# Patient Record
Sex: Male | Born: 1940 | Race: White | Hispanic: No | Marital: Married | Smoking: Former smoker
Health system: Southern US, Community
[De-identification: ages and names within clinical notes are randomized; demographics above are authoritative.]

## PROBLEM LIST (undated history)

## (undated) DIAGNOSIS — R7301 Impaired fasting glucose: Secondary | ICD-10-CM

## (undated) DIAGNOSIS — I4819 Other persistent atrial fibrillation: Secondary | ICD-10-CM

## (undated) DIAGNOSIS — K759 Inflammatory liver disease, unspecified: Secondary | ICD-10-CM

## (undated) DIAGNOSIS — G4730 Sleep apnea, unspecified: Secondary | ICD-10-CM

## (undated) DIAGNOSIS — K648 Other hemorrhoids: Secondary | ICD-10-CM

## (undated) DIAGNOSIS — D126 Benign neoplasm of colon, unspecified: Secondary | ICD-10-CM

## (undated) DIAGNOSIS — E785 Hyperlipidemia, unspecified: Secondary | ICD-10-CM

## (undated) DIAGNOSIS — M1990 Unspecified osteoarthritis, unspecified site: Secondary | ICD-10-CM

## (undated) DIAGNOSIS — I10 Essential (primary) hypertension: Secondary | ICD-10-CM

## (undated) HISTORY — DX: Essential (primary) hypertension: I10

## (undated) HISTORY — DX: Other persistent atrial fibrillation: I48.19

## (undated) HISTORY — DX: Benign neoplasm of colon, unspecified: D12.6

## (undated) HISTORY — DX: Other hemorrhoids: K64.8

## (undated) HISTORY — DX: Hyperlipidemia, unspecified: E78.5

## (undated) HISTORY — DX: Sleep apnea, unspecified: G47.30

## (undated) HISTORY — DX: Impaired fasting glucose: R73.01

---

## 1995-04-22 HISTORY — PX: NASAL SINUS SURGERY: SHX719

## 1995-04-22 HISTORY — PX: TONSILLECTOMY AND ADENOIDECTOMY: SUR1326

## 1995-04-22 HISTORY — PX: UVULOPALATOPHARYNGOPLASTY: SHX827

## 2002-06-21 ENCOUNTER — Emergency Department (HOSPITAL_COMMUNITY): Admission: EM | Admit: 2002-06-21 | Discharge: 2002-06-22 | Payer: Self-pay | Admitting: Emergency Medicine

## 2002-06-30 ENCOUNTER — Emergency Department (HOSPITAL_COMMUNITY): Admission: EM | Admit: 2002-06-30 | Discharge: 2002-06-30 | Payer: Self-pay | Admitting: *Deleted

## 2002-07-01 ENCOUNTER — Encounter: Payer: Self-pay | Admitting: Internal Medicine

## 2002-07-01 ENCOUNTER — Encounter: Admission: RE | Admit: 2002-07-01 | Discharge: 2002-07-01 | Payer: Self-pay | Admitting: Internal Medicine

## 2004-04-21 DIAGNOSIS — R7301 Impaired fasting glucose: Secondary | ICD-10-CM

## 2004-04-21 DIAGNOSIS — D126 Benign neoplasm of colon, unspecified: Secondary | ICD-10-CM

## 2004-04-21 HISTORY — PX: COLONOSCOPY W/ POLYPECTOMY: SHX1380

## 2004-04-21 HISTORY — DX: Benign neoplasm of colon, unspecified: D12.6

## 2004-04-21 HISTORY — DX: Impaired fasting glucose: R73.01

## 2004-04-30 ENCOUNTER — Ambulatory Visit: Payer: Self-pay | Admitting: Internal Medicine

## 2004-05-08 ENCOUNTER — Ambulatory Visit: Payer: Self-pay | Admitting: Internal Medicine

## 2004-05-29 ENCOUNTER — Ambulatory Visit: Payer: Self-pay | Admitting: Gastroenterology

## 2004-06-12 ENCOUNTER — Ambulatory Visit: Payer: Self-pay | Admitting: Gastroenterology

## 2005-09-17 ENCOUNTER — Ambulatory Visit: Payer: Self-pay | Admitting: Internal Medicine

## 2005-09-29 ENCOUNTER — Ambulatory Visit: Payer: Self-pay | Admitting: Internal Medicine

## 2006-11-09 ENCOUNTER — Telehealth (INDEPENDENT_AMBULATORY_CARE_PROVIDER_SITE_OTHER): Payer: Self-pay | Admitting: *Deleted

## 2007-02-02 ENCOUNTER — Ambulatory Visit: Payer: Self-pay | Admitting: Internal Medicine

## 2007-02-02 DIAGNOSIS — R7989 Other specified abnormal findings of blood chemistry: Secondary | ICD-10-CM | POA: Insufficient documentation

## 2007-02-02 DIAGNOSIS — N402 Nodular prostate without lower urinary tract symptoms: Secondary | ICD-10-CM

## 2007-02-14 LAB — CONVERTED CEMR LAB
ALT: 18 units/L (ref 0–53)
AST: 21 units/L (ref 0–37)
Albumin: 4.4 g/dL (ref 3.5–5.2)
BUN: 20 mg/dL (ref 6–23)
Basophils Absolute: 0 10*3/uL (ref 0.0–0.1)
Basophils Relative: 0.3 % (ref 0.0–1.0)
Bilirubin, Direct: 0.1 mg/dL (ref 0.0–0.3)
CO2: 30 meq/L (ref 19–32)
Calcium: 9.3 mg/dL (ref 8.4–10.5)
Chloride: 106 meq/L (ref 96–112)
Cholesterol: 190 mg/dL (ref 0–200)
Creatinine, Ser: 1.1 mg/dL (ref 0.4–1.5)
Eosinophils Absolute: 0.1 10*3/uL (ref 0.0–0.6)
Eosinophils Relative: 2.5 % (ref 0.0–5.0)
GFR calc Af Amer: 86 mL/min
GFR calc non Af Amer: 71 mL/min
Glucose, Bld: 115 mg/dL — ABNORMAL HIGH (ref 70–99)
HCT: 45.1 % (ref 39.0–52.0)
HDL: 45.2 mg/dL (ref >39.0)
Hgb A1c MFr Bld: 5.6 % (ref 4.6–6.0)
LDL Cholesterol: 126 mg/dL — ABNORMAL HIGH (ref 0–99)
Lymphocytes Relative: 30.6 % (ref 12.0–46.0)
MCHC: 34.1 g/dL (ref 30.0–36.0)
MCV: 94.9 fL (ref 78.0–100.0)
Monocytes Relative: 10 % (ref 3.0–11.0)
Neutro Abs: 3.3 10*3/uL (ref 1.4–7.7)
Neutrophils Relative %: 56.6 % (ref 43.0–77.0)
Platelets: 240 10*3/uL (ref 150–400)
Potassium: 4.3 meq/L (ref 3.5–5.1)
RBC: 4.75 M/uL (ref 4.22–5.81)
TSH: 1.01 microintl units/mL (ref 0.35–5.50)
Total Bilirubin: 0.9 mg/dL (ref 0.3–1.2)
Total CHOL/HDL Ratio: 4.2
Triglycerides: 93 mg/dL (ref 0–149)
VLDL: 19 mg/dL (ref 0–40)
WBC: 5.8 10*3/uL (ref 4.5–10.5)

## 2007-02-16 ENCOUNTER — Encounter (INDEPENDENT_AMBULATORY_CARE_PROVIDER_SITE_OTHER): Payer: Self-pay | Admitting: *Deleted

## 2007-02-21 ENCOUNTER — Encounter: Payer: Self-pay | Admitting: Internal Medicine

## 2007-02-22 ENCOUNTER — Encounter: Payer: Self-pay | Admitting: Internal Medicine

## 2007-02-24 ENCOUNTER — Telehealth: Payer: Self-pay | Admitting: Internal Medicine

## 2007-03-02 ENCOUNTER — Ambulatory Visit: Payer: Self-pay | Admitting: Internal Medicine

## 2007-03-04 ENCOUNTER — Encounter: Payer: Self-pay | Admitting: Internal Medicine

## 2007-06-07 ENCOUNTER — Ambulatory Visit: Payer: Self-pay | Admitting: Internal Medicine

## 2007-06-14 LAB — CONVERTED CEMR LAB
ALT: 23 units/L (ref 0–53)
AST: 22 units/L (ref 0–37)
Cholesterol: 142 mg/dL (ref 0–200)
HDL: 40.9 mg/dL (ref >39.0)
LDL Cholesterol: 84 mg/dL (ref 0–99)
Total CHOL/HDL Ratio: 3.5
Triglycerides: 85 mg/dL (ref 0–149)

## 2007-06-15 ENCOUNTER — Encounter (INDEPENDENT_AMBULATORY_CARE_PROVIDER_SITE_OTHER): Payer: Self-pay | Admitting: *Deleted

## 2007-07-14 ENCOUNTER — Encounter: Payer: Self-pay | Admitting: Internal Medicine

## 2007-08-26 ENCOUNTER — Encounter: Payer: Self-pay | Admitting: Internal Medicine

## 2007-10-12 ENCOUNTER — Ambulatory Visit: Payer: Self-pay | Admitting: Internal Medicine

## 2008-03-02 ENCOUNTER — Encounter: Payer: Self-pay | Admitting: Internal Medicine

## 2008-04-21 HISTORY — PX: PROSTATE BIOPSY: SHX241

## 2008-06-04 ENCOUNTER — Emergency Department (HOSPITAL_BASED_OUTPATIENT_CLINIC_OR_DEPARTMENT_OTHER): Admission: EM | Admit: 2008-06-04 | Discharge: 2008-06-04 | Payer: Self-pay | Admitting: Emergency Medicine

## 2008-09-11 ENCOUNTER — Encounter: Payer: Self-pay | Admitting: Internal Medicine

## 2008-10-05 ENCOUNTER — Ambulatory Visit: Payer: Self-pay | Admitting: Internal Medicine

## 2008-10-07 LAB — CONVERTED CEMR LAB
ALT: 22 units/L (ref 0–53)
AST: 19 units/L (ref 0–37)
Cholesterol: 157 mg/dL (ref 0–200)
HDL: 42.7 mg/dL (ref >39.00)
LDL Cholesterol: 97 mg/dL (ref 0–99)
Total CHOL/HDL Ratio: 4
Triglycerides: 88 mg/dL (ref 0.0–149.0)
VLDL: 17.6 mg/dL (ref 0.0–40.0)

## 2008-10-09 ENCOUNTER — Encounter (INDEPENDENT_AMBULATORY_CARE_PROVIDER_SITE_OTHER): Payer: Self-pay | Admitting: *Deleted

## 2008-10-10 ENCOUNTER — Ambulatory Visit: Payer: Self-pay | Admitting: Internal Medicine

## 2008-10-10 DIAGNOSIS — E785 Hyperlipidemia, unspecified: Secondary | ICD-10-CM

## 2008-10-10 DIAGNOSIS — I10 Essential (primary) hypertension: Secondary | ICD-10-CM | POA: Insufficient documentation

## 2008-10-10 DIAGNOSIS — Z86010 Personal history of colonic polyps: Secondary | ICD-10-CM

## 2008-10-10 LAB — CONVERTED CEMR LAB
Cholesterol, target level: 200 mg/dL
HDL goal, serum: 40 mg/dL
LDL Goal: 130 mg/dL

## 2008-10-11 ENCOUNTER — Encounter: Payer: Self-pay | Admitting: Internal Medicine

## 2009-03-06 ENCOUNTER — Encounter: Payer: Self-pay | Admitting: Internal Medicine

## 2009-04-21 HISTORY — PX: COLONOSCOPY: SHX174

## 2009-05-01 ENCOUNTER — Encounter (INDEPENDENT_AMBULATORY_CARE_PROVIDER_SITE_OTHER): Payer: Self-pay | Admitting: *Deleted

## 2009-05-21 ENCOUNTER — Encounter (INDEPENDENT_AMBULATORY_CARE_PROVIDER_SITE_OTHER): Payer: Self-pay | Admitting: *Deleted

## 2009-06-12 ENCOUNTER — Encounter (INDEPENDENT_AMBULATORY_CARE_PROVIDER_SITE_OTHER): Payer: Self-pay | Admitting: *Deleted

## 2009-06-14 ENCOUNTER — Ambulatory Visit: Payer: Self-pay | Admitting: Gastroenterology

## 2009-06-28 ENCOUNTER — Ambulatory Visit: Payer: Self-pay | Admitting: Gastroenterology

## 2009-11-05 ENCOUNTER — Ambulatory Visit: Payer: Self-pay | Admitting: Radiology

## 2009-11-05 ENCOUNTER — Emergency Department (HOSPITAL_BASED_OUTPATIENT_CLINIC_OR_DEPARTMENT_OTHER): Admission: EM | Admit: 2009-11-05 | Discharge: 2009-11-05 | Payer: Self-pay | Admitting: Emergency Medicine

## 2009-11-06 ENCOUNTER — Ambulatory Visit (HOSPITAL_BASED_OUTPATIENT_CLINIC_OR_DEPARTMENT_OTHER): Admission: RE | Admit: 2009-11-06 | Discharge: 2009-11-06 | Payer: Self-pay | Admitting: Orthopedic Surgery

## 2009-11-08 ENCOUNTER — Ambulatory Visit: Payer: Self-pay | Admitting: Internal Medicine

## 2010-01-14 ENCOUNTER — Ambulatory Visit: Payer: Self-pay | Admitting: Internal Medicine

## 2010-02-04 ENCOUNTER — Ambulatory Visit: Payer: Self-pay | Admitting: Internal Medicine

## 2010-03-06 ENCOUNTER — Encounter: Payer: Self-pay | Admitting: Internal Medicine

## 2010-03-29 ENCOUNTER — Encounter: Payer: Self-pay | Admitting: Internal Medicine

## 2010-03-29 ENCOUNTER — Ambulatory Visit: Payer: Self-pay | Admitting: Internal Medicine

## 2010-04-01 LAB — CONVERTED CEMR LAB
Basophils Absolute: 0 10*3/uL (ref 0.0–0.1)
Basophils Relative: 1 % (ref 0–1)
Eosinophils Absolute: 0.2 10*3/uL (ref 0.0–0.7)
Eosinophils Relative: 4 % (ref 0–5)
HCT: 41.5 % (ref 39.0–52.0)
Hemoglobin: 14.1 g/dL (ref 13.0–17.0)
MCHC: 34 g/dL (ref 30.0–36.0)
MCV: 92.8 fL (ref 78.0–100.0)
Monocytes Absolute: 0.6 10*3/uL (ref 0.1–1.0)
Platelets: 220 10*3/uL (ref 150–400)
RDW: 12.4 % (ref 11.5–15.5)

## 2010-05-19 LAB — CONVERTED CEMR LAB
ALT: 18 units/L (ref 0–53)
Albumin: 4.5 g/dL (ref 3.5–5.2)
Alkaline Phosphatase: 62 units/L (ref 39–117)
BUN: 27 mg/dL — ABNORMAL HIGH (ref 6–23)
Basophils Absolute: 0 10*3/uL (ref 0.0–0.1)
Bilirubin Urine: NEGATIVE
Bilirubin, Direct: 0.1 mg/dL (ref 0.0–0.3)
Blood in Urine, dipstick: NEGATIVE
CO2: 29 meq/L (ref 19–32)
Calcium: 9.3 mg/dL (ref 8.4–10.5)
Chloride: 104 meq/L (ref 96–112)
Cholesterol: 170 mg/dL (ref 0–200)
Creatinine, Ser: 1.2 mg/dL (ref 0.4–1.5)
Eosinophils Absolute: 0.1 10*3/uL (ref 0.0–0.7)
Eosinophils Relative: 2.6 % (ref 0.0–5.0)
GFR calc non Af Amer: 61.51 mL/min (ref >60)
Glucose, Bld: 95 mg/dL (ref 70–99)
Glucose, Urine, Semiquant: NEGATIVE
HCT: 42.1 % (ref 39.0–52.0)
HDL: 48.1 mg/dL (ref >39.00)
Hemoglobin: 14.3 g/dL (ref 13.0–17.0)
Ketones, urine, test strip: NEGATIVE
LDL Cholesterol: 110 mg/dL — ABNORMAL HIGH (ref 0–99)
Lymphocytes Relative: 51.8 % — ABNORMAL HIGH (ref 12.0–46.0)
MCHC: 34 g/dL (ref 30.0–36.0)
Monocytes Absolute: 0.6 10*3/uL (ref 0.1–1.0)
Neutro Abs: 1.2 10*3/uL — ABNORMAL LOW (ref 1.4–7.7)
Neutrophils Relative %: 29.6 % — ABNORMAL LOW (ref 43.0–77.0)
Nitrite: NEGATIVE
Platelets: 202 10*3/uL (ref 150.0–400.0)
Potassium: 4.3 meq/L (ref 3.5–5.1)
Protein, U semiquant: NEGATIVE
RBC: 4.28 M/uL (ref 4.22–5.81)
RDW: 13 % (ref 11.5–14.6)
Sodium: 139 meq/L (ref 135–145)
Specific Gravity, Urine: >=1.03
TSH: 1.3 microintl units/mL (ref 0.35–5.50)
Total Bilirubin: 0.7 mg/dL (ref 0.3–1.2)
Total CHOL/HDL Ratio: 4
Total Protein: 7 g/dL (ref 6.0–8.3)
Triglycerides: 60 mg/dL (ref 0.0–149.0)
Urobilinogen, UA: NEGATIVE
VLDL: 12 mg/dL (ref 0.0–40.0)
WBC Urine, dipstick: NEGATIVE
pH: 5

## 2010-05-21 NOTE — Assessment & Plan Note (Signed)
Summary: PNEUMONIA SHOT///SPH   Nurse Visit  CC: Pneumo vacc./kb   Allergies: No Known Drug Allergies  Immunizations Administered:  Pneumonia Vaccine:    Vaccine Type: Pneumovax    Site: left deltoid    Mfr: Merck    Dose: 0.5 ml    Route: IM    Given by: Lucious Groves CMA    Exp. Date: 07/08/2011    Lot #: [id number]    VIS given: 03/26/09 version given February 04, 2010.  Orders Added: 1)  Pneumococcal Vaccine [90732] 2)  Admin 1st Vaccine [[address]]

## 2010-05-21 NOTE — Procedures (Signed)
Summary: Colonoscopy  Patient: Daniel Tucker Note: All result statuses are Final unless otherwise noted.  Tests: (1) Colonoscopy (COL)   COL Colonoscopy           DONE     [facility]     [address]     [place], [place]  [address]           COLONOSCOPY PROCEDURE REPORT           PATIENT:  Daniel Tucker, Daniel Tucker  MR#:  [id number]     BIRTHDATE:  [birth year], 69 yrs. old  GENDER:  male           ENDOSCOPIST:  Judie Petit T. Russella Dar, MD, [facility]           PROCEDURE DATE:  06/28/2009     PROCEDURE:  Higher-risk screening colonoscopy G0105           ASA CLASS:  Class II     INDICATIONS:  1) follow-up of polyp, adenomatous polyp, 2006.     Screening.           MEDICATIONS:   Fentanyl 50 mcg IV, Versed 4 mg IV           DESCRIPTION OF PROCEDURE:   After the risks benefits and     alternatives of the procedure were thoroughly explained, informed     consent was obtained.  Digital rectal exam was performed and     revealed no abnormalities.   The LB [id number]-[id number] endoscope     was introduced through the anus and advanced to the cecum, which     was identified by both the appendix and ileocecal valve, without     limitations.  The quality of the prep was excellent, using     MoviPrep.  The instrument was then slowly withdrawn as the colon     was fully examined.     <<PROCEDUREIMAGES>>           FINDINGS:  A normal appearing cecum, ileocecal valve, and     appendiceal orifice were identified. The ascending, hepatic     flexure, transverse, splenic flexure, descending, sigmoid colon,     and rectum appeared unremarkable. Retroflexed views in the rectum     revealed no abnormalities.  The time to cecum =  1.75  minutes.     The scope was then withdrawn (time =  8.5  min) from the patient     and the procedure completed.           COMPLICATIONS:  None           ENDOSCOPIC IMPRESSION:     1) Normal colon           RECOMMENDATIONS:     1) Repeat Colonoscopy in 5 years.             Venita Lick. Russella Dar, MD, [facility]           CC: Pecola Lawless, MD           n.     Rosalie DoctorVenita Lick. Stark at 06/28/2009 11:13 AM           Edd Fabian, [id number]  Note: An exclamation mark (!) indicates a result that was not dispersed into the flowsheet. Document Creation Date: 06/28/2009 11:13 AM _______________________________________________________________________  (1) Order result status: Final Collection or observation date-time: 06/28/2009 11:10 Requested date-time:  Receipt date-time:  Reported date-time:  Referring Physician:   Ordering Physician: Claudette Head (  [id number]) Specimen Source:  Source: Launa Grill Order Number: [id number] Lab site:   Appended Document: Colonoscopy    Clinical Lists Changes  Observations: Added new observation of COLONNXTDUE: 06/2014 (06/28/2009 12:40)

## 2010-05-21 NOTE — Assessment & Plan Note (Signed)
Summary: YEARLY EXAM/flu shot/MAY COME IN FASTING///SPH   Vital Signs:  Patient profile:   70 year old male Height:      68 inches Weight:      194.6 pounds BMI:     29.70 Temp:     97.6 degrees F oral Pulse rate:   56 / minute Resp:     14 per minute BP sitting:   120 / 82  (left arm) Cuff size:   large  Vitals Entered By: Shonna Chock CMA (January 14, 2010 1:46 PM) CC: CPX with fasting labs , Lipid Management   CC:  CPX with fasting labs  and Lipid Management.  History of Present Illness: Here for Medicare AWV: 1.Risk factors based on Past M, S, F history:HTN; Dyslidemia ( Chart updated) 2.Physical Activities: yardwork, golf, stationary bike 2-3X/week 3.Depression/mood: no issues 4.Hearing:whisper heard @ 6 ft  5.ADL's: no limitations 6.Fall Risk: no risks; home safety proofed 7.Home Safety: no issues 8.Height, weight, &visual acuity:wall chart read @ 6 ft w/o lenses 9.Counseling: none requested  10.Labs ordered based on risk factors: see Orders 11. Referral Coordination: none requested 12. Care Plan: see Instructions 13. Cognitive Assessment: Oriented X3; memory & recall intact   ; "WORLD" spelled backwards; mood & affect normal. Hyperlipidemia Follow-Up      This is a 70 year old man who presents for Hyperlipidemia follow-up.  The patient denies muscle aches, GI upset, abdominal pain, flushing, itching, constipation, diarrhea, and fatigue.  The patient denies the following symptoms: chest pain/pressure, exercise intolerance, dypsnea, palpitations, syncope, and pedal edema.  Compliance with medications (by patient report) has been near 100%.  Dietary compliance has been good.  Adjunctive measures currently used by the patient include fish oil supplements.   Hypertension Follow-Up      The patient also presents for Hypertension follow-up.  The patient denies lightheadedness, urinary frequency, and headaches.  Compliance with medications (by patient report) has been near  100%.  Adjunctive measures currently used by the patient include salt restriction. BP @ home @ goal.   Lipid Management History:      Positive NCEP/ATP III risk factors include male age 13 years old or older and hypertension.  Negative NCEP/ATP III risk factors include non-diabetic, no family history for ischemic heart disease, non-tobacco-user status, no ASHD (atherosclerotic heart disease), no prior stroke/TIA, no peripheral vascular disease, and no history of aortic aneurysm.     Preventive Screening-Counseling & Management  Alcohol-Tobacco     Alcohol drinks/day: rarely     Smoking Status: quit     Packs/Day: up to 1 ppd     Year Quit: 1970  Caffeine-Diet-Exercise     Caffeine use/day: 1-2 cups/ day     Diet Comments: no specific diet  Hep-HIV-STD-Contraception     Dental Visit-last 6 months yes     Sun Exposure-Excessive: no  Safety-Violence-Falls     Seat Belt Use: yes     Firearms in the Home: firearms in the home     Firearm Counseling: not indicated; uses recommended firearm safety measures      Blood Transfusions:  no.        Travel History:  [place] in 10/2009.    Allergies (verified): No Known Drug Allergies  Past History:  Past Medical History: Sleep apnea, S/P ENT surgery (no CPAP required) Hyperlipidemia: [place] Study LDL goal = < 130. Hypertension Colonic polyps, PMH  of Prostate nodule, Dr  Vernie Ammons  Past Surgical History: Sleep apnea surgery with resolution , Dr  Bland Span Sinus surgery ,Tonsillectomy,  palate & uvula surgery as part of Apnea surgery Colon polypectomy 2006,negative   2011 Prostate biopsy negative , Dr Vernie Ammons  Family History: Father: prostate cancer, HTN Mother: HTN Siblings: bro: prostate cancer ; bro:  prostate nodule  Social History: no diet Retired Married Packs/Day:  up to 1 ppd Caffeine use/day:  1-2 cups/ day Dental Care w/in 6 mos.:  yes Sun Exposure-Excessive:  no Risk analyst Use:  yes Blood Transfusions:   no  Review of Systems  The patient denies anorexia, fever, hoarseness, prolonged cough, hemoptysis, melena, hematochezia, severe indigestion/heartburn, hematuria, suspicious skin lesions, unusual weight change, abnormal bleeding, enlarged lymph nodes, and angioedema.         Weight loss of 10 # on purpose. He sees Dr Vernie Ammons annually for prostate nodule & because of FH.  Physical Exam  General:  well-nourished,alert,appropriate and cooperative throughout examination Head:  Normocephalic and atraumatic without obvious abnormalities. No  significant alopecia Eyes:  No corneal or conjunctival inflammation noted.Perrla. Funduscopic exam benign, without hemorrhages, exudates or papilledema.  Ears:  External ear exam shows no significant lesions or deformities.  Otoscopic examination reveals clear canals, tympanic membranes are intact bilaterally without bulging, retraction, inflammation or discharge. Hearing is grossly normal bilaterally. Nose:  External nasal examination shows no deformity or inflammation. Nasal mucosa are pink and moist without lesions or exudates. Mouth:  Oral mucosa and oropharynx without lesions or exudates.  Teeth in good repair.S/Porophyngeal surgery changes posteriorly Neck:  No deformities, masses, or tenderness noted. Lungs:  Normal respiratory effort, chest expands symmetrically. Lungs are clear to auscultation, no crackles or wheezes. Heart:  regular rhythm, no murmur, no gallop, no rub, no JVD, no HJR, and bradycardia.  Click @ apex Abdomen:  Bowel sounds positive,abdomen soft and non-tender without masses, organomegaly or hernias noted. Genitalia:  Dr Vernie Ammons Prostate:  Dr Vernie Ammons Msk:  No deformity or scoliosis noted of thoracic or lumbar spine.   Pulses:  R and L carotid,radial,dorsalis pedis and posterior tibial pulses are full and equal bilaterally Extremities:  No clubbing, cyanosis, edema, or deformity noted with normal full range of motion of all joints.   Onycholysis R fingernails Neurologic:  alert & oriented X3 and DTRs symmetrical and normal.   Skin:  Intact without suspicious lesions or rashes Cervical Nodes:  No lymphadenopathy noted Axillary Nodes:  No palpable lymphadenopathy Psych:  memory intact for recent and remote, normally interactive, and good eye contact.     Impression & Recommendations:  Problem # 1:  PREVENTIVE HEALTH CARE (ICD-V70.0)  Orders: [facility] -Subsequent Annual Wellness Visit [phone])  Problem # 2:  HYPERTENSION (ICD-401.9)  Controlled His updated medication list for this problem includes:    Norvasc 5 Mg Tabs (Amlodipine besylate) .Marland Kitchen... 1 by mouth once daily    Metoprolol Tartrate 50 Mg Tabs (Metoprolol tartrate) .Marland Kitchen... Take 1/2 twice daily    Lisinopril 10 Mg Tabs (Lisinopril) .Marland Kitchen... 1 once daily if bp averages > 135/85  Orders: EKG w/ Interpretation (93000) Venipuncture (47829) TLB-BMP (Basic Metabolic Panel-BMET) (80048-METABOL)  Problem # 3:  HYPERLIPIDEMIA (ICD-272.4)  His updated medication list for this problem includes:    Pravachol 20 Mg Tabs (Pravastatin sodium) .Marland Kitchen... 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 1 by mouth once daily 2)  Acyclovir 400 Mg Tabs (Acyclovir) .... Take 1 tablet 3)  Metoprolol Tartrate 50 Mg Tabs (Metoprolol tartrate) .... Take 1/2 twice daily 4)  Vit C 1000mg  Qd  5)  Centrum Silver  6)  Glucosamine Sulfate  .... On three months and off three months 7)  Pravachol 20 Mg Tabs (Pravastatin sodium) .Marland Kitchen.. 1 at  bedtime 8)  Lisinopril 10 Mg Tabs (Lisinopril) .Marland Kitchen.. 1 once daily if bp averages > 135/85  Other Orders: Flu Vaccine 30yrs + MEDICARE PATIENTS (U0454) Administration Flu vaccine - MCR (G0008)  Lipid Assessment/Plan:      Based on NCEP/ATP III, the patient's risk factor category is "2 or more risk factors and a calculated 10 year CAD risk of < 20%".  The patient's lipid goals are as follows: Total cholesterol goal is 200; LDL cholesterol goal is 130; HDL cholesterol goal is 40; Triglyceride goal is 150.  His LDL cholesterol goal has been met.    Patient Instructions: 1)  It is important that you exercise regularly at least 20 minutes 5 times a week. If you develop chest pain, have severe difficulty breathing, or feel very tired , stop exercising immediately and seek medical attention. 2)  Take an  81 mg coated Aspirin every day. 3)  Check your Blood Pressure regularly. If it is above: 135/85 ON AVERAGE  you should make an appointment. Flu Vaccine Consent Questions     Do you have a history of severe allergic reactions to this vaccine? no    Any prior history of allergic reactions to egg and/or gelatin? no    Do you have a sensitivity to the preservative Thimersol? no    Do you have a past history of Guillan-Barre Syndrome? no    Do you currently have an acute febrile illness? no    Have you ever had a severe reaction to latex? no    Vaccine information given and explained to patient? yes    Are you currently pregnant? no    Lot Number:[id number]   Exp Date:10/19/2010   Site Given  Left Deltoid IM-CCC] Prescriptions: LISINOPRIL 10 MG TABS (LISINOPRIL) 1 once daily if BP AVERAGES > 135/85  #90 x 3   Entered and Authorized by:   Marga Melnick MD   Signed by:   Marga Melnick MD on 01/14/2010   Method used:   Print then Give to Patient   RxID:   [id number] PRAVACHOL 20 MG  TABS (PRAVASTATIN SODIUM) 1 at bedtime  #90 x 3   Entered and Authorized by:   Marga Melnick MD   Signed by:    Marga Melnick MD on 01/14/2010   Method used:   Print then Give to Patient   RxID:   [id number] METOPROLOL TARTRATE 50 MG  TABS (METOPROLOL TARTRATE) take 1/2 twice daily  #90 x 3   Entered and Authorized by:   Marga Melnick MD   Signed by:   Marga Melnick MD on 01/14/2010   Method used:   Print then Give to Patient   RxID:   [id number] ACYCLOVIR 400 MG  TABS (ACYCLOVIR) take 1 tablet  #90 x 3   Entered and Authorized by:   Marga Melnick MD   Signed by:   Marga Melnick MD on 01/14/2010   Method used:   Print then Give to Patient  RxID:   [id number] NORVASC 5 MG TABS (AMLODIPINE BESYLATE) 1 by mouth once daily  #90 x 3   Entered and Authorized by:   Marga Melnick MD   Signed by:   Marga Melnick MD on 01/14/2010   Method used:   Print then Give to Patient   RxID:   [id number]   .lbmedflu    Laboratory Results   Urine Tests   Date/Time Reported: January 14, 2010 2:09 PM   Routine Urinalysis   Color: yellow Appearance: Clear Glucose: negative   (Normal Range: Negative) Bilirubin: negative   (Normal Range: Negative) Ketone: negative   (Normal Range: Negative) Spec. Gravity: >=1.030   (Normal Range: 1.003-1.035) Blood: negative   (Normal Range: Negative) pH: 5.0   (Normal Range: 5.0-8.0) Protein: negative   (Normal Range: Negative) Urobilinogen: negative   (Normal Range: 0-1) Nitrite: negative   (Normal Range: Negative) Leukocyte Esterace: negative   (Normal Range: Negative)    Comments: Floydene Flock  January 14, 2010 2:09 PM

## 2010-05-21 NOTE — Letter (Signed)
Summary: [facility] Instructions  [facility]  [address] [place], [place] [address]   Phone: [phone]  Fax: [phone]       Daniel Tucker    [birth year]    MRN: [id number]        Procedure Day /Date:  Thursday  06/28/09     Arrival Time:  9:30am     Procedure Time:  10:30am     Location of Procedure:                    _X _  [facility] (4th Floor)                        PREPARATION FOR COLONOSCOPY WITH MOVIPREP   Starting 5 days prior to your procedure   Saturday 03/05  do not eat nuts, seeds, popcorn, corn, beans, peas,  salads, or any raw vegetables.  Do not take any fiber supplements (e.g. Metamucil, Citrucel, and Benefiber).  THE DAY BEFORE YOUR PROCEDURE         DATE:   03/09   DAY: Wednesday  1.  Drink clear liquids the entire day-NO SOLID FOOD  2.  Do not drink anything colored red or purple.  Avoid juices with pulp.  No orange juice.  3.  Drink at least 64 oz. (8 glasses) of fluid/clear liquids during the day to prevent dehydration and help the prep work efficiently.  CLEAR LIQUIDS INCLUDE: Water Jello Ice Popsicles Tea (sugar ok, no milk/cream) Powdered fruit flavored drinks Coffee (sugar ok, no milk/cream) Gatorade Juice: apple, white grape, white cranberry  Lemonade Clear bullion, consomm, broth Carbonated beverages (any kind) Strained chicken noodle soup Hard Candy                             4.  In the morning, mix first dose of MoviPrep solution:    Empty 1 Pouch A and 1 Pouch B into the disposable container    Add lukewarm drinking water to the top line of the container. Mix to dissolve    Refrigerate (mixed solution should be used within 24 hrs)  5.  Begin drinking the prep at 5:00 p.m. The MoviPrep container is divided by 4 marks.   Every 15 minutes drink the solution down to the next mark (approximately 8 oz) until the full liter is complete.   6.  Follow completed prep with 16 oz of clear liquid of  your choice (Nothing red or purple).  Continue to drink clear liquids until bedtime.  7.  Before going to bed, mix second dose of MoviPrep solution:    Empty 1 Pouch A and 1 Pouch B into the disposable container    Add lukewarm drinking water to the top line of the container. Mix to dissolve    Refrigerate  THE DAY OF YOUR PROCEDURE      DATE:  03/10  DAY: Thursday  Beginning at  5:30 a.m. (5 hours before procedure):         1. Every 15 minutes, drink the solution down to the next mark (approx 8 oz) until the full liter is complete.  2. Follow completed prep with 16 oz. of clear liquid of your choice.    3. You may drink clear liquids until  8:30am  (2 HOURS BEFORE PROCEDURE).   MEDICATION INSTRUCTIONS  Unless otherwise instructed, you should take regular prescription medications with a  small sip of water   as early as possible the morning of your procedure.           OTHER INSTRUCTIONS  You will need a responsible adult at least 69 years of age to accompany you and drive you home.   This person must remain in the waiting room during your procedure.  Wear loose fitting clothing that is easily removed.  Leave jewelry and other valuables at home.  However, you may wish to bring a book to read or  an iPod/MP3 player to listen to music as you wait for your procedure to start.  Remove all body piercing jewelry and leave at home.  Total time from sign-in until discharge is approximately 2-3 hours.  You should go home directly after your procedure and rest.  You can resume normal activities the  day after your procedure.  The day of your procedure you should not:   Drive   Make legal decisions   Operate machinery   Drink alcohol   Return to work  You will receive specific instructions about eating, activities and medications before you leave.    The above instructions have been reviewed and explained to me by   Wyona Almas RN  June 14, 2009 10:13  AM     I fully understand and can verbalize these instructions _____________________________ Date _________

## 2010-05-21 NOTE — Miscellaneous (Signed)
Summary: [facility] Previsit/prep  Clinical Lists Changes  Medications: Added new medication of MOVIPREP 100 GM  SOLR (PEG-KCL-NACL-NASULF-NA ASC-C) As per prep instructions. - Signed Rx of MOVIPREP 100 GM  SOLR (PEG-KCL-NACL-NASULF-NA ASC-C) As per prep instructions.;  #1 x 0;  Signed;  Entered by: Wyona Almas RN;  Authorized by: Meryl Dare MD [facility];  Method used: Electronically to [facility] [address], [place], [place], [place]  [address], Ph: [phone], Fax: [phone] Observations: Added new observation of ALLERGY REV: Done (06/14/2009 9:23) Added new observation of NKA: T (06/14/2009 9:23)    Prescriptions: MOVIPREP 100 GM  SOLR (PEG-KCL-NACL-NASULF-NA ASC-C) As per prep instructions.  #1 x 0   Entered by:   Wyona Almas RN   Authorized by:   Meryl Dare MD [facility]   Signed by:   Wyona Almas RN on 06/14/2009   Method used:   Electronically to        [facility] (retail)       [address]       [place], [place]  [address]       Ph: [phone]       Fax: [phone]   RxID:   [id number]

## 2010-05-21 NOTE — Letter (Signed)
Summary: Colonoscopy Letter  [facility]  [address] [place], [place] [address]   Phone: [phone]  Fax: [phone]      May 01, 2009 MRN: [id number]   VEGAS FRITZE [address] [place], [place]  [address]   Dear Mr. Shimp,   According to your medical record, it is time for you to schedule a Colonoscopy. The American Cancer Society recommends this procedure as a method to detect early colon cancer. Patients with a family history of colon cancer, or a personal history of colon polyps or inflammatory bowel disease are at increased risk.  This letter has beeen generated based on the recommendations made at the time of your procedure. If you feel that in your particular situation this may no longer apply, please contact our office.  Please call our office at [phone] to schedule this appointment or to update your records at your earliest convenience.  Thank you for cooperating with [place] to provide you with the very best care possible.   Sincerely,  Judie Petit T. Russella Dar, M.D.  [facility] Gastroenterology Division [phone]

## 2010-05-21 NOTE — Assessment & Plan Note (Signed)
Summary: tetnus /cbs   Nurse Visit   Allergies: No Known Drug Allergies  Immunizations Administered:  Tetanus Vaccine:    Vaccine Type: Tdap    Site: right deltoid    Mfr: GlaxoSmithKline    Dose: 0.5 ml    Route: IM    Given by: Shonna Chock CMA    Exp. Date: 07/14/2011    Lot #: [id number]  Orders Added: 1)  Tdap => 43yrs IM [90715] 2)  Admin 1st Vaccine [09811]

## 2010-05-21 NOTE — Letter (Signed)
Summary: Previsit letter  [facility]  [address] [place], [place] [address]   Phone: [phone]  Fax: [phone]       05/21/2009 MRN: [id number]  Daniel Tucker [address] [place], [place]  [address]  Dear Mr. [name],  Welcome to the Gastroenterology Division at [facility].    You are scheduled to see a nurse for your pre-procedure visit on 06-14-09 at 10:00a.m. on the 3rd floor at [facility], [address]  We ask that you try to arrive at our office 15 minutes prior to your appointment time to allow for check-in.  Your nurse visit will consist of discussing your medical and surgical history, your immediate family medical history, and your medications.    Please bring a complete list of all your medications or, if you prefer, bring the medication bottles and we will list them.  We will need to be aware of both prescribed and over the counter drugs.  We will need to know exact dosage information as well.  If you are on blood thinners (Coumadin, Plavix, Aggrenox, Ticlid, etc.) please call our office today/prior to your appointment, as we need to consult with your physician about holding your medication.   Please be prepared to read and sign documents such as consent forms, a financial agreement, and acknowledgement forms.  If necessary, and with your consent, a friend or relative is welcome to sit-in on the nurse visit with you.  Please bring your insurance card so that we may make a copy of it.  If your insurance requires a referral to see a specialist, please bring your referral form from your primary care physician.  No co-pay is required for this nurse visit.     If you cannot keep your appointment, please call [phone] to cancel or reschedule prior to your appointment date.  This allows [place] the opportunity to schedule an appointment for another patient in need of care.    Thank you for choosing [facility] for your medical  needs.  We appreciate the opportunity to care for you.  Please visit [place] at our website  to learn more about our practice.                     Sincerely.                                                                                                                   The Gastroenterology Division

## 2010-05-21 NOTE — Letter (Signed)
Summary: [facility]  [facility]   Imported By: Lanelle Bal 03/18/2010 14:05:26  _____________________________________________________________________  External Attachment:    Type:   Image     Comment:   External Document

## 2010-06-25 ENCOUNTER — Telehealth (INDEPENDENT_AMBULATORY_CARE_PROVIDER_SITE_OTHER): Payer: Self-pay | Admitting: *Deleted

## 2010-07-01 ENCOUNTER — Encounter: Payer: Self-pay | Admitting: Internal Medicine

## 2010-07-02 NOTE — Progress Notes (Signed)
Summary: LMOM to sched appt  3/6--pt has appt = 4/24  ---- Converted from flag ---- ---- 06/22/2010 6:37 AM, Marga Melnick MD wrote: please schedule appt (15 min) as brief pre op (op date 05/14)  recheck in next 30  days; Flag me back ; please bring BP readings. Thanks ------------------------------       Additional Follow-up for Phone Call Additional follow up Details #2::    [facility] for patient to call back and schedule 15 min appt during week of April 23-27, 2012   ----Dr Alwyn Ren wants appt to be 3 weeks before his op date of 5/14   -----******Patient needs to bring BP readings****   ..Jerolyn Shin  June 25, 2010 4:15 PM  Additional Follow-up for Phone Call Additional follow up Details #3:: Details for Additional Follow-up Action Taken: Patient has appt for Tues 4/24 at 10:00am to see Dr Alwyn Ren ---will bring BP readings, will advise Dr Alwyn Ren of this appt.Jerolyn Shin  June 26, 2010 4:27 PM

## 2010-07-09 NOTE — Letter (Signed)
Summary: Request for Pre-Operative Clearance/[facility]  Request for Pre-Operative Clearance/[facility]   Imported By: Maryln Gottron 07/04/2010 09:27:20  _____________________________________________________________________  External Attachment:    Type:   Image     Comment:   External Document

## 2010-08-10 ENCOUNTER — Encounter: Payer: Self-pay | Admitting: Internal Medicine

## 2010-08-13 ENCOUNTER — Ambulatory Visit (INDEPENDENT_AMBULATORY_CARE_PROVIDER_SITE_OTHER): Payer: Medicare Other | Admitting: Internal Medicine

## 2010-08-13 ENCOUNTER — Encounter: Payer: Self-pay | Admitting: Internal Medicine

## 2010-08-13 VITALS — BP 138/82 | HR 60 | Temp 98.5°F | Resp 14 | Ht 68.5 in | Wt 196.2 lb

## 2010-08-13 DIAGNOSIS — Z01818 Encounter for other preprocedural examination: Secondary | ICD-10-CM

## 2010-08-13 DIAGNOSIS — M1610 Unilateral primary osteoarthritis, unspecified hip: Secondary | ICD-10-CM

## 2010-08-13 DIAGNOSIS — M169 Osteoarthritis of hip, unspecified: Secondary | ICD-10-CM

## 2010-08-13 DIAGNOSIS — E785 Hyperlipidemia, unspecified: Secondary | ICD-10-CM

## 2010-08-13 DIAGNOSIS — I10 Essential (primary) hypertension: Secondary | ICD-10-CM

## 2010-08-13 LAB — POTASSIUM: Potassium: 4 mEq/L (ref 3.5–5.1)

## 2010-08-13 NOTE — Progress Notes (Signed)
Subjective:    Patient ID: Daniel Tucker, male    DOB: 05/29/1940, 70 y.o.   MRN: [id number]  HPI Dr Despina Hick plans L THR for advanced DJD ( "no cartilage") 09/02/2010. He will occasionally awaken with pain in the left hip; the pain is present whenever he tries to ambulate and prevents exercise on a routine basis. Severity: up to 8 on 10 scale Pain is described as: dull, aching  Worse with: standing in addition to walking   Better with: NASIDS with partial relief  Pain radiates to: no radiation.    Impaired range of motion: yes due to pain   History of overuse or hyperextension:  no  History of trauma:  no   Associated Back Pain:  no  Numbness/tingling:  no  Weakness:  no  Red Flags Fever:  no  Bowel/bladder dysfunction:  no   CHRONIC HYPERTENSION  Disease Monitoring  Blood pressure range: 135/85 on average  Chest pain: no   Dyspnea: no   Claudication: no   Medication compliance: yes  Medication Side Effects  Lightheadedness: yes   Urinary frequency: no   Edema: no   Preventitive Healthcare:  Exercise: no, due to hip   Diet Pattern: no specific diet  Salt Restriction: no added salt. BUN 27, creat 1.2 , K+ 4.3 in 09/11  Dyslipidemia assessment: Lab results  12/2009  reviewed:    LDL 110, HDL 48, TG 60.                                                                               Family history of premature CAD/ MI: no                                                                                Diabetes : no   Smoking : quit in his 20s       Weight :  stable               ROS: fatigue: no ;  abd pain/bowel changes: no ; myalgias:no;  syncope : no ; memory loss: no;skin changes: no   Review of Systems see above; no active health issues @ present     Objective:   Physical Exam Gen.: Healthy and well-nourished in appearance. Alert, appropriate and cooperative throughout exam. Head: Normocephalic without obvious abnormalities Eyes: No corneal or conjunctival inflammation  noted. Pupils equal round reactive to light and accommodation. Fundal exam is benign without hemorrhages, exudate, papilledema. Extraocular motion intact. Vision grossly normal. Neck: No deformities, masses, or tenderness noted. Range of motion normal. Thyroid normal. Lungs: Normal respiratory effort; chest expands symmetrically. Lungs are clear to auscultation without rales, wheezes, or increased work of breathing. Heart:Slow rate and rhythm. Normal S1 and S2. No gallop, click, or rub. No murmur. Abdomen: Bowel sounds normal; abdomen soft and nontender. No masses, organomegaly or hernias noted. Musculoskeletal/extremities: No deformity or  scoliosis noted of  the thoracic or lumbar spine but mild asymmetry of thoracic musculature. No clubbing, cyanosis, edema, or deformity noted. Range of motion  Normal but pain with L hip ROM .Tone & strength  normal.Joints normal. Chronic R hand nail changes. Vascular: Carotid, radial artery, dorsalis pedis and dorsalis posterior tibial pulses are full and equal. No bruits present. Neurologic: Alert and oriented x3. Deep tendon reflexes symmetrical and normal.         Skin: Intact without suspicious lesions or rashes. Psych: Mood and affect are normal. Normally interactive                                                                                         Assessment & Plan:  #1 advanced degenerative disease left hip; surgical intervention planned.  #2 hypertension, adequate control. He has been instructed if the blood pressure averages greater than 135/85 to initiate ACE inhibitor which was prescribed  #3 dyslipidemia; values are at goal  #4 right bundle branch block with minor NS ST-T  but  no ischemic EKG changes.  Plan: BUN, creatinine, and potassium.  At this time there appears be no medical contraindication to planned surgery.

## 2010-08-13 NOTE — Patient Instructions (Signed)
Please monitor your blood pressure; if the average is greater than 135 of 85, please add the ACE inhibitor which was prescribed previously.  You are medically cleared for surgery; please share this record and labs and EKG with the Orthopedic PA at the preop evaluation.

## 2010-08-20 HISTORY — PX: TOTAL HIP ARTHROPLASTY: SHX124

## 2010-08-26 ENCOUNTER — Other Ambulatory Visit: Payer: Self-pay | Admitting: Orthopedic Surgery

## 2010-08-26 ENCOUNTER — Ambulatory Visit (HOSPITAL_COMMUNITY)
Admission: RE | Admit: 2010-08-26 | Discharge: 2010-08-26 | Disposition: A | Discharge status: Home / Self Care | Payer: Medicare Other | Source: Ambulatory Visit | Attending: Orthopedic Surgery | Admitting: Orthopedic Surgery

## 2010-08-26 ENCOUNTER — Other Ambulatory Visit (HOSPITAL_COMMUNITY): Payer: Self-pay | Admitting: Orthopedic Surgery

## 2010-08-26 ENCOUNTER — Encounter (HOSPITAL_COMMUNITY): Payer: Medicare Other

## 2010-08-26 DIAGNOSIS — Z01812 Encounter for preprocedural laboratory examination: Secondary | ICD-10-CM | POA: Insufficient documentation

## 2010-08-26 DIAGNOSIS — Z01811 Encounter for preprocedural respiratory examination: Secondary | ICD-10-CM | POA: Insufficient documentation

## 2010-08-26 DIAGNOSIS — Z01818 Encounter for other preprocedural examination: Secondary | ICD-10-CM

## 2010-08-26 LAB — COMPREHENSIVE METABOLIC PANEL
AST: 20 U/L (ref 0–37)
BUN: 21 mg/dL (ref 6–23)
CO2: 27 mEq/L (ref 19–32)
Calcium: 9.6 mg/dL (ref 8.4–10.5)
Creatinine, Ser: 1 mg/dL (ref 0.4–1.5)
GFR calc Af Amer: >60 mL/min (ref >60)
GFR calc non Af Amer: >60 mL/min (ref >60)
Glucose, Bld: 99 mg/dL (ref 70–99)

## 2010-08-26 LAB — URINALYSIS, ROUTINE W REFLEX MICROSCOPIC
Bilirubin Urine: NEGATIVE
Glucose, UA: 250 mg/dL — AB
Hgb urine dipstick: NEGATIVE
Protein, ur: NEGATIVE mg/dL
Specific Gravity, Urine: 1.022 (ref 1.005–1.030)

## 2010-08-26 LAB — PROTIME-INR
INR: 0.94 (ref 0.00–1.49)
Prothrombin Time: 12.8 seconds (ref 11.6–15.2)

## 2010-08-26 LAB — CBC
HCT: 42.1 % (ref 39.0–52.0)
Hemoglobin: 14.2 g/dL (ref 13.0–17.0)
MCHC: 33.7 g/dL (ref 30.0–36.0)
MCV: 93.6 fL (ref 78.0–100.0)
RDW: 12.2 % (ref 11.5–15.5)

## 2010-08-26 LAB — SURGICAL PCR SCREEN
MRSA, PCR: NEGATIVE
Staphylococcus aureus: NEGATIVE

## 2010-09-02 ENCOUNTER — Inpatient Hospital Stay (HOSPITAL_COMMUNITY)
Admission: RE | Admit: 2010-09-02 | Discharge: 2010-09-05 | DRG: 470 | Disposition: A | Discharge status: Home Health | Payer: Medicare Other | Source: Ambulatory Visit | Attending: Orthopedic Surgery | Admitting: Orthopedic Surgery

## 2010-09-02 ENCOUNTER — Inpatient Hospital Stay (HOSPITAL_COMMUNITY): Payer: Medicare Other

## 2010-09-02 DIAGNOSIS — E785 Hyperlipidemia, unspecified: Secondary | ICD-10-CM | POA: Diagnosis present

## 2010-09-02 DIAGNOSIS — Z01812 Encounter for preprocedural laboratory examination: Secondary | ICD-10-CM

## 2010-09-02 DIAGNOSIS — I10 Essential (primary) hypertension: Secondary | ICD-10-CM | POA: Diagnosis present

## 2010-09-02 DIAGNOSIS — M1610 Unilateral primary osteoarthritis, unspecified hip: Principal | ICD-10-CM | POA: Diagnosis present

## 2010-09-02 DIAGNOSIS — M169 Osteoarthritis of hip, unspecified: Principal | ICD-10-CM | POA: Diagnosis present

## 2010-09-02 LAB — TYPE AND SCREEN
ABO/RH(D): A POS
Antibody Screen: NEGATIVE

## 2010-09-03 LAB — BASIC METABOLIC PANEL
Calcium: 7.9 mg/dL — ABNORMAL LOW (ref 8.4–10.5)
GFR calc Af Amer: >60 mL/min (ref >60)
GFR calc non Af Amer: >60 mL/min (ref >60)
Sodium: 133 mEq/L — ABNORMAL LOW (ref 135–145)

## 2010-09-03 LAB — CBC
MCHC: 34.3 g/dL (ref 30.0–36.0)
Platelets: 168 10*3/uL (ref 150–400)
RDW: 12.2 % (ref 11.5–15.5)

## 2010-09-04 LAB — BASIC METABOLIC PANEL
CO2: 29 mEq/L (ref 19–32)
Calcium: 8.1 mg/dL — ABNORMAL LOW (ref 8.4–10.5)
Chloride: 100 mEq/L (ref 96–112)
GFR calc Af Amer: >60 mL/min (ref >60)
Sodium: 134 mEq/L — ABNORMAL LOW (ref 135–145)

## 2010-09-04 LAB — CBC
Hemoglobin: 10.4 g/dL — ABNORMAL LOW (ref 13.0–17.0)
MCHC: 33.4 g/dL (ref 30.0–36.0)
RBC: 3.27 MIL/uL — ABNORMAL LOW (ref 4.22–5.81)

## 2010-09-04 NOTE — Op Note (Signed)
NAME:  Daniel Tucker, Daniel Tucker NO.:  [id number]  MEDICAL RECORD NO.:  [id number]           PATIENT TYPE:  I  LOCATION:  1613                         FACILITY:  [facility]  PHYSICIAN:  Ollen Gross, M.D.    DATE OF BIRTH:  Jun 02, 1940  DATE OF PROCEDURE:  09/02/2010 DATE OF DISCHARGE:                              OPERATIVE REPORT   PREOPERATIVE DIAGNOSIS:  Osteoarthritis, left hip.  POSTOPERATIVE DIAGNOSIS:  Osteoarthritis, left hip.  PROCEDURE:  Left total hip arthroplasty.  SURGEON:  Ollen Gross, MD  ASSISTANT:  Alexzandrew L. Julien Girt, PA-C  ANESTHESIA:  General.  ESTIMATED BLOOD LOSS:  600.  DRAIN:  Hemovac x1.  COMPLICATIONS:  None.  CONDITION:  Stable to Recovery.  BRIEF CLINICAL NOTE:  Daniel Tucker is a 70 year old male with severe advanced osteoarthritis of the left hip with progressively worsening pain and dysfunction.  He has failed nonoperative management and presents for left total hip arthroplasty.  PROCEDURE IN DETAIL:  After successful administration of general anesthetic, the patient was placed in a right lateral decubitus position with the left side up, and held with a hip positioner.  The left lower extremity was isolated from its perineum with plastic drapes and prepped and draped in usual sterile fashion.  Short posterolateral incision was made with a 10 blade through subcutaneous tissue to the level of fascia lata which was incised in line with the skin incision.  The sciatic nerve was palpated and protected and short external rotators were isolated off the femur with the capsule.  Hip was then dislocated and the center of femoral head was marked.  A trial prosthesis was placed such that the center of the trial head corresponds to the center of his native femoral head.  Osteotomy line was marked on the femoral neck and osteotomy made with an oscillating saw.  The femoral head was removed and the femoral retractors were placed to gain  access to the canal.  The starter reamer was passed to the femoral canal.  The canal was then thoroughly irrigated with saline to remove fatty contents.  Axial reaming was then performed to 13.5 mm, proximal reaming to 18D and the sleeve machined to a large.  An 18D large trial sleeve was placed.  The femur was then retracted anteriorly to gain acetabular exposure. Acetabular retractors were placed and labrum and osteophytes removed. Acetabular reaming was performed to 53 mm for placement of a 54 mm Pinnacle acetabular shell.  He was placed in anatomic position and transfixed with 2 dome screws.  Apex hole eliminator was placed and then the 36 mm neutral +4 Marathon liner was placed.  Femoral trial was then placed which was an 18 x 13 with a 36 +12 neck matching native anteversion.  A 36 +0 head was placed.  The hip was reduced with outstanding stability.  There was full extension, full external rotation, 70 degrees flexion, 40 degrees adduction, 90 degrees internal rotation, 90 degrees of flexion, and 70 degrees of internal rotation. By placing the left leg on top of the right, I felt as though the leg lengths were equal.  The hip was then dislocated.  All trials were removed.  The permanent 18D large sleeve was then placed with 18 x 13 stem and 36 +12 neck matching native anteversion.  The 36 +0 ceramic head was then placed.  The hip was then reduced with the same stability parameters.  The wound was then copiously irrigated with saline solution and short rotators and capsule reattached to the femur through drill holes with Ethibond suture.  Fascia lata was closed over Hemovac drain with interrupted #1 Vicryl, subcutaneous closed with #1-0 and #2-0 Vicryl, and subcuticular with running 4-0 Monocryl.  The incision was then cleaned and dried and the catheter for the Marcaine pain pump was placed and the pump was initiated.  Steri-Strips and a bulky sterile dressing were applied and he  was placed into a knee immobilizer, awakened, and transported to Recovery in stable condition.     Ollen Gross, M.D.     FA/MEDQ  D:  09/02/2010  T:  09/03/2010  Job:  [id number]  Electronically Signed by Ollen Gross M.D. on 09/04/2010 10:13:55 AM

## 2010-09-05 LAB — BASIC METABOLIC PANEL
CO2: 29 mEq/L (ref 19–32)
Chloride: 98 mEq/L (ref 96–112)
Creatinine, Ser: 0.95 mg/dL (ref 0.4–1.5)
GFR calc Af Amer: >60 mL/min (ref >60)
Potassium: 3.3 mEq/L — ABNORMAL LOW (ref 3.5–5.1)

## 2010-09-05 LAB — CBC
Hemoglobin: 10.1 g/dL — ABNORMAL LOW (ref 13.0–17.0)
MCH: 31.5 pg (ref 26.0–34.0)
RBC: 3.21 MIL/uL — ABNORMAL LOW (ref 4.22–5.81)
WBC: 9.5 10*3/uL (ref 4.0–10.5)

## 2010-09-11 NOTE — H&P (Signed)
NAME:  Daniel Tucker, Daniel Tucker NO.:  [id number]  MEDICAL RECORD NO.:  [id number]           PATIENT TYPE:  I  LOCATION:  X001                         FACILITY:  [facility]  PHYSICIAN:  Ollen Gross, M.D.    DATE OF BIRTH:  04/22/1940  DATE OF ADMISSION:  09/02/2010 DATE OF DISCHARGE:                             HISTORY & PHYSICAL   CHIEF COMPLAINT:  Left hip pain.  HISTORY OF PRESENT ILLNESS:  The patient is 70-year male who has been seen by Dr. Lequita Halt for ongoing left hip pain.  He has known arthritis and it has progressively gotten worse.  He is a husband of a previous patient Dr. Lequita Halt.  He has been seen in the office where x-rays showed advanced end-stage arthritis with bone-on-bone large osteophyte formation.  It is felt he would benefit from undergoing surgical intervention.  Risks and benefits have been discussed.  He elects to proceed with surgery.  He has been seen preoperatively and felt to be stable for upcoming procedure.  ALLERGIES:  No known drug allergies.  CURRENT MEDICATIONS:  Amlodipine, metoprolol, acyclovir, pravastatin, baby aspirin, multivitamin, vitamin C, ibuprofen, fish oil, glucosamine.  PAST MEDICAL HISTORY: 1. Degenerative joint disease, left hip. 2. Hypertension. 3. Dyslipidemia. 4. Right bundle-branch block. 5. Hemorrhoids. 6. Childhood illnesses of jaundice. 7. Childhood illnesses of measles and mumps.  PAST SURGICAL HISTORY:  Uvulopalatopharyngoplasty,  nasal surgery, tonsillectomy, colonoscopy with polypectomy, and prostate biopsy.  FAMILY HISTORY:  Father deceased at age 38 of history of prostate cancer but also brain tumor secondary to a fall.  Mother living with hypertension.  Brother deceased at age 26 with prostate cancer.  He has 4 other siblings living, age 57, age 30, age 76 and age 70.  SOCIAL HISTORY:  Married, retired, past smoker, no alcohol.  Has three children, age is 88, 31 and 29.  He does have a caregiver  lined up.  No steps entering his home.  REVIEW OF SYSTEMS:  GENERAL:  No fevers, chills, night sweats.  NEURO: No seizures, syncope or paralysis.  RESPIRATORY:  No shortness breath, productive cough or hemoptysis.  CARDIOVASCULAR:  No chest pain, angina, orthopnea.  GI:  No nausea, vomiting, diarrhea, or constipation.  GU: No dysuria, hematuria, or discharge.  MUSCULOSKELETAL:  Hip pain.  PHYSICAL EXAMINATION:  VITAL SIGNS:  Pulse 56, respirations 12, blood pressure 156/82. GENERAL:  A 70 year old white male, well nourished, well developed, no acute distress.  He is alert, oriented, cooperative, good historian. HEENT:  Normocephalic, atraumatic.  Pupils round and reactive.  EOMs intact. NECK:  Supple. Chest:  Clear anterior and posterior chest wall. HEART:  Regular rate and rhythm without murmur.  S1, S2 noted. ABDOMEN:  Soft, nontender.  Bowel sounds present. RECTAL:  Not done, not pertinent to present illness. BREASTS:  Not done, not pertinent to present illness. GENITALIA:  Not done, not pertinent to present illness. EXTREMITIES:  Left hip flexion 90, 0 internal rotation, 20 degrees external rotation, 20 degrees abduction.  Does ambulate with an antalgic gait.  IMPRESSION:  Osteoarthritis left hip.  PLAN:  The patient admitted to [facility], undergo a left  total hip replacement arthroplasty.  Surgery will be performed by Dr. Ollen Gross.     Alexzandrew L. Julien Girt, P.A.C.   ______________________________ Ollen Gross, M.D.    ALP/MEDQ  D:  09/02/2010  T:  09/02/2010  Job:  [id number]  cc:   Titus Dubin. Alwyn Ren, MD,FACP,FCCP [phone] [place] [address]  Electronically Signed by Patrica Duel P.A.C. on 09/05/2010 10:39:13 AM Electronically Signed by Ollen Gross M.D. on 09/11/2010 12:54:55 PM

## 2010-10-02 NOTE — Discharge Summary (Signed)
NAME:  [name], [name] NO.:  [id number]  MEDICAL RECORD NO.:  [id number]           PATIENT TYPE:  I  LOCATION:  1613                         FACILITY:  [facility]  PHYSICIAN:  Ollen Gross, M.D.    DATE OF BIRTH:  Jun 29, 1940  DATE OF ADMISSION:  09/02/2010 DATE OF DISCHARGE:  09/05/2010                              DISCHARGE SUMMARY   ADMITTING DIAGNOSES: 1. Osteoarthritis of the left hip. 2. Hypertension. 3. Dyslipidemia. 4. Right bundle branch block. 5. Hemorrhoids. 6. Childhood illnesses of jaundice. 7. Childhood illnesses of measles and mumps.  DISCHARGE DIAGNOSES: 1. Osteoarthritis of the left hip status post left total-hip     replacement arthroplasty. 2. Postoperative hyponatremia. 3. Postoperative hypokalemia. 4. Hypertension. 5. Dyslipidemia. 6. Right bundle branch block. 7. Hemorrhoids. 8. Childhood illnesses of jaundice. 9. Childhood illnesses of measles and mumps.  PROCEDURE:  Sep 02, 2010, left total-hip.  SURGEON:  Ollen Gross, M.D.  ASSISTANT:  Alexzandrew L. Perkins, P.A.C.  ANESTHESIA:  General.  CONSULTATIONS:  None.  BRIEF HISTORY:  Daniel Tucker is a 70 year old male with severe end-stage arthritis of the left hip with progressive worsening pain and dysfunction.  He has failed nonoperative management and now presents for a total-hip arthroplasty.  LABORATORY DATA:  Preoperative CBC was not scanned into the chart, but the postoperative hemoglobin was 11.8.  It drifted down to 10.4.  Last noted H and H was 10.1 and 30.2.  Chemistry panel was not found in the chart and not available at the time of dictation.  Serial BMETs were followed.  Sodium was 133 postoperative.  It came up to 134 and back down to 132.  Potassium dropped from 4.0 to 3.3.  The remaining electrolytes remained within normal limits.  Glucose went up to 140 and back down to 113.  X-RAYS:  Left hip him and pelvis postoperative shows status post a  left total-hip arthroplasty.  HOSPITAL COURSE:  The patient was admitted to [facility], taken to the OR, and underwent the above-stated procedure without complication.  The patient tolerated the procedure well and was later transferred to the recovery room on the orthopedic floor.  He was started on p.o. and IV analgesics for pain control following surgery. He was given 24 hours postoperative IV antibiotics.  He was started on Xarelto for DVT prophylaxis.  He had excellent urinary output.  He was started back on all home medications.  Sodium was a little low.  Felt to be a dilutional component, but diuresing fluids well.  Started getting up out of bed and allowed to be partial weightbearing 25% to 50%.  By day #2, the patient was progressing well with therapy and felt to be getting ready to go home in the next day or so.  Dressing was changed and the incision looked good.  Sodium was back up to 134.  By day #3, the patient was doing well, meeting goals and discharged home.  DISCHARGE PLAN: 1. The patient was discharged to home on Sep 05, 2010. 2. Discharge diagnoses:  Please see above. 3. Discharge medications:  Vicodin, Robaxin, Xarelto.  Continue home  medications of acyclovir, amlodipine, aspirin, metoprolol, and     pravastatin.  DIET:  Heart-healthy diet.  FOLLOWUP:  Follow up in two weeks.  DISPOSITION:  Home.  CONDITION UPON DISCHARGE:  Improved.     Alexzandrew L. Julien Girt, P.A.C.   ______________________________ Ollen Gross, M.D.    ALP/MEDQ  D:  09/19/2010  T:  09/19/2010  Job:  [id number]  cc:   Titus Dubin. Alwyn Ren, MD,FACP,FCCP [phone] [place] [address]  Electronically Signed by Patrica Duel P.A.C. on 09/26/2010 10:42:40 AM Electronically Signed by Ollen Gross M.D. on 10/02/2010 03:23:15 PM

## 2011-01-13 ENCOUNTER — Other Ambulatory Visit: Payer: Self-pay | Admitting: Internal Medicine

## 2011-02-14 ENCOUNTER — Other Ambulatory Visit: Payer: Self-pay | Admitting: Internal Medicine

## 2011-03-18 ENCOUNTER — Ambulatory Visit (INDEPENDENT_AMBULATORY_CARE_PROVIDER_SITE_OTHER): Payer: Medicare Other

## 2011-03-18 DIAGNOSIS — Z23 Encounter for immunization: Secondary | ICD-10-CM

## 2011-05-15 ENCOUNTER — Ambulatory Visit (INDEPENDENT_AMBULATORY_CARE_PROVIDER_SITE_OTHER): Payer: Medicare Other | Admitting: Internal Medicine

## 2011-05-15 ENCOUNTER — Encounter: Payer: Self-pay | Admitting: Internal Medicine

## 2011-05-15 DIAGNOSIS — Z86010 Personal history of colonic polyps: Secondary | ICD-10-CM | POA: Diagnosis not present

## 2011-05-15 DIAGNOSIS — N402 Nodular prostate without lower urinary tract symptoms: Secondary | ICD-10-CM | POA: Diagnosis not present

## 2011-05-15 DIAGNOSIS — Z0000 Encounter for general adult medical examination without abnormal findings: Secondary | ICD-10-CM | POA: Diagnosis not present

## 2011-05-15 DIAGNOSIS — E785 Hyperlipidemia, unspecified: Secondary | ICD-10-CM

## 2011-05-15 DIAGNOSIS — I10 Essential (primary) hypertension: Secondary | ICD-10-CM | POA: Diagnosis not present

## 2011-05-15 DIAGNOSIS — R9431 Abnormal electrocardiogram [ECG] [EKG]: Secondary | ICD-10-CM | POA: Insufficient documentation

## 2011-05-15 LAB — CBC WITH DIFFERENTIAL/PLATELET
Basophils Absolute: 0 10*3/uL (ref 0.0–0.1)
Eosinophils Absolute: 0.1 10*3/uL (ref 0.0–0.7)
HCT: 44.4 % (ref 39.0–52.0)
Hemoglobin: 15.1 g/dL (ref 13.0–17.0)
Lymphs Abs: 1.7 10*3/uL (ref 0.7–4.0)
MCHC: 33.9 g/dL (ref 30.0–36.0)
MCV: 96.9 fl (ref 78.0–100.0)
Monocytes Absolute: 0.5 10*3/uL (ref 0.1–1.0)
Neutro Abs: 3.3 10*3/uL (ref 1.4–7.7)
Platelets: 207 10*3/uL (ref 150.0–400.0)
RDW: 13.6 % (ref 11.5–14.6)

## 2011-05-15 LAB — BASIC METABOLIC PANEL
BUN: 20 mg/dL (ref 6–23)
CO2: 28 mEq/L (ref 19–32)
Chloride: 105 mEq/L (ref 96–112)
GFR: 61.84 mL/min (ref >60.00)
Glucose, Bld: 108 mg/dL — ABNORMAL HIGH (ref 70–99)
Potassium: 4.2 mEq/L (ref 3.5–5.1)

## 2011-05-15 LAB — HEPATIC FUNCTION PANEL
Albumin: 4.3 g/dL (ref 3.5–5.2)
Total Bilirubin: 0.7 mg/dL (ref 0.3–1.2)

## 2011-05-15 LAB — PSA: PSA: 0.42 ng/mL (ref 0.10–4.00)

## 2011-05-15 LAB — LIPID PANEL
Cholesterol: 162 mg/dL (ref 0–200)
LDL Cholesterol: 97 mg/dL (ref 0–99)
Triglycerides: 68 mg/dL (ref 0.0–149.0)
VLDL: 13.6 mg/dL (ref 0.0–40.0)

## 2011-05-15 NOTE — Progress Notes (Signed)
Subjective:    Patient ID: Daniel Tucker, male    DOB: 1940/05/30, 71 y.o.   MRN: [id number]  HPI Medicare Wellness Visit:  The following psychosocial & medical history were reviewed as required by Medicare.   Social history: caffeine:2 cups coffee/ day , alcohol:  rarely ,  tobacco use : quit 1970 & exercise : walking & bike 7X/week for 30-60 min.   Home & personal  safety / fall risk: no issues, activities of daily living: no limitations , seatbelt use : yes , and smoke alarm employment : yes .  Power of Attorney/Living Will status : needed  Vision ( as recorded per Nurse) & Hearing  evaluation :  Ophth exam overdue; whisper heard @6  ft. Orientation oriented x 3 , memory & recall :good, spelling or math testing: good,and mood & affect : normal . Depression / anxiety: denied Travel history : last 2011 [place] , immunization status : Shingles ? Contraindicated due to wife's immune compromise , transfusion history:  no, and preventive health surveillance ( colonoscopies, BMD , etc as per protocol/ [facility]): colonoscopy up to date, Dental care:  Seen every 6 mos . Chart reviewed &  Updated. Active issues reviewed & addressed.       Review of Systems HYPERTENSION: Disease Monitoring: Blood pressure range-130s/70-80s  Chest pain, palpitations- no       Dyspnea-no Medications: Compliance- yes  Lightheadedness,Syncope- no   Edema- no  FASTING HYPERGLYCEMIA, PMH of: Polyuria/phagia/dipsia- no      Visual problems- no  HYPERLIPIDEMIA: Disease Monitoring: See symptoms for Hypertension Medications: Compliance- yes Abd pain, bowel changes-no Muscle aches- no      Objective:   Physical Exam Gen.: Healthy and well-nourished in appearance. Alert, appropriate and cooperative throughout exam. Head: Normocephalic without obvious abnormalities  Eyes: No corneal or conjunctival inflammation noted. Pupils equal round reactive to light and accommodation.  Extraocular motion intact. Vision  grossly normal. Ears: External  ear exam reveals no significant lesions or deformities. Canals clear .TMs normal.  Nose: External nasal exam reveals no deformity or inflammation. Nasal mucosa are pink and moist. No lesions or exudates noted.   Mouth: Oral mucosa and oropharynx reveal no lesions or exudates. Teeth in good repair. Neck: No deformities, masses, or tenderness noted. Range of motion & Thyroid normal Lungs: Normal respiratory effort; chest expands symmetrically. Lungs are clear to auscultation without rales, wheezes, or increased work of breathing. Heart: Slow rate and regular rhythm. Normal S1 and S2. No gallop,  or rub. Soft click suggested at the apex; no murmur. Abdomen: Bowel sounds normal; abdomen soft and nontender. No masses, organomegaly or hernias noted. Genitalia/DRE: Genital exam is unremarkable. The prostate is not enlarged but there is a small nodular change on the left. There is no significant induration.                                                                                   Musculoskeletal/extremities: No deformity or scoliosis noted of  the thoracic or lumbar spine; but right paraspinous muscles are larger than the left suggesting possible subclinical scoliosis. No clubbing, cyanosis, edema, or deformity noted. Range of motion  normal .Tone & strength  normal.Joints normal. Nails reveal some chronic fungal changes of the right hand. Vascular: Carotid, radial artery, dorsalis pedis and  posterior tibial pulses are full and equal. No bruits present. Neurologic: Alert and oriented x3. Deep tendon reflexes symmetrical and normal.         Skin: Intact without suspicious lesions or rashes. Lymph: No cervical, axillary, or inguinal lymphadenopathy present. Psych: Mood and affect are normal. Normally interactive                                                                                          Assessment & Plan:  #1 Medicare Wellness Exam; criteria met ;  data entered #2 Problem List reviewed ; Assessment/ Recommendations made Plan: see Orders

## 2011-05-15 NOTE — Patient Instructions (Signed)
.  Share results with all MDs seen.  Blood Pressure Goal  Ideally is an AVERAGE < 135/85. This AVERAGE should be calculated from @ least 5-7 BP readings taken @ different times of day on different days of week. You should not respond to isolated BP readings , but rather the AVERAGE for that week  Exercise at least 30-45 minutes a day,  3-4 days a week.  Eat a low-fat diet with lots of fruits and vegetables, up to 7-9 servings per day.Consume less than 40 grams of sugar per day from foods & drinks with High Fructose Corn Sugar as # 1,2,3 or # 4 on label. Health Care Power of Attorney & Living Will. Complete if not in place ; these place you in charge of your health care decisions.

## 2011-05-15 NOTE — Assessment & Plan Note (Signed)
He is compliant with his statin; he has had no adverse effects. Labs will be checked.

## 2011-05-15 NOTE — Assessment & Plan Note (Signed)
Small nodule in the left lobe; PSA requested

## 2011-05-15 NOTE — Assessment & Plan Note (Signed)
Blood pressure is not well controlled at home. BMET will be checked.

## 2011-05-20 ENCOUNTER — Other Ambulatory Visit: Payer: Self-pay | Admitting: Internal Medicine

## 2011-06-03 ENCOUNTER — Other Ambulatory Visit: Payer: Self-pay | Admitting: Internal Medicine

## 2011-06-04 ENCOUNTER — Telehealth: Payer: Self-pay

## 2011-06-04 NOTE — Telephone Encounter (Signed)
Labs mailed to patient comments below were hand-written by Dr.Hopper

## 2011-06-04 NOTE — Telephone Encounter (Signed)
Message copied by Maurice Small on Wed Jun 04, 2011  3:52 PM ------      Message from: Pecola Lawless      Created: Wed Jun 04, 2011  2:57 PM       The fasting glucose is minimally elevated. Check a A1c , the Diabetes screening test with next lab draw.       All other labs are excellent.Hopp                   ----- Message -----         From: Stephan Minister, CMA         Sent: 06/03/2011   5:43 PM           To: Pecola Lawless, MD            Please address labs done on 05/15/11

## 2011-06-18 ENCOUNTER — Other Ambulatory Visit: Payer: Self-pay | Admitting: Internal Medicine

## 2011-07-03 ENCOUNTER — Telehealth: Payer: Self-pay | Admitting: Internal Medicine

## 2011-07-03 MED ORDER — AMLODIPINE BESYLATE 5 MG PO TABS: ORAL_TABLET | ORAL | Status: DC

## 2011-07-03 NOTE — Telephone Encounter (Signed)
Refill for  NORVASC 5 MG TAB TAKE ONE TABLET BY MOUTH EVERY DAY Qty 90 Last fill 2.12.13

## 2011-07-03 NOTE — Telephone Encounter (Signed)
Rx sent 

## 2011-10-30 DIAGNOSIS — M169 Osteoarthritis of hip, unspecified: Secondary | ICD-10-CM | POA: Diagnosis not present

## 2011-10-30 DIAGNOSIS — M25559 Pain in unspecified hip: Secondary | ICD-10-CM | POA: Diagnosis not present

## 2011-11-17 ENCOUNTER — Other Ambulatory Visit: Payer: Self-pay | Admitting: Internal Medicine

## 2012-01-05 ENCOUNTER — Other Ambulatory Visit: Payer: Self-pay | Admitting: Internal Medicine

## 2012-01-05 MED ORDER — AMLODIPINE BESYLATE 5 MG PO TABS: ORAL_TABLET | ORAL | Status: DC

## 2012-01-05 NOTE — Telephone Encounter (Signed)
refill norvasc 5mg  tabl #90 take one tablet by mouth every day ---last fill 2.21.13 Last ov 1.24.13 CPE

## 2012-01-11 IMAGING — CR DG PORTABLE PELVIS
1 series · 1 of 1 positions shown · non-contrast
Comparison: 08/26/2010

CLINICAL DATA: Left hip arthroplasty

PORTABLE PELVIS

[AP]
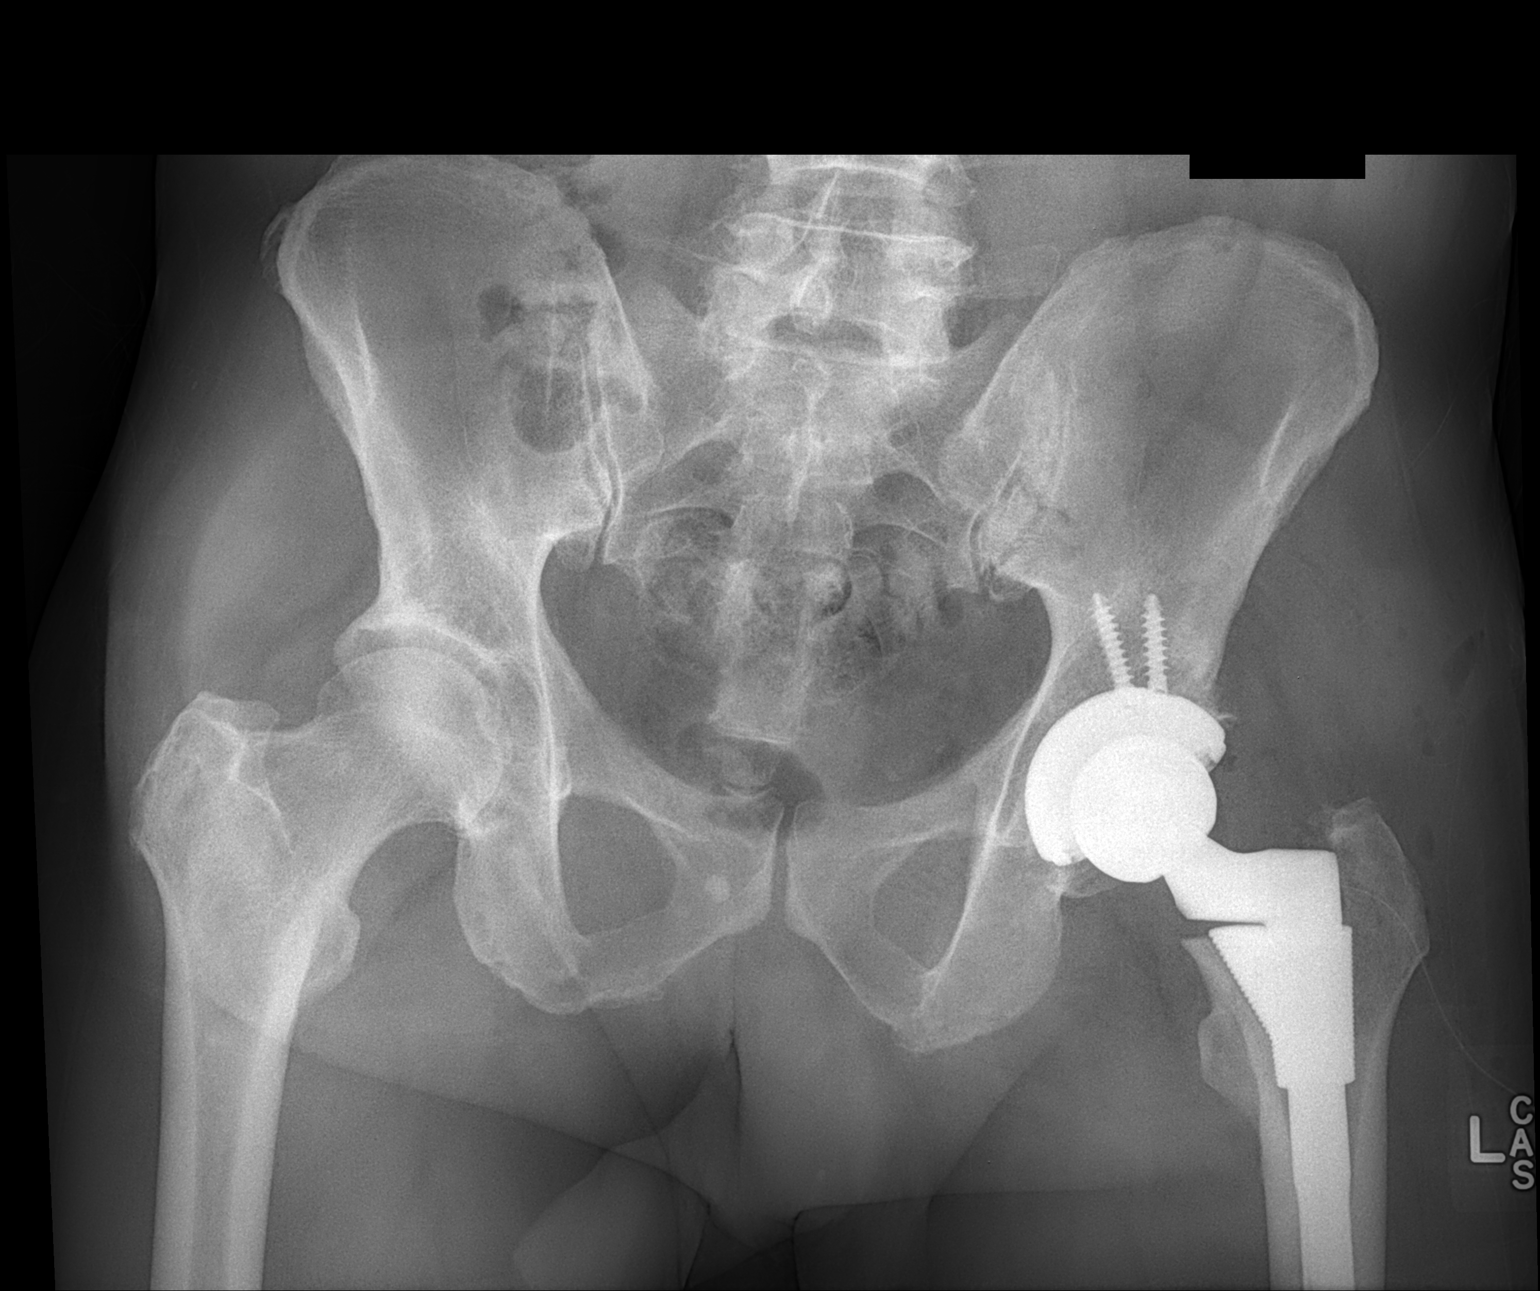

[1 of 1 positions shown; findings below may reference images not displayed]

FINDINGS: Postoperative changes compatible with a left total hip arthroplasty
identified.  The hardware components are in anatomic alignment and
no complicating features identified. There is a surgical drainage
catheter overlying the left hip along with soft tissue emphysema.
IMPRESSION: 1.  Status post left hip arthroplasty.  No complicating features
noted.

## 2012-02-02 DIAGNOSIS — Z23 Encounter for immunization: Secondary | ICD-10-CM | POA: Diagnosis not present

## 2012-05-17 ENCOUNTER — Other Ambulatory Visit: Payer: Self-pay | Admitting: Internal Medicine

## 2012-05-18 ENCOUNTER — Encounter: Payer: Medicare Other | Admitting: Internal Medicine

## 2012-05-20 ENCOUNTER — Other Ambulatory Visit: Payer: Self-pay | Admitting: Internal Medicine

## 2012-06-02 ENCOUNTER — Other Ambulatory Visit: Payer: Self-pay | Admitting: Internal Medicine

## 2012-06-19 ENCOUNTER — Other Ambulatory Visit: Payer: Self-pay | Admitting: Internal Medicine

## 2012-07-05 ENCOUNTER — Telehealth: Payer: Self-pay | Admitting: Internal Medicine

## 2012-07-05 MED ORDER — AMLODIPINE BESYLATE 5 MG PO TABS: ORAL_TABLET | ORAL | Status: DC

## 2012-07-05 NOTE — Telephone Encounter (Signed)
Refill: Norvasc 5 mg tab #90. Take one tablet by mouth every day. Last fill 06-02-12

## 2012-07-05 NOTE — Telephone Encounter (Signed)
RX sent

## 2012-07-28 ENCOUNTER — Encounter: Payer: Self-pay | Admitting: Internal Medicine

## 2012-07-28 ENCOUNTER — Ambulatory Visit (INDEPENDENT_AMBULATORY_CARE_PROVIDER_SITE_OTHER): Payer: Medicare Other | Admitting: Internal Medicine

## 2012-07-28 VITALS — BP 126/80 | HR 50 | Resp 14 | Ht 69.0 in | Wt 194.0 lb

## 2012-07-28 DIAGNOSIS — N402 Nodular prostate without lower urinary tract symptoms: Secondary | ICD-10-CM | POA: Diagnosis not present

## 2012-07-28 DIAGNOSIS — G4733 Obstructive sleep apnea (adult) (pediatric): Secondary | ICD-10-CM | POA: Insufficient documentation

## 2012-07-28 DIAGNOSIS — E785 Hyperlipidemia, unspecified: Secondary | ICD-10-CM

## 2012-07-28 DIAGNOSIS — G4731 Primary central sleep apnea: Secondary | ICD-10-CM | POA: Insufficient documentation

## 2012-07-28 DIAGNOSIS — Z86010 Personal history of colonic polyps: Secondary | ICD-10-CM

## 2012-07-28 DIAGNOSIS — I10 Essential (primary) hypertension: Secondary | ICD-10-CM | POA: Diagnosis not present

## 2012-07-28 DIAGNOSIS — Z0000 Encounter for general adult medical examination without abnormal findings: Secondary | ICD-10-CM

## 2012-07-28 LAB — CBC WITH DIFFERENTIAL/PLATELET
Basophils Absolute: 0 10*3/uL (ref 0.0–0.1)
Basophils Relative: 0.3 % (ref 0.0–3.0)
Eosinophils Absolute: 0.2 10*3/uL (ref 0.0–0.7)
Hemoglobin: 14.9 g/dL (ref 13.0–17.0)
Lymphocytes Relative: 35.1 % (ref 12.0–46.0)
MCHC: 33.4 g/dL (ref 30.0–36.0)
MCV: 95.8 fl (ref 78.0–100.0)
Monocytes Absolute: 0.6 10*3/uL (ref 0.1–1.0)
Neutro Abs: 2.8 10*3/uL (ref 1.4–7.7)
RBC: 4.67 Mil/uL (ref 4.22–5.81)
RDW: 12.9 % (ref 11.5–14.6)

## 2012-07-28 LAB — LIPID PANEL
Cholesterol: 163 mg/dL (ref 0–200)
HDL: 44.7 mg/dL (ref >39.00)
Triglycerides: 100 mg/dL (ref 0.0–149.0)
VLDL: 20 mg/dL (ref 0.0–40.0)

## 2012-07-28 LAB — BASIC METABOLIC PANEL
CO2: 28 mEq/L (ref 19–32)
Calcium: 8.9 mg/dL (ref 8.4–10.5)
Chloride: 102 mEq/L (ref 96–112)
Creatinine, Ser: 1.2 mg/dL (ref 0.4–1.5)
Glucose, Bld: 109 mg/dL — ABNORMAL HIGH (ref 70–99)

## 2012-07-28 LAB — HEPATIC FUNCTION PANEL
Albumin: 4.2 g/dL (ref 3.5–5.2)
Total Protein: 7.2 g/dL (ref 6.0–8.3)

## 2012-07-28 NOTE — Progress Notes (Signed)
Subjective:    Patient ID: Daniel Tucker, male    DOB: 1940/09/11, 73 y.o.   MRN: [id number]  HPI Medicare Wellness Visit:  Psychosocial & medical history were reviewed as required by Medicare (abuse,antisocial behavioral risks,firearm risk).  Social history: caffeine:2 cups / day & 1 tea  , alcohol:  rarely ,  tobacco use:   Quit 1970 Exercise : walking > 2 mpd No home & personal  safety / fall risk Activities of daily living: no limitations  Seatbelt  and smoke alarm employed. Power of Attorney/Living Will status : needed Ophthalmology exam not current Hearing evaluation not current Orientation :oriented X 3  Memory & recall :good Math testing:good Mood & affect : normal . Depression / anxiety: denied Travel history : last 2011 [place] Immunization status : Shingles deferred due to wife's immune compromise Transfusion history:  none  Preventive health surveillance ( colonoscopy as per protocol/ [facility]): current Dental care:  Every 6 mos. Chart reviewed &  Updated. Active issues reviewed & addressed.      Review of Systems He is on a heart healthy diet;  Exercise as noted without symptoms. Specifically he denies chest pain, palpitations, dyspnea, or claudication. Family history is negative for premature coronary disease. Statin compliant w/o myalgias or GI symptoms.BP @ home 125-130/75-85 @ home.     Objective:   Physical Exam  Gen.: Healthy and well-nourished in appearance. Alert, appropriate and cooperative throughout exam.Appears younger than stated age  Head: Normocephalic without obvious abnormalities  Eyes: No corneal or conjunctival inflammation noted. Pupils equal round reactive to light and accommodation.  Extraocular motion intact. Vision grossly normal without lenses Ears: External  ear exam reveals no significant lesions or deformities. Canals clear .TMs normal. Hearing is grossly normal bilaterally. Nose: External nasal exam reveals no deformity or inflammation.  Nasal mucosa are pink and moist. No lesions or exudates noted.   Mouth: Oral mucosa and oropharynx reveal no lesions or exudates. Teeth in good repair. Neck: No deformities, masses, or tenderness noted. Range of motion normal .Thyroid small. Lungs: Normal respiratory effort; chest expands symmetrically. Lungs are clear to auscultation without rales, wheezes, or increased work of breathing. Heart: Normal rate and rhythm. Normal S1 and S2. No gallop, click, or rub. S4 with slurring at  LSB. Abdomen: Bowel sounds normal; abdomen soft and nontender. No masses, organomegaly or hernias noted. 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 Prostate is mildly asymmetrically enlarged with small nodule in L lobe.No induration.                                Musculoskeletal/extremities: There is some asymmetry of the posterior thoracic musculature suggesting occult scoliosis. No clubbing, cyanosis, edema, or significant extremity  deformity noted. Range of motion normal .Tone & strength  Normal. Joints normal . Chronic  onycholytic nail  deformities. Able to lie down & sit up w/o help. Negative SLR bilaterally Vascular: Carotid, radial artery, dorsalis pedis and  posterior tibial pulses are full and equal. No bruits present. Neurologic: Alert and oriented x3. Deep tendon reflexes symmetrical and normal.         Skin: Intact without suspicious lesions or rashes. Lymph: No cervical, axillary, or inguinal lymphadenopathy present. Psych: Mood and affect are normal. Normally interactive  Assessment & Plan:  #1 Medicare Wellness Exam; criteria met ; data entered #2 Problem List reviewed ; Assessment/ Recommendations made Plan: see Orders

## 2012-07-28 NOTE — Patient Instructions (Addendum)
If you activate the  My Chart system; lab & Xray results will be released directly  to you as soon as I review & address these through the computer. If you choose not to sign up for My Chart within 36 hours of labs being drawn; results will be reviewed & interpretation added before being copied & mailed, causing a delay in getting the results to you.If you do not receive that report within 7-10 days ,please call. Additionally you can use this system to gain direct  access to your records  if  out of town or @ an office of a  physician who is not in  the My Chart network.  This improves continuity of care & places you in control of your medical record. Review and correct the record as indicated. Please share record with all medical staff seen. 

## 2012-08-02 ENCOUNTER — Ambulatory Visit: Payer: Medicare Other

## 2012-08-02 DIAGNOSIS — R7309 Other abnormal glucose: Secondary | ICD-10-CM

## 2012-08-03 ENCOUNTER — Encounter: Payer: Self-pay | Admitting: *Deleted

## 2012-08-14 ENCOUNTER — Other Ambulatory Visit: Payer: Self-pay | Admitting: Internal Medicine

## 2012-08-29 ENCOUNTER — Other Ambulatory Visit: Payer: Self-pay | Admitting: Internal Medicine

## 2012-10-02 ENCOUNTER — Other Ambulatory Visit: Payer: Self-pay | Admitting: Internal Medicine

## 2012-10-04 ENCOUNTER — Other Ambulatory Visit: Payer: Self-pay | Admitting: Internal Medicine

## 2013-02-15 ENCOUNTER — Ambulatory Visit (INDEPENDENT_AMBULATORY_CARE_PROVIDER_SITE_OTHER): Payer: Medicare Other | Admitting: General Practice

## 2013-02-15 DIAGNOSIS — Z23 Encounter for immunization: Secondary | ICD-10-CM

## 2013-05-11 ENCOUNTER — Other Ambulatory Visit: Payer: Self-pay | Admitting: Internal Medicine

## 2013-05-11 NOTE — Telephone Encounter (Signed)
Acyclovir refilled per protocol. JG//CMA 

## 2013-06-30 ENCOUNTER — Other Ambulatory Visit: Payer: Self-pay | Admitting: *Deleted

## 2013-06-30 MED ORDER — METOPROLOL TARTRATE 50 MG PO TABS: ORAL_TABLET | ORAL | Status: DC

## 2013-06-30 NOTE — Telephone Encounter (Signed)
Rx sent to the pharmacy by e-script.  Pt needs complete physical and fasting labs.//AB/CMA 

## 2013-07-29 ENCOUNTER — Encounter: Payer: Medicare Other | Admitting: Internal Medicine

## 2013-08-06 ENCOUNTER — Other Ambulatory Visit: Payer: Self-pay | Admitting: Internal Medicine

## 2013-08-09 ENCOUNTER — Other Ambulatory Visit: Payer: Self-pay

## 2013-08-09 MED ORDER — ACYCLOVIR 200 MG PO CAPS: ORAL_CAPSULE | ORAL | Status: DC

## 2013-08-17 ENCOUNTER — Ambulatory Visit (INDEPENDENT_AMBULATORY_CARE_PROVIDER_SITE_OTHER): Payer: Medicare Other | Admitting: Internal Medicine

## 2013-08-17 ENCOUNTER — Telehealth: Payer: Self-pay

## 2013-08-17 ENCOUNTER — Encounter: Payer: Self-pay | Admitting: Internal Medicine

## 2013-08-17 ENCOUNTER — Other Ambulatory Visit (INDEPENDENT_AMBULATORY_CARE_PROVIDER_SITE_OTHER): Payer: Medicare Other

## 2013-08-17 VITALS — BP 140/90 | HR 65 | Temp 97.4°F | Resp 14 | Ht 69.0 in | Wt 197.2 lb

## 2013-08-17 DIAGNOSIS — Z860100 Personal history of colon polyps, unspecified: Secondary | ICD-10-CM

## 2013-08-17 DIAGNOSIS — I10 Essential (primary) hypertension: Secondary | ICD-10-CM | POA: Diagnosis not present

## 2013-08-17 DIAGNOSIS — Z0000 Encounter for general adult medical examination without abnormal findings: Secondary | ICD-10-CM

## 2013-08-17 DIAGNOSIS — E785 Hyperlipidemia, unspecified: Secondary | ICD-10-CM | POA: Diagnosis not present

## 2013-08-17 DIAGNOSIS — N402 Nodular prostate without lower urinary tract symptoms: Secondary | ICD-10-CM

## 2013-08-17 LAB — HEPATIC FUNCTION PANEL
ALK PHOS: 61 U/L (ref 39–117)
ALT: 19 U/L (ref 0–53)
AST: 23 U/L (ref 0–37)
Albumin: 4.1 g/dL (ref 3.5–5.2)
BILIRUBIN DIRECT: 0.1 mg/dL (ref 0.0–0.3)
Total Bilirubin: 0.9 mg/dL (ref 0.3–1.2)
Total Protein: 6.8 g/dL (ref 6.0–8.3)

## 2013-08-17 LAB — CBC WITH DIFFERENTIAL/PLATELET
Basophils Absolute: 0 10*3/uL (ref 0.0–0.1)
Basophils Relative: 0.3 % (ref 0.0–3.0)
Eosinophils Absolute: 0.2 10*3/uL (ref 0.0–0.7)
Eosinophils Relative: 3.8 % (ref 0.0–5.0)
HEMATOCRIT: 43.3 % (ref 39.0–52.0)
Hemoglobin: 14.8 g/dL (ref 13.0–17.0)
LYMPHS ABS: 2.1 10*3/uL (ref 0.7–4.0)
Lymphocytes Relative: 37.7 % (ref 12.0–46.0)
MCHC: 34.2 g/dL (ref 30.0–36.0)
MCV: 96.1 fl (ref 78.0–100.0)
Monocytes Absolute: 0.7 10*3/uL (ref 0.1–1.0)
Monocytes Relative: 12.5 % — ABNORMAL HIGH (ref 3.0–12.0)
NEUTROS PCT: 45.7 % (ref 43.0–77.0)
Neutro Abs: 2.5 10*3/uL (ref 1.4–7.7)
Platelets: 209 10*3/uL (ref 150.0–400.0)
RBC: 4.5 Mil/uL (ref 4.22–5.81)
RDW: 12.9 % (ref 11.5–14.6)
WBC: 5.5 10*3/uL (ref 4.5–10.5)

## 2013-08-17 LAB — LIPID PANEL
CHOL/HDL RATIO: 3
Cholesterol: 155 mg/dL (ref 0–200)
HDL: 45.8 mg/dL (ref >39.00)
LDL CALC: 91 mg/dL (ref 0–99)
Triglycerides: 91 mg/dL (ref 0.0–149.0)
VLDL: 18.2 mg/dL (ref 0.0–40.0)

## 2013-08-17 LAB — BASIC METABOLIC PANEL
BUN: 21 mg/dL (ref 6–23)
CO2: 31 meq/L (ref 19–32)
Calcium: 9.4 mg/dL (ref 8.4–10.5)
Chloride: 102 mEq/L (ref 96–112)
Creatinine, Ser: 1.1 mg/dL (ref 0.4–1.5)
GFR: 67.08 mL/min (ref >60.00)
Glucose, Bld: 112 mg/dL — ABNORMAL HIGH (ref 70–99)
Potassium: 4.1 mEq/L (ref 3.5–5.1)
Sodium: 138 mEq/L (ref 135–145)

## 2013-08-17 LAB — PSA: PSA: 0.47 ng/mL (ref 0.10–4.00)

## 2013-08-17 LAB — TSH: TSH: 1.18 u[IU]/mL (ref 0.35–5.50)

## 2013-08-17 MED ORDER — AMLODIPINE BESYLATE 5 MG PO TABS: ORAL_TABLET | ORAL | Status: DC

## 2013-08-17 MED ORDER — ACYCLOVIR 200 MG PO CAPS: ORAL_CAPSULE | ORAL | Status: DC

## 2013-08-17 MED ORDER — METOPROLOL TARTRATE 50 MG PO TABS: ORAL_TABLET | ORAL | Status: DC

## 2013-08-17 MED ORDER — PRAVASTATIN SODIUM 20 MG PO TABS: ORAL_TABLET | ORAL | Status: DC

## 2013-08-17 NOTE — Assessment & Plan Note (Addendum)
Abnormal DRE ; no Urology F/U Check PSA

## 2013-08-17 NOTE — Progress Notes (Signed)
Subjective:    Patient ID: Daniel Tucker, male    DOB: 1941-01-26, 73 y.o.   MRN: [id number]  HPI Medicare Wellness Visit [facility]): Psychosocial and medical history were reviewed as required by Medicare (history related to abuse, antisocial behavior , firearm risk). Social history: Caffeine:2 cups coffee/day  , Alcohol: 1 /week, Tobacco use: quit in 20s Exercise:see below Personal safety/fall risk:no Limitations of activities of daily living:no Seatbelt/ smoke alarm use:yes Healthcare Power of Attorney/Living Will status: UTD Ophthalmologic exam status:UTD Hearing evaluation status:not UTD Orientation: Oriented X #3 Memory and recall: good Math testing: good Depression/anxiety assessment: no Foreign travel history: 2013 [place] Immunization status for influenza/pneumonia/ shingles Margaretmary Eddy: ? Prevnar needed Transfusion history:no Preventive health care maintenance status: Colonoscopy as per protocol/standard care: UTD Dental care:every 6-12 mos Chart reviewed and updated. Active issues reviewed and addressed as documented below.  Dyslipidemia : A modified  heart healthy diet is followed; exercise encompasses 45 minutes 7  times per week as  walking without symptoms.  There is medication compliance with the statin.  Low dose ASA taken  HTN: Blood pressure range / average : 130-135/80-85 Compliant with anti hypertemsive medication. No lightheadedness or other adverse medication effect described.     Review of Systems Significant abdominal symptoms, memory deficit, or myalgias not present.  Significant headaches, epistaxis, chest pain, palpitations, exertional dyspnea, claudication, paroxysmal nocturnal dyspnea, or edema absent.   No unexplained weight loss, abdominal pain, significant dyspepsia, dysphagia, melena, rectal bleeding, or persistently small caliber stools.        Objective:   Physical Exam Gen.: Healthy and well-nourished in appearance. Alert,  appropriate and cooperative throughout exam. Appears younger than stated age  Head: Normocephalic without obvious abnormalities;  pattern alopecia  Eyes: No corneal or conjunctival inflammation noted. Pupils equal round reactive to light and accommodation. Extraocular motion intact.  Ears: External  ear exam reveals no significant lesions or deformities. Canals clear .TMs normal. Hearing is grossly normal bilaterally. Nose: External nasal exam reveals no deformity or inflammation. Nasal mucosa are pink and moist. No lesions or exudates noted.   Mouth: Oral mucosa and oropharynx reveal no lesions or exudates. Teeth in good repair. Neck: No deformities, masses, or tenderness noted. Range of motion &Thyroid normal Lungs: Normal respiratory effort; chest expands symmetrically. Lungs are clear to auscultation without rales, wheezes, or increased work of breathing. Heart: Normal rate and rhythm. Split  S1 and normal  S2. No gallop, click, or rub. No murmur. Abdomen: Bowel sounds normal; abdomen soft and nontender. No masses, organomegaly or hernias noted. Genitalia: Genitalia normal except for left varices. Prostate is  Asymmetric, L lobe 1.5 +, R normal.Midline small polyp/cyst.                                  Musculoskeletal/extremities: No deformity or scoliosis noted of  the thoracic or lumbar spine.    No clubbing, cyanosis, edema, or significant extremity  deformity noted. Range of motion normal .Tone & strength normal. Hand joints normal Fingernail  health good. Thickened toenails Able to lie down & sit up w/o help. Negative SLR bilaterally Vascular: Carotid, radial artery, dorsalis pedis and  posterior tibial pulses are full and equal. No bruits present. Neurologic: Alert and oriented x3. Deep tendon reflexes symmetrical and normal.  Gait normal      Skin: Intact without suspicious lesions or rashes. Lymph: No cervical, axillary, or inguinal lymphadenopathy present. Psych:  Mood and affect  are normal. Normally interactive                                                                                        Assessment & Plan:  #1 Medicare Wellness Exam; criteria met ; data entered #2 Problem List/Diagnoses reviewed Plan:  Assessments made/ Orders entered. The labs will be reviewed and risks and options assessed. Written recommendations will be provided by mail or directly through My Chart.Further evaluation or change in medical therapy will be directed by those results.

## 2013-08-17 NOTE — Patient Instructions (Signed)

## 2013-08-17 NOTE — Assessment & Plan Note (Signed)
Blood pressure goals reviewed. BMET 

## 2013-08-17 NOTE — Assessment & Plan Note (Addendum)
Colonoscopy current; will check CBC & dif

## 2013-08-17 NOTE — Telephone Encounter (Signed)
Message copied by Shelly Coss on Wed Aug 17, 2013  4:48 PM ------      Message from: Hendricks Limes      Created: Wed Aug 17, 2013  4:44 PM       Please add A1c (790.29)       ------

## 2013-08-17 NOTE — Assessment & Plan Note (Signed)
Lipids, LFTs, TSH  

## 2013-08-17 NOTE — Progress Notes (Signed)
Pre visit review using our clinic review tool, if applicable. No additional management support is needed unless otherwise documented below in the visit note. 

## 2013-08-17 NOTE — Telephone Encounter (Signed)
Add on lab request has been faxed

## 2013-08-18 ENCOUNTER — Ambulatory Visit: Payer: Medicare Other

## 2013-08-18 DIAGNOSIS — R7309 Other abnormal glucose: Secondary | ICD-10-CM

## 2013-08-18 LAB — HEMOGLOBIN A1C: HEMOGLOBIN A1C: 5.7 % (ref 4.6–6.5)

## 2013-09-09 ENCOUNTER — Other Ambulatory Visit: Payer: Self-pay

## 2013-09-09 MED ORDER — METOPROLOL TARTRATE 50 MG PO TABS: ORAL_TABLET | ORAL | Status: DC

## 2013-09-09 MED ORDER — AMLODIPINE BESYLATE 5 MG PO TABS: ORAL_TABLET | ORAL | Status: DC

## 2013-09-09 MED ORDER — ACYCLOVIR 200 MG PO CAPS: ORAL_CAPSULE | ORAL | Status: DC

## 2013-09-09 MED ORDER — PRAVASTATIN SODIUM 20 MG PO TABS: ORAL_TABLET | ORAL | Status: DC

## 2014-01-26 DIAGNOSIS — Z23 Encounter for immunization: Secondary | ICD-10-CM | POA: Diagnosis not present

## 2014-02-14 ENCOUNTER — Telehealth: Payer: Self-pay | Admitting: *Deleted

## 2014-02-14 MED ORDER — METOPROLOL TARTRATE 50 MG PO TABS: ORAL_TABLET | ORAL | Status: DC

## 2014-02-14 NOTE — Telephone Encounter (Signed)
Left msg on triage needing new rx sent to [facility] on his metoprolol. Called pt back spoke with wife inform her rx has been sent to [facility]...Johny Chess

## 2014-03-20 DIAGNOSIS — H43812 Vitreous degeneration, left eye: Secondary | ICD-10-CM | POA: Diagnosis not present

## 2014-05-15 ENCOUNTER — Telehealth: Payer: Self-pay | Admitting: Internal Medicine

## 2014-05-15 MED ORDER — METOPROLOL TARTRATE 50 MG PO TABS: ORAL_TABLET | ORAL | Status: DC

## 2014-05-15 NOTE — Telephone Encounter (Signed)
Pt called in to get refill on his metoprolol (LOPRESSOR) 50 MG tablet [[id number]].  Needs to be sent to : [facility] mail order  Setup pt cpe for 08/24/2014.  Last seen 08/17/2013

## 2014-05-15 NOTE — Telephone Encounter (Signed)
Notified pt rx sent to [facility]...Johny Chess

## 2014-05-22 ENCOUNTER — Encounter: Payer: Self-pay | Admitting: Gastroenterology

## 2014-06-01 DIAGNOSIS — Z471 Aftercare following joint replacement surgery: Secondary | ICD-10-CM | POA: Diagnosis not present

## 2014-06-01 DIAGNOSIS — M1611 Unilateral primary osteoarthritis, right hip: Secondary | ICD-10-CM | POA: Diagnosis not present

## 2014-06-01 DIAGNOSIS — Z96642 Presence of left artificial hip joint: Secondary | ICD-10-CM | POA: Diagnosis not present

## 2014-06-16 ENCOUNTER — Other Ambulatory Visit: Payer: Self-pay | Admitting: Internal Medicine

## 2014-06-19 ENCOUNTER — Encounter: Payer: Self-pay | Admitting: Gastroenterology

## 2014-07-19 ENCOUNTER — Other Ambulatory Visit: Payer: Self-pay | Admitting: Internal Medicine

## 2014-08-24 ENCOUNTER — Encounter: Payer: Self-pay | Admitting: Internal Medicine

## 2014-08-24 ENCOUNTER — Other Ambulatory Visit (INDEPENDENT_AMBULATORY_CARE_PROVIDER_SITE_OTHER): Payer: Medicare Other

## 2014-08-24 ENCOUNTER — Telehealth: Payer: Self-pay | Admitting: *Deleted

## 2014-08-24 ENCOUNTER — Ambulatory Visit (INDEPENDENT_AMBULATORY_CARE_PROVIDER_SITE_OTHER): Payer: Medicare Other | Admitting: Internal Medicine

## 2014-08-24 ENCOUNTER — Telehealth: Payer: Self-pay | Admitting: Internal Medicine

## 2014-08-24 ENCOUNTER — Other Ambulatory Visit: Payer: Self-pay | Admitting: Internal Medicine

## 2014-08-24 VITALS — BP 124/88 | HR 82 | Temp 97.6°F | Resp 15 | Ht 69.0 in | Wt 196.0 lb

## 2014-08-24 DIAGNOSIS — Z0000 Encounter for general adult medical examination without abnormal findings: Secondary | ICD-10-CM

## 2014-08-24 DIAGNOSIS — N402 Nodular prostate without lower urinary tract symptoms: Secondary | ICD-10-CM

## 2014-08-24 DIAGNOSIS — E785 Hyperlipidemia, unspecified: Secondary | ICD-10-CM | POA: Diagnosis not present

## 2014-08-24 DIAGNOSIS — I10 Essential (primary) hypertension: Secondary | ICD-10-CM

## 2014-08-24 DIAGNOSIS — I499 Cardiac arrhythmia, unspecified: Secondary | ICD-10-CM

## 2014-08-24 DIAGNOSIS — I4819 Other persistent atrial fibrillation: Secondary | ICD-10-CM

## 2014-08-24 DIAGNOSIS — I4891 Unspecified atrial fibrillation: Secondary | ICD-10-CM | POA: Diagnosis not present

## 2014-08-24 DIAGNOSIS — Z860100 Personal history of colon polyps, unspecified: Secondary | ICD-10-CM

## 2014-08-24 DIAGNOSIS — Z23 Encounter for immunization: Secondary | ICD-10-CM | POA: Diagnosis not present

## 2014-08-24 HISTORY — DX: Other persistent atrial fibrillation: I48.19

## 2014-08-24 LAB — CBC WITH DIFFERENTIAL/PLATELET
BASOS ABS: 0 10*3/uL (ref 0.0–0.1)
Basophils Relative: 0.3 % (ref 0.0–3.0)
EOS ABS: 0.2 10*3/uL (ref 0.0–0.7)
Eosinophils Relative: 1.9 % (ref 0.0–5.0)
HCT: 48.7 % (ref 39.0–52.0)
HEMOGLOBIN: 16.6 g/dL (ref 13.0–17.0)
Lymphocytes Relative: 19.8 % (ref 12.0–46.0)
Lymphs Abs: 1.6 10*3/uL (ref 0.7–4.0)
MCHC: 34.1 g/dL (ref 30.0–36.0)
MCV: 94 fl (ref 78.0–100.0)
MONOS PCT: 8.4 % (ref 3.0–12.0)
Monocytes Absolute: 0.7 10*3/uL (ref 0.1–1.0)
NEUTROS ABS: 5.7 10*3/uL (ref 1.4–7.7)
NEUTROS PCT: 69.6 % (ref 43.0–77.0)
PLATELETS: 236 10*3/uL (ref 150.0–400.0)
RBC: 5.18 Mil/uL (ref 4.22–5.81)
RDW: 13.2 % (ref 11.5–15.5)
WBC: 8.2 10*3/uL (ref 4.0–10.5)

## 2014-08-24 LAB — BASIC METABOLIC PANEL
BUN: 21 mg/dL (ref 6–23)
CALCIUM: 9.7 mg/dL (ref 8.4–10.5)
CO2: 30 meq/L (ref 19–32)
CREATININE: 1.14 mg/dL (ref 0.40–1.50)
Chloride: 101 mEq/L (ref 96–112)
GFR: 66.89 mL/min (ref >60.00)
Glucose, Bld: 124 mg/dL — ABNORMAL HIGH (ref 70–99)
Potassium: 4.1 mEq/L (ref 3.5–5.1)
Sodium: 138 mEq/L (ref 135–145)

## 2014-08-24 LAB — PSA: PSA: 0.42 ng/mL (ref 0.10–4.00)

## 2014-08-24 LAB — HEPATIC FUNCTION PANEL
ALBUMIN: 4.4 g/dL (ref 3.5–5.2)
ALK PHOS: 68 U/L (ref 39–117)
ALT: 16 U/L (ref 0–53)
AST: 18 U/L (ref 0–37)
Bilirubin, Direct: 0.2 mg/dL (ref 0.0–0.3)
Total Bilirubin: 1 mg/dL (ref 0.2–1.2)
Total Protein: 7.6 g/dL (ref 6.0–8.3)

## 2014-08-24 LAB — TSH: TSH: 1.21 u[IU]/mL (ref 0.35–4.50)

## 2014-08-24 LAB — T3, FREE: T3, Free: 3.3 pg/mL (ref 2.3–4.2)

## 2014-08-24 LAB — T4, FREE: FREE T4: 0.83 ng/dL (ref 0.60–1.60)

## 2014-08-24 NOTE — Patient Instructions (Addendum)
Minimal Blood Pressure Goal= AVERAGE < 140/90;  Ideal is an AVERAGE < 135/85. This AVERAGE should be calculated from @ least 5-7 BP readings taken @ different times of day on different days of week. You should not respond to isolated BP readings , but rather the AVERAGE for that week .Please bring your  blood pressure cuff to office visits to verify that it is reliable.It  can also be checked against the blood pressure device at the pharmacy. Finger or wrist cuffs are not dependable; an arm cuff is. To prevent palpitations or premature beats, avoid stimulants such as decongestants, diet pills, nicotine, or caffeine (coffee, tea, cola, or chocolate) to excess. The new atrial fibrillation will need to be evaluated by Cardiology before colonoscopy. Take 3 baby ASA daily until told to stop by Cardiology or GI.  Your next office appointment will be determined based upon review of your pending labs  and  xraysThose instructions will be transmitted to you by My Chart Critical results will be called. Followup as needed for any active or acute issue. Please report any significant change in your symptoms.  Preventive Health Care: Exercise at least 30-45 minutes a day,  3-4 days a week.  Eat a low-fat diet with lots of fruits and vegetables, up to 7-9 servings per day. This would eliminate the need for vitamin supplements. Avoid obesity; your goal is waist measurement < 40 inches.Consume less than 40 grams of sugar (preferably ZERO) per day from foods & drinks with High Fructose Corn Sugar as #1,2,3 or # 4 on label. Seatbelts can save your life. Wear them always. Smoke detectors on every level of your home, check batteries every year. Eye Doctor - have an eye exam @ least annually.   Health Care Power of [place]. Complete these if not in place ; these place you in charge of your health care decisions. Depression is common in our stressful world.If you are having symptoms or losing interest in  things you normally enjoy, please schedule an appointment to address this .

## 2014-08-24 NOTE — Assessment & Plan Note (Signed)
PSA as per Dr Simone Curia request

## 2014-08-24 NOTE — Progress Notes (Signed)
Pre visit review using our clinic review tool, if applicable. No additional management support is needed unless otherwise documented below in the visit note. 

## 2014-08-24 NOTE — Progress Notes (Signed)
Subjective:    Patient ID: Daniel Tucker, male    DOB: [birth year], 74 y.o.   MRN: [id number]  HPI Medicare Wellness Visit: Psychosocial and medical history were reviewed as required by Medicare (history related to abuse, antisocial behavior , firearm risk). Social history: Caffeine: 1 cup coffee & 1 cola or tea Alcohol:  rarely Tobacco LMB:EMLJ 1970 Exercise:walking 1.5-2 mpd w/o cardiopulmonary symptoms Personal safety/fall risk:no Limitations of activities of daily living:no Seatbelt/ smoke alarm use:yes Healthcare Power of Attorney/Living Will status and End of Life process assessment : not UTD ( discussed) Ophthalmologic exam status:UTD Hearing evaluation status:not UTD Orientation: Oriented X 3 Memory and recall: good Math testing: excellent Depression/anxiety assessment: no Foreign travel history: 2013 [place] Immunization status for influenza/pneumonia/ shingles /tetanus: Shingles contraindicated due to wife's issues;needs Prevnar 13 Transfusion history:no Preventive health care maintenance status: Colonoscopy as per protocol/standard care:colonoscopy in 2 weeks Dental care:every  12 mos Chart reviewed and updated. Active issues reviewed and addressed as documented below.  He has been compliant with his medicines without adverse effects. He is on a modified heart healthy, low-salt diet. Exercise described above.  He has noted some increase in his blood pressure and pulse in the last 2 weeks as well as some slight irregularity without specific trigger.  Colonoscopy as noted is scheduled in 2 weeks. He has no active GI symptoms.  He had a prostate biopsy in 2010 which was negative. His PSAs have been less than 0.5. His urologist requests annual PSA. His father and one brother had prostate cancer. Another brother has had a nodule which was biopsied; the cytology was negative for malignancy.  The only active GU symptom is nocturia 1-2 times per night.  Review of Systems    Significant headaches, epistaxis, chest pain, exertional dyspnea, claudication, paroxysmal nocturnal dyspnea, or edema absent. No GI symptoms , memory loss or myalgias  Unexplained weight loss, abdominal pain, significant dyspepsia, dysphagia, melena, rectal bleeding, or persistently small caliber stools are denied.  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 No surgery is planned at this time. 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  He denies numbness, tingling, weakness in the extremities. He has no loss of control of bladder or bowels..  He denies dysuria, pyuria, or hematuria. He has no hesitancy or other symptoms of obstruction.    Objective:   Physical Exam Gen.: Adequately nourished in appearance. Alert, appropriate and cooperative throughout exam. BMI: Appears younger than stated age  Head: Normocephalic without obvious abnormalities; pattern alopecia  Eyes: Slight bilateral ptosis.No corneal or conjunctival inflammation noted. Pupils equal round reactive to light and accommodation. Extraocular motion intact.  Ears: External  ear exam reveals no significant lesions or deformities. Canals clear .TMs normal. Hearing is grossly normal bilaterally. Nose: External nasal exam reveals no deformity or inflammation. Nasal mucosa are pink and moist. No lesions or exudates noted.   Mouth: Oral mucosa and oropharynx reveal no lesions or exudates. Teeth in good repair. Neck: No deformities, masses, or tenderness noted. Range of motion essentially normal. Thyroid normal. Lungs: Normal respiratory effort; chest expands symmetrically. Lungs are clear to auscultation without rales, wheezes, or increased work of breathing. Heart: Subtle irregularity; intermittent premature beats suggested rather than AF. Normal S1 and S2. No gallop, click, or rub. No murmur. Abdomen: Bowel sounds normal; abdomen soft and nontender. No masses, organomegaly or  hernias noted. Genitalia: as per Dr Karsten Ro  Musculoskeletal/extremities: No deformity or scoliosis noted of  the thoracic or lumbar spine.  No clubbing, cyanosis, edema, or significant extremity  deformity noted.  Range of motion normal . Tone & strength normal. Hand joints normal Fingernails:isolated onycholysis RUE. Crepitus of knees  Able to lie down & sit up w/o help.  Negative SLR bilaterally Vascular: Carotid, radial artery, dorsalis pedis and  posterior tibial pulses are full and equal. No bruits present. Neurologic: Alert and oriented x3. Deep tendon reflexes symmetrical and normal.  Gait normal       Skin: Intact without suspicious lesions or rashes. Lymph: No cervical, axillary lymphadenopathy present. Psych: Mood and affect are normal. Normally interactive                                                                                       Assessment & Plan:  See Current Assessment & Plan in Problem List under specific DiagnosisThe labs will be reviewed and risks and options assessed. Written recommendations will be provided by mail or directly through My Chart.Further evaluation or change in medical therapy will be directed by those results. New onset AF on EKG;Cardiology assessment of risk & therapy needed prior to colonoscopy or any other elective surgery

## 2014-08-24 NOTE — Telephone Encounter (Signed)
Patient would like to be referred to [facility].

## 2014-08-24 NOTE — Assessment & Plan Note (Signed)
NMR Lipoprofile, hepatic panel,TSH

## 2014-08-24 NOTE — Assessment & Plan Note (Signed)
Blood pressure goals reviewed. BMET 

## 2014-08-24 NOTE — Telephone Encounter (Signed)
Pt in [place] after PCP appointment with a newly diagnosed A-Fib .  Labs were drawn and cardio referral to be placed per Dr Linna Darner. Spoke with Dr Fuller Plan and per Dr Fuller Plan colon needs to be done after pt has received cardiac clearance . Pt and wife in [place] and were given instructions to call and reschedule PV and colon after the cardiologist clears pt. Both verbalized understanding and states will call after.  Colon for 5-19 Thursday cancelled.  Lelan Pons PV

## 2014-08-24 NOTE — Assessment & Plan Note (Signed)
TSH, free T 3 & free T4 Cardiology referral

## 2014-08-24 NOTE — Assessment & Plan Note (Signed)
Colonoscopy in 2 weeks CBC

## 2014-08-25 ENCOUNTER — Other Ambulatory Visit: Payer: Self-pay | Admitting: Internal Medicine

## 2014-08-25 ENCOUNTER — Other Ambulatory Visit (INDEPENDENT_AMBULATORY_CARE_PROVIDER_SITE_OTHER): Payer: Medicare Other

## 2014-08-25 ENCOUNTER — Telehealth: Payer: Self-pay

## 2014-08-25 ENCOUNTER — Telehealth: Payer: Self-pay | Admitting: Internal Medicine

## 2014-08-25 DIAGNOSIS — R739 Hyperglycemia, unspecified: Secondary | ICD-10-CM | POA: Diagnosis not present

## 2014-08-25 DIAGNOSIS — I4892 Unspecified atrial flutter: Secondary | ICD-10-CM

## 2014-08-25 LAB — HEMOGLOBIN A1C: Hgb A1c MFr Bld: 6.2 % (ref 4.6–6.5)

## 2014-08-25 NOTE — Telephone Encounter (Signed)
Pt's wife has been advised.

## 2014-08-25 NOTE — Telephone Encounter (Signed)
Wife would like a call back in regards to husbands visit yesterday.  She states they are going out of town and would like to know if patient would be okay to drive as well what limitations he needs to make to his physical activity such sex and mowing the yard.

## 2014-08-25 NOTE — Telephone Encounter (Signed)
Avoid heat; no mowing.He can drive Dr Mare Ferrari stting up appointment

## 2014-08-25 NOTE — Telephone Encounter (Signed)
-----   Message from Hendricks Limes, MD sent at 08/25/2014  7:00 AM EDT ----- Please add A1c (R73.9)

## 2014-08-25 NOTE — Telephone Encounter (Signed)
Request has been sent to lab

## 2014-08-26 LAB — NMR LIPOPROFILE WITH LIPIDS
Cholesterol, Total: 162 mg/dL (ref 100–199)
HDL Particle Number: 34.9 umol/L (ref >=30.5)
HDL Size: 8.8 nm — ABNORMAL LOW (ref >=9.2)
HDL-C: 50 mg/dL (ref >39)
LARGE HDL: 4.8 umol/L (ref >=4.8)
LDL (calc): 97 mg/dL (ref 0–99)
LDL Particle Number: 1377 nmol/L — ABNORMAL HIGH (ref <1000)
LDL SIZE: 21 nm (ref >=20.8)
LP-IR Score: 54 — ABNORMAL HIGH (ref <=45)
Large VLDL-P: 1.9 nmol/L (ref <=2.7)
SMALL LDL PARTICLE NUMBER: 581 nmol/L — AB (ref <=527)
Triglycerides: 77 mg/dL (ref 0–149)
VLDL Size: 47.5 nm — ABNORMAL HIGH (ref <=46.6)

## 2014-09-07 ENCOUNTER — Encounter: Payer: Medicare Other | Admitting: Gastroenterology

## 2014-09-08 ENCOUNTER — Ambulatory Visit (INDEPENDENT_AMBULATORY_CARE_PROVIDER_SITE_OTHER): Payer: Medicare Other | Admitting: Cardiology

## 2014-09-08 ENCOUNTER — Encounter: Payer: Self-pay | Admitting: Cardiology

## 2014-09-08 VITALS — BP 124/92 | HR 92 | Ht 69.0 in | Wt 198.0 lb

## 2014-09-08 DIAGNOSIS — E7800 Pure hypercholesterolemia, unspecified: Secondary | ICD-10-CM

## 2014-09-08 DIAGNOSIS — I481 Persistent atrial fibrillation: Secondary | ICD-10-CM

## 2014-09-08 DIAGNOSIS — I4819 Other persistent atrial fibrillation: Secondary | ICD-10-CM

## 2014-09-08 DIAGNOSIS — I10 Essential (primary) hypertension: Secondary | ICD-10-CM | POA: Diagnosis not present

## 2014-09-08 MED ORDER — APIXABAN 5 MG PO TABS: 5.0000 mg | ORAL_TABLET | Freq: Two times a day (BID) | ORAL | Status: DC

## 2014-09-08 NOTE — Progress Notes (Signed)
Cardiology Office Note   Date:  09/08/2014   ID:  Daniel Tucker October 17, 1940, MRN [id number]  PCP:  Unice Cobble, MD  Cardiologist: Darlin Coco MD  No chief complaint on file.     History of Present Illness: Daniel Tucker is a 74 y.o. male who presents for evaluation of recent onset atrial fibrillation.  This gentleman is a medical patient of Dr. Unice Cobble.  The patient had a recent annual physical examination and at that time was noted to have an irregular pulse.  EKG done on that date, 08/24/14, showed atrial fibrillation with controlled ventricular response.  The duration of his atrial fibrillation is not known.  The patient thinks that for several months prior to his physical exam that his pulse was running faster.  On the night before his physical examination he asked his wife was a nurse to check his pulse and she noted that he was irregular and she suspected atrial fibrillation.  The patient has had some mild shortness of breath climbing hills which has been worse in the past several months.  He has not been expressing any chest pain or angina.  His last chest x-ray was in 2012 and was normal at that time.  Patient has a history of a known asymptomatic right bundle branch block.  He has a history of hypertension and elevated blood sugar.  He has not had any TIA symptoms or dizzy spells.  Currently is on aspirin.    Past Medical History  Diagnosis Date  . Tubular adenoma of colon 2006    Dr Fuller Plan  . Sleep apnea     S/P oral surgery   . Unspecified essential hypertension   . Fasting hyperglycemia 2006    FBS 111; normal A1c  . Hyperlipidemia   . Atrial fibrillation 08/24/2014    Past Surgical History  Procedure Laterality Date  . Uvulopalatopharyngoplasty  1997    Dr.Woliki  . Nasal sinus surgery    . Tonsillectomy  1997  . Colonoscopy w/ polypectomy  2006    [facility]  . Colonoscopy  2011    no polyp  . Prostate biopsy  2010    Negative,Dr.Ottelin    . 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 acyclovir (ZOVIRAX) 200 MG capsule TAKE TWO CAPSULES BY MOUTH ONCE DAILY AS DIRECTED 180 capsule 3  . amLODipine (NORVASC) 5 MG tablet Take 5 mg by mouth daily.    . Ascorbic Acid (VITAMIN C) 1000 MG tablet Take 1,000 mg by mouth daily.      Marland Kitchen GLUCOSAMINE SULFATE PO Take 2 tablets by mouth daily. On three months/ off three months    . metoprolol (LOPRESSOR) 50 MG tablet Take 25 mg by mouth 2 (two) times daily.    . Multiple Vitamin (MULTIVITAMIN) tablet Take 1 tablet by mouth daily.     . pravastatin (PRAVACHOL) 20 MG tablet TAKE ONE TABLET BY MOUTH ONCE DAILY AT BEDTIME 90 tablet 3  . apixaban (ELIQUIS) 5 MG TABS tablet Take 1 tablet (5 mg total) by mouth 2 (two) times daily. 60 tablet 5   No current facility-administered medications for this visit.    Allergies:   Review of patient's allergies indicates no known allergies.    Social History:  The patient  reports that he quit smoking about 46 years ago. He does not have any smokeless tobacco history  on file. He reports that he drinks alcohol. He reports that he does not use illicit drugs.   Family History:  The patient's family history includes Atrial fibrillation in his brother; Hypertension in his father and mother; Lupus in his sister; Melanoma in his brother and sister; Prostate cancer in his brother and father. There is no history of Diabetes, Heart disease, or Stroke.    ROS:  Please see the history of present illness.   Otherwise, review of systems are positive for none.   All other systems are reviewed and negative.    PHYSICAL EXAM: VS:  BP 124/92 mmHg  Pulse 92  Ht 5\' 9"  (1.753 m)  Wt 198 lb (89.812 kg)  BMI 29.23 kg/m2 , BMI Body mass index is 29.23 kg/(m^2). GEN: Well nourished, well developed, in no acute distress HEENT: normal Neck: no JVD, carotid bruits, or masses Cardiac: Irregularly  irregular; no murmurs, rubs, or gallops,no edema  Respiratory:  clear to auscultation bilaterally, normal work of breathing GI: soft, nontender, nondistended, + BS MS: no deformity or atrophy Skin: warm and dry, no rash Neuro:  Strength and sensation are intact Psych: euthymic mood, full affect   EKG:  EKG is ordered today. The ekg ordered today demonstrates atrial fibrillation with controlled ventricular response.  Ventricular rate is 92 bpm.  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   Recent Labs: 08/24/2014: ALT 16; BUN 21; Creatinine 1.14; Hemoglobin 16.6; Platelets 236.0; Potassium 4.1; Sodium 138; TSH 1.21    Lipid Panel    Component Value Date/Time   CHOL 162 08/24/2014 1042   CHOL 155 08/17/2013 1126   TRIG 77 08/24/2014 1042   TRIG 91.0 08/17/2013 1126   HDL 50 08/24/2014 1042   HDL 45.80 08/17/2013 1126   CHOLHDL 3 08/17/2013 1126   VLDL 18.2 08/17/2013 1126   LDLCALC 97 08/24/2014 1042   LDLCALC 91 08/17/2013 1126      Wt Readings from Last 3 Encounters:  09/08/14 198 lb (89.812 kg)  08/24/14 196 lb (88.905 kg)  08/17/13 197 lb 3.2 oz (89.449 kg)      Other studies Reviewed: Additional studies/ records that were reviewed today include: Review of recent records from his physical examination with Dr. Linna Darner. Review of the above records demonstrates: Atrial fibrillation documented at the time of his physical examination.   ASSESSMENT AND PLAN:  1.  Atrial fibrillation, probably present for several months by history.  He is chads-vasc score of 3 for age, hypertension, and diabetes.  He will benefit from full anticoagulation. 2.  Essential hypertension 3.  Bifascicular block 4.  Elevated blood sugar   Current medicines are reviewed at length with the patient today.  The patient does not have concerns regarding medicines.  The following changes have been made:  We will start Eliquis 5 mg twice a day.  We will  stop his aspirin.  He will continue his Lopressor 25 mg twice a day and his amlodipine.  Labs/ tests ordered today include:  Orders Placed This Encounter  Procedures  . EKG 12-Lead  . ECHOCARDIOGRAM COMPLETE     Disposition:   We will arrange for a two-dimensional echocardiogram.  We will plan to see him back in 3-4 weeks after he has been suitably anticoagulated and then we will consider and discuss possible outpatient cardioversion.. Of note is the fact that the patient has had his thyroid function checked and is satisfactory.  Many thanks for the  opportunity to see this pleasant gentleman in consultation.  We will be in touch regarding the results of his echocardiogram.  Signed, Darlin Coco MD 09/08/2014 5:53 PM    [address], [place], [place]  [address] Phone: [phone]; Fax: [phone]

## 2014-09-08 NOTE — Patient Instructions (Signed)
Medication Instructions:  STOP ASPIRIN   START ELIQUIS 5 MG TWICE A DAY  Labwork: NONE  Testing/Procedures: Your physician has requested that you have an echocardiogram. Echocardiography is a painless test that uses sound waves to create images of your heart. It provides your doctor with information about the size and shape of your heart and how well your heart's chambers and valves are working. This procedure takes approximately one hour. There are no restrictions for this procedure.  Follow-Up: 3-4 WEEKS   Any Other Special Instructions Will Be Listed Below (If Applicable).  Atrial Fibrillation Atrial fibrillation is a type of irregular heart rhythm (arrhythmia). During atrial fibrillation, the upper chambers of the heart (atria) quiver continuously in a chaotic pattern. This causes an irregular and often rapid heart rate.  Atrial fibrillation is the result of the heart becoming overloaded with disorganized signals that tell it to beat. These signals are normally released one at a time by a part of the right atrium called the sinoatrial node. They then travel from the atria to the lower chambers of the heart (ventricles), causing the atria and ventricles to contract and pump blood as they pass. In atrial fibrillation, parts of the atria outside of the sinoatrial node also release these signals. This results in two problems. First, the atria receive so many signals that they do not have time to fully contract. Second, the ventricles, which can only receive one signal at a time, beat irregularly and out of rhythm with the atria.  There are three types of atrial fibrillation:   Paroxysmal. Paroxysmal atrial fibrillation starts suddenly and stops on its own within a week.  Persistent. Persistent atrial fibrillation lasts for more than a week. It may stop on its own or with treatment.  Permanent. Permanent atrial fibrillation does not go away. Episodes of atrial fibrillation may lead to  permanent atrial fibrillation. Atrial fibrillation can prevent your heart from pumping blood normally. It increases your risk of stroke and can lead to heart failure.  CAUSES   Heart conditions, including a heart attack, heart failure, coronary artery disease, and heart valve conditions.   Inflammation of the sac that surrounds the heart (pericarditis).  Blockage of an artery in the lungs (pulmonary embolism).  Pneumonia or other infections.  Chronic lung disease.  Thyroid problems, especially if the thyroid is overactive (hyperthyroidism).  Caffeine, excessive alcohol use, and use of some illegal drugs.   Use of some medicines, including certain decongestants and diet pills.  Heart surgery.   Birth defects.  Sometimes, no cause can be found. When this happens, the atrial fibrillation is called lone atrial fibrillation. The risk of complications from atrial fibrillation increases if you have lone atrial fibrillation and you are age 78 years or older. RISK FACTORS  Heart failure.  Coronary artery disease.  Diabetes mellitus.   High blood pressure (hypertension).   Obesity.   Other arrhythmias.   Increased age. SIGNS AND SYMPTOMS   A feeling that your heart is beating rapidly or irregularly.   A feeling of discomfort or pain in your chest.   Shortness of breath.   Sudden light-headedness or weakness.   Getting tired easily when exercising.   Urinating more often than normal (mainly when atrial fibrillation first begins).  In paroxysmal atrial fibrillation, symptoms may start and suddenly stop. DIAGNOSIS  Your health care provider may be able to detect atrial fibrillation when taking your pulse. Your health care provider may have you take a test called an ambulatory  electrocardiogram (ECG). An ECG records your heartbeat patterns over a 24-hour period. You may also have other tests, such as:  Transthoracic echocardiogram (TTE). During  echocardiography, sound waves are used to evaluate how blood flows through your heart.  Transesophageal echocardiogram (TEE).  Stress test. There is more than one type of stress test. If a stress test is needed, ask your health care provider about which type is best for you.  Chest X-ray exam.  Blood tests.  Computed tomography (CT). TREATMENT  Treatment may include:  Treating any underlying conditions. For example, if you have an overactive thyroid, treating the condition may correct atrial fibrillation.  Taking medicine. Medicines may be given to control a rapid heart rate or to prevent blood clots, heart failure, or a stroke.  Having a procedure to correct the rhythm of the heart:  Electrical cardioversion. During electrical cardioversion, a controlled, low-energy shock is delivered to the heart through your skin. If you have chest pain, very low blood pressure, or sudden heart failure, this procedure may need to be done as an emergency.  Catheter ablation. During this procedure, heart tissues that send the signals that cause atrial fibrillation are destroyed.  Surgical ablation. During this surgery, thin lines of heart tissue that carry the abnormal signals are destroyed. This procedure can either be an open-heart surgery or a minimally invasive surgery. With the minimally invasive surgery, small cuts are made to access the heart instead of a large opening.  Pulmonary venous isolation. During this surgery, tissue around the veins that carry blood from the lungs (pulmonary veins) is destroyed. This tissue is thought to carry the abnormal signals. HOME CARE INSTRUCTIONS   Take medicines only as directed by your health care provider. Some medicines can make atrial fibrillation worse or recur.  If blood thinners were prescribed by your health care provider, take them exactly as directed. Too much blood-thinning medicine can cause bleeding. If you take too little, you will not have the  needed protection against stroke and other problems.  Perform blood tests at home if directed by your health care provider. Perform blood tests exactly as directed.  Quit smoking if you smoke.  Do not drink alcohol.  Do not drink caffeinated beverages such as coffee, soda, and some teas. You may drink decaffeinated coffee, soda, or tea.   Maintain a healthy weight.Do not use diet pills unless your health care provider approves. They may make heart problems worse.   Follow diet instructions as directed by your health care provider.  Exercise regularly as directed by your health care provider.  Keep all follow-up visits as directed by your health care provider. This is important. PREVENTION  The following substances can cause atrial fibrillation to recur:   Caffeinated beverages.  Alcohol.  Certain medicines, especially those used for breathing problems.  Certain herbs and herbal medicines, such as those containing ephedra or ginseng.  Illegal drugs, such as cocaine and amphetamines. Sometimes medicines are given to prevent atrial fibrillation from recurring. Proper treatment of any underlying condition is also important in helping prevent recurrence.  SEEK MEDICAL CARE IF:  You notice a change in the rate, rhythm, or strength of your heartbeat.  You suddenly begin urinating more frequently.  You tire more easily when exerting yourself or exercising. SEEK IMMEDIATE MEDICAL CARE IF:   You have chest pain, abdominal pain, sweating, or weakness.  You feel nauseous.  You have shortness of breath.  You suddenly have swollen feet and ankles.  You feel dizzy.  Your face or limbs feel numb or weak.  You have a change in your vision or speech. MAKE SURE YOU:   Understand these instructions.  Will watch your condition.  Will get help right away if you are not doing well or get worse. Document Released: 04/07/2005 Document Revised: 08/22/2013 Document Reviewed:  05/18/2012 [facility] Patient Information 2015 [place], [place]. This information is not intended to replace advice given to you by your health care provider. Make sure you discuss any questions you have with your health care provider.

## 2014-09-11 ENCOUNTER — Other Ambulatory Visit: Payer: Self-pay | Admitting: *Deleted

## 2014-09-11 ENCOUNTER — Ambulatory Visit (HOSPITAL_COMMUNITY): Payer: Medicare Other | Attending: Cardiology

## 2014-09-11 ENCOUNTER — Ambulatory Visit
Admission: RE | Admit: 2014-09-11 | Discharge: 2014-09-11 | Disposition: A | Discharge status: Home / Self Care | Payer: Medicare Other | Source: Ambulatory Visit | Attending: Cardiology | Admitting: Cardiology

## 2014-09-11 ENCOUNTER — Other Ambulatory Visit: Payer: Self-pay

## 2014-09-11 ENCOUNTER — Other Ambulatory Visit: Payer: Self-pay | Admitting: Internal Medicine

## 2014-09-11 DIAGNOSIS — I4819 Other persistent atrial fibrillation: Secondary | ICD-10-CM

## 2014-09-11 DIAGNOSIS — I517 Cardiomegaly: Secondary | ICD-10-CM | POA: Diagnosis not present

## 2014-09-11 DIAGNOSIS — G4733 Obstructive sleep apnea (adult) (pediatric): Secondary | ICD-10-CM | POA: Insufficient documentation

## 2014-09-11 DIAGNOSIS — I481 Persistent atrial fibrillation: Secondary | ICD-10-CM | POA: Insufficient documentation

## 2014-09-11 DIAGNOSIS — I10 Essential (primary) hypertension: Secondary | ICD-10-CM | POA: Diagnosis not present

## 2014-09-11 DIAGNOSIS — Z87891 Personal history of nicotine dependence: Secondary | ICD-10-CM | POA: Diagnosis not present

## 2014-09-11 DIAGNOSIS — E785 Hyperlipidemia, unspecified: Secondary | ICD-10-CM | POA: Diagnosis not present

## 2014-09-11 DIAGNOSIS — I4891 Unspecified atrial fibrillation: Secondary | ICD-10-CM | POA: Diagnosis not present

## 2014-10-02 ENCOUNTER — Other Ambulatory Visit: Payer: Self-pay | Admitting: Internal Medicine

## 2014-10-02 NOTE — Telephone Encounter (Signed)
Zovirax rx sent to pharm

## 2014-10-09 ENCOUNTER — Ambulatory Visit (INDEPENDENT_AMBULATORY_CARE_PROVIDER_SITE_OTHER): Payer: Medicare Other | Admitting: Cardiology

## 2014-10-09 ENCOUNTER — Encounter: Payer: Self-pay | Admitting: Cardiology

## 2014-10-09 VITALS — BP 136/78 | HR 73 | Ht 69.0 in | Wt 200.0 lb

## 2014-10-09 DIAGNOSIS — E7800 Pure hypercholesterolemia, unspecified: Secondary | ICD-10-CM

## 2014-10-09 DIAGNOSIS — I4819 Other persistent atrial fibrillation: Secondary | ICD-10-CM

## 2014-10-09 DIAGNOSIS — I481 Persistent atrial fibrillation: Secondary | ICD-10-CM

## 2014-10-09 DIAGNOSIS — I10 Essential (primary) hypertension: Secondary | ICD-10-CM

## 2014-10-09 LAB — CBC WITH DIFFERENTIAL/PLATELET
Basophils Absolute: 0 10*3/uL (ref 0.0–0.1)
Basophils Relative: 0.4 % (ref 0.0–3.0)
Eosinophils Absolute: 0.2 10*3/uL (ref 0.0–0.7)
Eosinophils Relative: 2.8 % (ref 0.0–5.0)
HEMATOCRIT: 45.9 % (ref 39.0–52.0)
Hemoglobin: 15.4 g/dL (ref 13.0–17.0)
LYMPHS ABS: 1.7 10*3/uL (ref 0.7–4.0)
Lymphocytes Relative: 30 % (ref 12.0–46.0)
MCHC: 33.5 g/dL (ref 30.0–36.0)
MCV: 95.4 fl (ref 78.0–100.0)
MONO ABS: 0.7 10*3/uL (ref 0.1–1.0)
Monocytes Relative: 11.4 % (ref 3.0–12.0)
Neutro Abs: 3.2 10*3/uL (ref 1.4–7.7)
Neutrophils Relative %: 55.4 % (ref 43.0–77.0)
PLATELETS: 203 10*3/uL (ref 150.0–400.0)
RBC: 4.82 Mil/uL (ref 4.22–5.81)
RDW: 13.5 % (ref 11.5–15.5)
WBC: 5.8 10*3/uL (ref 4.0–10.5)

## 2014-10-09 LAB — BASIC METABOLIC PANEL
BUN: 19 mg/dL (ref 6–23)
CHLORIDE: 102 meq/L (ref 96–112)
CO2: 30 meq/L (ref 19–32)
Calcium: 10.3 mg/dL (ref 8.4–10.5)
Creatinine, Ser: 1.07 mg/dL (ref 0.40–1.50)
GFR: 71.94 mL/min (ref >60.00)
GLUCOSE: 101 mg/dL — AB (ref 70–99)
POTASSIUM: 3.6 meq/L (ref 3.5–5.1)
SODIUM: 136 meq/L (ref 135–145)

## 2014-10-09 LAB — PROTIME-INR
INR: 1.1 ratio — ABNORMAL HIGH (ref 0.8–1.0)
Prothrombin Time: 11.8 s (ref 9.6–13.1)

## 2014-10-09 LAB — APTT: APTT: 29.3 s (ref 23.4–32.7)

## 2014-10-09 NOTE — Progress Notes (Signed)
Cardiology Office Note   Date:  10/09/2014   ID:  Daniel, Tucker Jan 28, 1941, MRN [id number]  PCP:  Daniel Cobble, MD  Cardiologist: Darlin Coco MD  No chief complaint on file.     History of Present Illness: Daniel Tucker is a 74 y.o. male who presents for follow-up on his atrial fibrillation Daniel Tucker is a 74 y.o. male who presents for evaluation of recent onset atrial fibrillation. This gentleman is a medical patient of Dr. Unice Tucker. The patient had a recent annual physical examination and at that time was noted to have an irregular pulse. EKG done on that date, 08/24/14, showed atrial fibrillation with controlled ventricular response. The duration of his atrial fibrillation is not known. The patient thinks that for several months prior to his physical exam that his pulse was running faster. On the night before his physical examination he asked his wife was a nurse to check his pulse and she noted that he was irregular and she suspected atrial fibrillation. The patient has had some mild shortness of breath climbing hills which has been worse in the past several months. He has not been expressing any chest pain or angina. His last chest x-ray was in 2012 and was normal at that time. Patient has a history of a known asymptomatic right bundle branch block. He has a history of hypertension and elevated blood sugar. He has not had any TIA symptoms or dizzy spells. Patient has been on Apixaban 5 mg twice a day for the past month.  He has not missed any doses. Echocardiogram on 09/11/14 shows ejection fraction 55-60% and slight mitral regurgitation.  Past Medical History  Diagnosis Date  . Tubular adenoma of colon 2006    Dr Fuller Plan  . Sleep apnea     S/P oral surgery   . Unspecified essential hypertension   . Fasting hyperglycemia 2006    FBS 111; normal A1c  . Hyperlipidemia   . Atrial fibrillation 08/24/2014    Past Surgical History  Procedure  Laterality Date  . Uvulopalatopharyngoplasty  1997    Dr.Woliki  . Nasal sinus surgery    . Tonsillectomy  1997  . Colonoscopy w/ polypectomy  2006    [facility]  . Colonoscopy  2011    no polyp  . Prostate biopsy  2010    Negative,Dr.Ottelin  . 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 acyclovir (ZOVIRAX) 200 MG capsule Take 400 mg by mouth daily.    Marland Kitchen amLODipine (NORVASC) 5 MG tablet Take 5 mg by mouth daily.    Marland Kitchen apixaban (ELIQUIS) 5 MG TABS tablet Take 1 tablet (5 mg total) by mouth 2 (two) times daily. 60 tablet 5  . Ascorbic Acid (VITAMIN C) 1000 MG tablet Take 1,000 mg by mouth daily.      Marland Kitchen GLUCOSAMINE SULFATE PO Take 2 tablets by mouth daily. On three months/ off three months    . metoprolol (LOPRESSOR) 50 MG tablet Take 0.5 tablets (25 mg total) by mouth 2 (two) times daily. 180 tablet 3  . Multiple Vitamin (MULTIVITAMIN) tablet Take 1 tablet by mouth daily.     . pravastatin (PRAVACHOL) 20 MG tablet Take 20 mg by mouth daily.     No current facility-administered medications for this visit.    Allergies:   Review of patient's allergies indicates no known allergies.  Social History:  The patient  reports that he quit smoking about 46 years ago. He does not have any smokeless tobacco history on file. He reports that he drinks alcohol. He reports that he does not use illicit drugs.   Family History:  The patient's family history includes Atrial fibrillation in his brother; Hypertension in his father and mother; Lupus in his sister; Melanoma in his brother and sister; Prostate cancer in his brother and father. There is no history of Diabetes, Heart disease, or Stroke.    ROS:  Please see the history of present illness.   Otherwise, review of systems are positive for none.   All other systems are reviewed and negative.    PHYSICAL EXAM: VS:  BP 136/78 mmHg  Pulse 73  Ht 5\' 9"  (1.753 m)  Wt  200 lb (90.719 kg)  BMI 29.52 kg/m2 , BMI Body mass index is 29.52 kg/(m^2). GEN: Well nourished, well developed, in no acute distress HEENT: normal Neck: no JVD, carotid bruits, or masses Cardiac: Irregularly irregular; no murmurs, rubs, or gallops,no edema  Respiratory:  clear to auscultation bilaterally, normal work of breathing GI: soft, nontender, nondistended, + BS MS: no deformity or atrophy Skin: warm and dry, no rash Neuro:  Strength and sensation are intact Psych: euthymic mood, full affect   EKG:  EKG is ordered today. The ekg ordered today demonstrates atrial fibrillation with controlled ventricular response.  His prior tracing of 09/08/14, no significant change   Recent Labs: 08/24/2014: ALT 16; BUN 21; Creatinine, Ser 1.14; Hemoglobin 16.6; Platelets 236.0; Potassium 4.1; Sodium 138; TSH 1.21    Lipid Panel    Component Value Date/Time   CHOL 162 08/24/2014 1042   CHOL 155 08/17/2013 1126   TRIG 77 08/24/2014 1042   TRIG 91.0 08/17/2013 1126   HDL 50 08/24/2014 1042   HDL 45.80 08/17/2013 1126   CHOLHDL 3 08/17/2013 1126   VLDL 18.2 08/17/2013 1126   LDLCALC 97 08/24/2014 1042   LDLCALC 91 08/17/2013 1126      Wt Readings from Last 3 Encounters:  10/09/14 200 lb (90.719 kg)  09/08/14 198 lb (89.812 kg)  08/24/14 196 lb (88.905 kg)         ASSESSMENT AND PLAN:  1. Atrial fibrillation, probably present for several months by history. He is chads-vasc score of 3 for age, hypertension, and diabetes. He has been on Apixaban for one month 2. Essential hypertension 3. Bifascicular block 4. Elevated blood sugar   Current medicines are reviewed at length with the patient today.  The patient does not have concerns regarding medicines.  The following changes have been made:  no change  Labs/ tests ordered today include:   Orders Placed This Encounter  Procedures  . Basic metabolic panel  . CBC with Differential/Platelet  . PTT  . INR/PT  . EKG  12-Lead   Plan: We will proceed with direct current cardioversion.  This has been scheduled for 12 noon on 10/12/14.  He will get preoperative labs today.  We will arrange for a follow-up office visit after he returns to [place] following a trip to [place].  We will plan to see him on July 7.  Berna Spare MD 10/09/2014 11:50 AM    [address], [place], [place]  [address] Phone: [phone]; Fax: [phone]

## 2014-10-09 NOTE — Progress Notes (Signed)
Quick Note:  Please report to patient. The recent labs are stable. Continue same medication and careful diet. Labs okay for cardioversion. ______

## 2014-10-09 NOTE — Patient Instructions (Signed)
Medication Instructions:  Your physician recommends that you continue on your current medications as directed. Please refer to the Current Medication list given to you today.  Labwork: Bmet/cbc/pt/inr/ptt  Testing/Procedures: none  Follow-Up: July 7   Any Other Special Instructions Will Be Listed Below (If Applicable).   Your provider has recommended a cardioversion.   You are scheduled for a cardioversion on 10/12/14 at 12 noon with Dr. Mare Ferrari or associates. Please go to [facility] [place] [facility] at 10:30 am  Enter through the [place] not have any food or drink after midnight night before procedure.  You may take your Metoprolol and Eiquist with a sip of water on the day of your procedure.  You will need someone to drive you home following your procedure.   Call the [place] office at [phone] if you have any questions, problems or concerns.     Electrical Cardioversion Electrical cardioversion is the delivery of a jolt of electricity to change the rhythm of the heart. Sticky patches or metal paddles are placed on the chest to deliver the electricity from a device. This is done to restore a normal rhythm. A rhythm that is too fast or not regular keeps the heart from pumping well. Electrical cardioversion is done in an emergency if:   There is low or no blood pressure as a result of the heart rhythm.   Normal rhythm must be restored as fast as possible to protect the brain and heart from further damage.   It may save a life. Cardioversion may be done for heart rhythms that are not immediately life threatening, such as atrial fibrillation or flutter, in which:   The heart is beating too fast or is not regular.   Medicine to change the rhythm has not worked.   It is safe to wait in order to allow time for preparation.  Symptoms of the abnormal rhythm are bothersome.  The risk of stroke and other serious  problems can be reduced.  LET [facility] CARE PROVIDER KNOW ABOUT:   Any allergies you have.  All medicines you are taking, including vitamins, herbs, eye drops, creams, and over-the-counter medicines.  Previous problems you or members of your family have had with the use of anesthetics.   Any blood disorders you have.   Previous surgeries you have had.   Medical conditions you have.   RISKS AND COMPLICATIONS  Generally, this is a safe procedure. However, problems can occur and include:   Breathing problems related to the anesthetic used.  A blood clot that breaks free and travels to other parts of your body. This could cause a stroke or other problems. The risk of this is lowered by use of blood-thinning medicine (anticoagulant) prior to the procedure.  Cardiac arrest (rare).   BEFORE THE PROCEDURE   You may have tests to detect blood clots in your heart and to evaluate heart function.  You may start taking anticoagulants so your blood does not clot as easily.   Medicines may be given to help stabilize your heart rate and rhythm.   PROCEDURE  You will be given medicine through an IV tube to reduce discomfort and make you sleepy (sedative).   An electrical shock will be delivered.   AFTER THE PROCEDURE Your heart rhythm will be watched to make sure it does not change. You will need someone to drive you home.

## 2014-10-11 ENCOUNTER — Ambulatory Visit: Payer: Medicare Other | Admitting: Cardiology

## 2014-10-12 ENCOUNTER — Ambulatory Visit (HOSPITAL_COMMUNITY): Payer: Medicare Other | Admitting: Certified Registered Nurse Anesthetist

## 2014-10-12 ENCOUNTER — Encounter (HOSPITAL_COMMUNITY)
Admission: RE | Disposition: A | Discharge status: Home / Self Care | Payer: Self-pay | Source: Ambulatory Visit | Attending: Cardiology

## 2014-10-12 ENCOUNTER — Encounter (HOSPITAL_COMMUNITY): Payer: Self-pay | Admitting: Certified Registered Nurse Anesthetist

## 2014-10-12 ENCOUNTER — Ambulatory Visit (HOSPITAL_COMMUNITY)
Admission: RE | Admit: 2014-10-12 | Discharge: 2014-10-12 | Disposition: A | Discharge status: Home / Self Care | Payer: Medicare Other | Source: Ambulatory Visit | Attending: Cardiology | Admitting: Cardiology

## 2014-10-12 DIAGNOSIS — I4891 Unspecified atrial fibrillation: Secondary | ICD-10-CM | POA: Diagnosis not present

## 2014-10-12 DIAGNOSIS — Z87891 Personal history of nicotine dependence: Secondary | ICD-10-CM | POA: Insufficient documentation

## 2014-10-12 DIAGNOSIS — E1165 Type 2 diabetes mellitus with hyperglycemia: Secondary | ICD-10-CM | POA: Insufficient documentation

## 2014-10-12 DIAGNOSIS — E785 Hyperlipidemia, unspecified: Secondary | ICD-10-CM | POA: Diagnosis not present

## 2014-10-12 DIAGNOSIS — I10 Essential (primary) hypertension: Secondary | ICD-10-CM | POA: Insufficient documentation

## 2014-10-12 DIAGNOSIS — I452 Bifascicular block: Secondary | ICD-10-CM | POA: Insufficient documentation

## 2014-10-12 DIAGNOSIS — I481 Persistent atrial fibrillation: Secondary | ICD-10-CM | POA: Diagnosis not present

## 2014-10-12 DIAGNOSIS — I4819 Other persistent atrial fibrillation: Secondary | ICD-10-CM | POA: Insufficient documentation

## 2014-10-12 HISTORY — PX: CARDIOVERSION: SHX1299

## 2014-10-12 SURGERY — CARDIOVERSION
Anesthesia: General

## 2014-10-12 MED ORDER — PROMETHAZINE HCL 25 MG/ML IJ SOLN: 6.2500 mg | INTRAMUSCULAR | Status: DC | PRN

## 2014-10-12 MED ORDER — MEPERIDINE HCL 100 MG/ML IJ SOLN: 6.2500 mg | INTRAMUSCULAR | Status: DC | PRN

## 2014-10-12 MED ORDER — KETOROLAC TROMETHAMINE 30 MG/ML IJ SOLN
30.0000 mg | Freq: Once | INTRAMUSCULAR | Status: DC | PRN
  Filled 2014-10-12: qty 1

## 2014-10-12 MED ORDER — PROPOFOL 10 MG/ML IV BOLUS
INTRAVENOUS | Status: DC | PRN
  Administered 2014-10-12: 10 mg via INTRAVENOUS
  Administered 2014-10-12: 60 mg via INTRAVENOUS

## 2014-10-12 MED ORDER — HYDROMORPHONE HCL 1 MG/ML IJ SOLN: 0.2500 mg | INTRAMUSCULAR | Status: DC | PRN

## 2014-10-12 MED ORDER — SODIUM CHLORIDE 0.9 % IV SOLN
INTRAVENOUS | Status: DC
  Administered 2014-10-12 (×3): via INTRAVENOUS

## 2014-10-12 MED ORDER — LIDOCAINE HCL (CARDIAC) 20 MG/ML IV SOLN
INTRAVENOUS | Status: DC | PRN
  Administered 2014-10-12: 50 mg via INTRAVENOUS

## 2014-10-12 NOTE — Anesthesia Preprocedure Evaluation (Signed)
Anesthesia Evaluation  Patient identified by MRN, date of birth, ID band Patient awake    Reviewed: Allergy & Precautions, NPO status , Patient's Chart, lab work & pertinent test results  Airway Mallampati: II  TM Distance: >3 FB Neck ROM: Full    Dental no notable dental hx.    Pulmonary sleep apnea , former smoker,  breath sounds clear to auscultation  Pulmonary exam normal       Cardiovascular hypertension, Pt. on home beta blockers Normal cardiovascular examRhythm:Regular Rate:Normal     Neuro/Psych negative neurological ROS  negative psych ROS   GI/Hepatic negative GI ROS, Neg liver ROS,   Endo/Other  negative endocrine ROS  Renal/GU negative Renal ROS     Musculoskeletal negative musculoskeletal ROS (+)   Abdominal   Peds  Hematology negative hematology ROS (+)   Anesthesia Other Findings   Reproductive/Obstetrics                             Anesthesia Physical Anesthesia Plan  ASA: II  Anesthesia Plan: General   Post-op Pain Management:    Induction: Intravenous  Airway Management Planned:   Additional Equipment:   Intra-op Plan:   Post-operative Plan:   Informed Consent: I have reviewed the patients History and Physical, chart, labs and discussed the procedure including the risks, benefits and alternatives for the proposed anesthesia with the patient or authorized representative who has indicated his/her understanding and acceptance.   Dental advisory given  Plan Discussed with: CRNA  Anesthesia Plan Comments:         Anesthesia Quick Evaluation

## 2014-10-12 NOTE — Anesthesia Postprocedure Evaluation (Signed)
Anesthesia Post Note  Patient: Daniel Tucker  Procedure(s) Performed: Procedure(s) (LRB): CARDIOVERSION (N/A)  Anesthesia type: General  Patient location: PACU  Post pain: Pain level controlled  Post assessment: Post-op Vital signs reviewed  Last Vitals: BP 152/94 mmHg  Pulse 69  Temp(Src) 36.5 C (Oral)  Resp 17  SpO2 99%  Post vital signs: Reviewed  Level of consciousness: sedated  Complications: No apparent anesthesia complications

## 2014-10-12 NOTE — Anesthesia Procedure Notes (Signed)
Date/Time: 10/12/2014 12:03 PM Performed by: Willeen Cass P Pre-anesthesia Checklist: Patient identified, Timeout performed, Emergency Drugs available, Suction available and Patient being monitored Patient Re-evaluated:Patient Re-evaluated prior to inductionOxygen Delivery Method: Ambu bag Preoxygenation: Pre-oxygenation with 100% oxygen Intubation Type: IV induction Ventilation: Mask ventilation without difficulty Dental Injury: Teeth and Oropharynx as per pre-operative assessment

## 2014-10-12 NOTE — Discharge Instructions (Signed)

## 2014-10-12 NOTE — CV Procedure (Signed)
Electrical Cardioversion Procedure Note AHMANI PREHN [id number] 08/20/1940  Procedure: Electrical Cardioversion Indications:  Atrial Fibrillation  Procedure Details Consent: Risks of procedure as well as the alternatives and risks of each were explained to the (patient/caregiver).  Consent for procedure obtained. Time Out: Verified patient identification, verified procedure, site/side was marked, verified correct patient position, special equipment/implants available, medications/allergies/relevent history reviewed, required imaging and test results available.  Performed  Patient placed on cardiac monitor, pulse oximetry, supplemental oxygen as necessary.  Sedation given: propofol Pacer pads placed anterior and posterior chest.  Cardioverted 1 time(s).  Cardioverted at 120J.  Evaluation Findings: Post procedure EKG shows: NSR Complications: None Patient did tolerate procedure well.   Darlin Coco MD  10/12/2014, 12:13 PM

## 2014-10-12 NOTE — Transfer of Care (Signed)
Immediate Anesthesia Transfer of Care Note  Patient: Daniel Tucker  Procedure(s) Performed: Procedure(s): CARDIOVERSION (N/A)  Patient Location: PACU and Endoscopy Unit  Anesthesia Type:General  Level of Consciousness: awake, patient cooperative and lethargic  Airway & Oxygen Therapy: Patient Spontanous Breathing and Patient connected to nasal cannula oxygen  Post-op Assessment: Report given to RN, Post -op Vital signs reviewed and stable and Patient moving all extremities  Post vital signs: Reviewed and stable  Last Vitals:  Filed Vitals:   10/12/14 1039  BP: 159/99  Temp: 36.5 C  Resp: 20    Complications: No apparent anesthesia complications

## 2014-10-12 NOTE — H&P (Signed)
The patient presents for elective direct-current cardioversion.  His history is as outlined below:  Daniel Tucker is a 74 y.o. male who presents for evaluation of recent onset atrial fibrillation. This gentleman is a medical patient of Dr. Unice Cobble. The patient had a recent annual physical examination and at that time was noted to have an irregular pulse. EKG done on that date, 08/24/14, showed atrial fibrillation with controlled ventricular response. The duration of his atrial fibrillation is not known. The patient thinks that for several months prior to his physical exam that his pulse was running faster. On the night before his physical examination he asked his wife was a nurse to check his pulse and she noted that he was irregular and she suspected atrial fibrillation. The patient has had some mild shortness of breath climbing hills which has been worse in the past several months. He has not been expressing any chest pain or angina. His last chest x-ray was in 2012 and was normal at that time. Patient has a history of a known asymptomatic right bundle branch block. He has a history of hypertension and elevated blood sugar. He has not had any TIA symptoms or dizzy spells. Patient has been on Apixaban 5 mg twice a day for the past month. He has not missed any doses. Echocardiogram on 09/11/14 shows ejection fraction 55-60% and slight mitral regurgitation.  Past Medical History  Diagnosis Date  . Tubular adenoma of colon 2006    Dr Fuller Plan  . Sleep apnea     S/P oral surgery   . Unspecified essential hypertension   . Fasting hyperglycemia 2006    FBS 111; normal A1c  . Hyperlipidemia   . Atrial fibrillation 08/24/2014    Past Surgical History  Procedure Laterality Date  . Uvulopalatopharyngoplasty  1997    Dr.Woliki  . Nasal sinus surgery    . Tonsillectomy  1997  . Colonoscopy w/ polypectomy  2006    [place]  .  Colonoscopy  2011    no polyp  . Prostate biopsy  2010    Negative,Dr.Ottelin  . 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 acyclovir (ZOVIRAX) 200 MG capsule Take 400 mg by mouth daily.    Marland Kitchen amLODipine (NORVASC) 5 MG tablet Take 5 mg by mouth daily.    Marland Kitchen apixaban (ELIQUIS) 5 MG TABS tablet Take 1 tablet (5 mg total) by mouth 2 (two) times daily. 60 tablet 5  . Ascorbic Acid (VITAMIN C) 1000 MG tablet Take 1,000 mg by mouth daily.     Marland Kitchen GLUCOSAMINE SULFATE PO Take 2 tablets by mouth daily. On three months/ off three months    . metoprolol (LOPRESSOR) 50 MG tablet Take 0.5 tablets (25 mg total) by mouth 2 (two) times daily. 180 tablet 3  . Multiple Vitamin (MULTIVITAMIN) tablet Take 1 tablet by mouth daily.     . pravastatin (PRAVACHOL) 20 MG tablet Take 20 mg by mouth daily.     No current facility-administered medications for this visit.    Allergies: Review of patient's allergies indicates no known allergies.    Social History: The patient  reports that he quit smoking about 46 years ago. He does not have any smokeless tobacco history on file. He reports that he drinks alcohol. He reports that he does not use illicit drugs.   Family History: The patient's family history includes Atrial fibrillation  in his brother; Hypertension in his father and mother; Lupus in his sister; Melanoma in his brother and sister; Prostate cancer in his brother and father. There is no history of Diabetes, Heart disease, or Stroke.    ROS: Please see the history of present illness. Otherwise, review of systems are positive for none. All other systems are reviewed and negative.    PHYSICAL EXAM: VS: BP 136/78 mmHg  Pulse 73  Ht 5\' 9"  (1.753 m)  Wt 200 lb (90.719 kg)  BMI 29.52 kg/m2 , BMI Body mass index is 29.52 kg/(m^2). GEN: Well nourished,  well developed, in no acute distress  HEENT: normal  Neck: no JVD, carotid bruits, or masses Cardiac: Irregularly irregular; no murmurs, rubs, or gallops,no edema  Respiratory: clear to auscultation bilaterally, normal work of breathing GI: soft, nontender, nondistended, + BS MS: no deformity or atrophy  Skin: warm and dry, no rash Neuro: Strength and sensation are intact Psych: euthymic mood, full affect   EKG: EKG is ordered today. The ekg ordered today demonstrates atrial fibrillation with controlled ventricular response. His prior tracing of 09/08/14, no significant change   Recent Labs: 08/24/2014: ALT 16; BUN 21; Creatinine, Ser 1.14; Hemoglobin 16.6; Platelets 236.0; Potassium 4.1; Sodium 138; TSH 1.21    Lipid Panel  Labs (Brief)       Component Value Date/Time   CHOL 162 08/24/2014 1042   CHOL 155 08/17/2013 1126   TRIG 77 08/24/2014 1042   TRIG 91.0 08/17/2013 1126   HDL 50 08/24/2014 1042   HDL 45.80 08/17/2013 1126   CHOLHDL 3 08/17/2013 1126   VLDL 18.2 08/17/2013 1126   LDLCALC 97 08/24/2014 1042   LDLCALC 91 08/17/2013 1126       Wt Readings from Last 3 Encounters:  10/09/14 200 lb (90.719 kg)  09/08/14 198 lb (89.812 kg)  08/24/14 196 lb (88.905 kg)         ASSESSMENT AND PLAN:  1. Atrial fibrillation, probably present for several months by history. He is chads-vasc score of 3 for age, hypertension, and diabetes. He has been on Apixaban for one month 2. Essential hypertension 3. Bifascicular block 4. Elevated blood sugar   Current medicines are reviewed at length with the patient today. The patient does not have concerns regarding medicines.  The following changes have been made: no change  Labs/ tests ordered today include:  Orders Placed This Encounter  Procedures  . Basic metabolic panel  . CBC with Differential/Platelet  . PTT  . INR/PT  . EKG 12-Lead    Plan: We will proceed with direct current cardioversion. This has been scheduled for 12 noon on 10/12/14. He will get preoperative labs today. We will arrange for a follow-up office visit after he returns to [place] following a trip to [place]. We will plan to see him on July 7.

## 2014-10-13 ENCOUNTER — Encounter (HOSPITAL_COMMUNITY): Payer: Self-pay | Admitting: Cardiology

## 2014-10-26 ENCOUNTER — Ambulatory Visit (INDEPENDENT_AMBULATORY_CARE_PROVIDER_SITE_OTHER): Payer: Medicare Other | Admitting: Cardiology

## 2014-10-26 ENCOUNTER — Encounter: Payer: Self-pay | Admitting: Cardiology

## 2014-10-26 VITALS — BP 128/78 | HR 93 | Ht 69.0 in | Wt 200.0 lb

## 2014-10-26 DIAGNOSIS — I10 Essential (primary) hypertension: Secondary | ICD-10-CM

## 2014-10-26 DIAGNOSIS — E7800 Pure hypercholesterolemia, unspecified: Secondary | ICD-10-CM

## 2014-10-26 DIAGNOSIS — I4819 Other persistent atrial fibrillation: Secondary | ICD-10-CM

## 2014-10-26 DIAGNOSIS — I481 Persistent atrial fibrillation: Secondary | ICD-10-CM

## 2014-10-26 MED ORDER — METOPROLOL TARTRATE 50 MG PO TABS: 50.0000 mg | ORAL_TABLET | Freq: Two times a day (BID) | ORAL | Status: DC

## 2014-10-26 MED ORDER — APIXABAN 5 MG PO TABS: 5.0000 mg | ORAL_TABLET | Freq: Two times a day (BID) | ORAL | Status: DC

## 2014-10-26 NOTE — Patient Instructions (Addendum)
Medication Instructions:  INCREASE YOUR METOPROLOL TO 50 MG TWICE A DAY  Labwork: NONE  Testing/Procedures: NONE  Follow-Up: Your physician wants you to follow-up in: 4 MONTH OV/EKG  You will receive a reminder letter in the mail two months in advance. If you don't receive a letter, please call our office to schedule the follow-up appointment.  Any Other Special Instructions Will Be Listed Below (If Applicable). WILL ARRANGE FOR YOU TO BE SEEN IN THE ATRIAL FIBRILLATION CLINIC

## 2014-10-26 NOTE — Progress Notes (Signed)
Cardiology Office Note   Date:  10/26/2014   ID:  [name], [name] 09/09/1940, MRN [id number]  PCP:  Unice Cobble, MD  Cardiologist: Darlin Coco MD  No chief complaint on file.     History of Present Illness: Daniel Tucker is a 74 y.o. male who presents for post cardioversion office visit.  This gentleman is a medical patient of Dr. Unice Cobble. The patient had a recent annual physical examination and at that time was noted to have an irregular pulse. EKG done on that date, 08/24/14, showed atrial fibrillation with controlled ventricular response. The duration of his atrial fibrillation is not known. The patient thinks that for several months prior to his physical exam that his pulse was running faster. On the night before his physical examination he asked his wife was a nurse to check his pulse and she noted that he was irregular and she suspected atrial fibrillation. The patient has had some mild shortness of breath climbing hills which has been worse in the past several months. He has not been expressing any chest pain or angina. His last chest x-ray was on 09/11/14 and showed mild cardiomegaly. Patient has a history of a known asymptomatic right bundle branch block. He has a history of hypertension and elevated blood sugar. He has not had any TIA symptoms or dizzy spells. Patient has been on Apixaban 5 mg twice a day for the past month. He has not missed any doses. Echocardiogram on 09/11/14 shows ejection fraction 55-60% and slight mitral regurgitation. The patient underwent elective cardioversion on 10/12/14 and converted to normal sinus rhythm with an initial shock of 120 Joules.  He states that he remained in normal rhythm for only 3 days after which he noticed that his heart was faster and irregular again.  He has not been experiencing any chest pain.  He does not think he has much change in stamina but there may be some decrease when he is not in normal  rhythm.  He has not had any TIA or stroke symptoms.  He is not having any bleeding problems from the Apixaban.  Past Medical History  Diagnosis Date  . Tubular adenoma of colon 2006    Dr Fuller Plan  . Sleep apnea     S/P oral surgery   . Unspecified essential hypertension   . Fasting hyperglycemia 2006    FBS 111; normal A1c  . Hyperlipidemia   . Atrial fibrillation 08/24/2014    Past Surgical History  Procedure Laterality Date  . Uvulopalatopharyngoplasty  1997    Dr.Woliki  . Nasal sinus surgery    . Tonsillectomy  1997  . Colonoscopy w/ polypectomy  2006    [place]  . Colonoscopy  2011    no polyp  . Prostate biopsy  2010    Negative,Dr.Ottelin  . 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 Cardioversion N/A 10/12/2014    Procedure: CARDIOVERSION;  Surgeon: Darlin Coco, MD;  Location: [facility] ENDOSCOPY;  Service: Cardiovascular;  Laterality: N/A;     Current Outpatient Prescriptions  Medication Sig Dispense Refill  . acetaminophen (TYLENOL) 500 MG chewable tablet Chew 500 mg by mouth every 6 (six) hours as needed for pain.    Marland Kitchen acyclovir (ZOVIRAX) 200 MG capsule Take 400 mg by mouth daily.    Marland Kitchen amLODipine (NORVASC) 5 MG tablet Take 5 mg by mouth daily.    Marland Kitchen apixaban (ELIQUIS) 5 MG TABS tablet Take 1 tablet (  5 mg total) by mouth 2 (two) times daily. 180 tablet 3  . Ascorbic Acid (VITAMIN C) 1000 MG tablet Take 1,000 mg by mouth daily.      Marland Kitchen GLUCOSAMINE SULFATE PO Take 2 tablets by mouth daily.     . metoprolol (LOPRESSOR) 50 MG tablet Take 1 tablet (50 mg total) by mouth 2 (two) times daily. 180 tablet 3  . Multiple Vitamin (MULTIVITAMIN) tablet Take 1 tablet by mouth daily.     . pravastatin (PRAVACHOL) 20 MG tablet Take 20 mg by mouth daily.     No current facility-administered medications for this visit.    Allergies:   Review of patient's allergies indicates no known allergies.    Social History:  The patient  reports that he quit smoking about 46 years  ago. He does not have any smokeless tobacco history on file. He reports that he drinks alcohol. He reports that he does not use illicit drugs.   Family History:  The patient's family history includes Atrial fibrillation in his brother; Hypertension in his father and mother; Lupus in his sister; Melanoma in his brother and sister; Prostate cancer in his brother and father. There is no history of Diabetes, Heart disease, or Stroke.    ROS:  Please see the history of present illness.   Otherwise, review of systems are positive for none.   All other systems are reviewed and negative.    PHYSICAL EXAM: VS:  BP 128/78 mmHg  Pulse 93  Ht 5\' 9"  (1.753 m)  Wt 200 lb (90.719 kg)  BMI 29.52 kg/m2 , BMI Body mass index is 29.52 kg/(m^2). GEN: Well nourished, well developed, in no acute distress HEENT: normal Neck: no JVD, carotid bruits, or masses Cardiac: Irregularly irregular; no murmurs, rubs, or gallops,no edema  Respiratory:  clear to auscultation bilaterally, normal work of breathing GI: soft, nontender, nondistended, + BS MS: no deformity or atrophy Skin: warm and dry, no rash Neuro:  Strength and sensation are intact Psych: euthymic mood, full affect   EKG:  EKG is ordered today. The ekg ordered today demonstrates atrial fibrillation at a heart rate of 93 bpm.  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  Since the previous EKG of 10/12/14, atrial fibrillation has returned.   Recent Labs: 08/24/2014: ALT 16; TSH 1.21 10/09/2014: BUN 19; Creatinine, Ser 1.07; Hemoglobin 15.4; Platelets 203.0; Potassium 3.6; Sodium 136    Lipid Panel    Component Value Date/Time   CHOL 162 08/24/2014 1042   CHOL 155 08/17/2013 1126   TRIG 77 08/24/2014 1042   TRIG 91.0 08/17/2013 1126   HDL 50 08/24/2014 1042   HDL 45.80 08/17/2013 1126   CHOLHDL 3 08/17/2013 1126   VLDL 18.2 08/17/2013 1126   LDLCALC 97 08/24/2014 1042   LDLCALC 91 08/17/2013 1126      Wt Readings  from Last 3 Encounters:  10/26/14 200 lb (90.719 kg)  10/09/14 200 lb (90.719 kg)  09/08/14 198 lb (89.812 kg)        ASSESSMENT AND PLAN:  1.  Recurrent persistent atrial fibrillation which occurred approximately 3 days after successful outpatient cardioversion. 2. Essential hypertension 3. Bifascicular block 4. Elevated blood sugar  Current medicines are reviewed at length with the patient today.  The patient does not have concerns regarding medicines.  The following changes have been made:  For better rate control we will increase his metoprolol to 50 mg twice a day.  Continue Apixaban  Labs/ tests  ordered today include:  Orders Placed This Encounter  Procedures  . Ambulatory referral to Cardiac Electrophysiology  . EKG 12-Lead     Disposition: We had a long conversation with the patient and his wife concerning possible next step.  One option is to accept atrial fibrillation and continue with rate control strategy and long-term anticoagulation.  The other would be to consider adding antiarrhythmics therapy and making further attempts at restoring normal sinus rhythm.  He is somewhat symptomatic but not terribly symptomatic from his atrial fibrillation.  We will refer him to the atrial fibrillation clinic for further consultation regarding options going forward. Recheck here in 4 months for office visit and EKG  Signed, Darlin Coco MD 10/26/2014 10:49 AM    Daniel Tucker, [place], [place]  [address] Phone: [phone]; Fax: [phone]

## 2014-10-30 ENCOUNTER — Ambulatory Visit: Payer: Medicare Other | Admitting: Cardiology

## 2014-10-31 ENCOUNTER — Telehealth: Payer: Self-pay | Admitting: Cardiology

## 2014-10-31 DIAGNOSIS — I4819 Other persistent atrial fibrillation: Secondary | ICD-10-CM

## 2014-10-31 DIAGNOSIS — Z8669 Personal history of other diseases of the nervous system and sense organs: Secondary | ICD-10-CM

## 2014-10-31 NOTE — Telephone Encounter (Signed)
Patient came in from working in yard Per wife patient stated he had been drinking plenty of fluids while outside (she thought probably about 1 bottle of water) Patient was sweating from working in th yard for about an hour and a half Patient drank fluid, ate, and took a nap Blood pressure systolic up to 355'D and heart rate down to 80's after nap Since increasing the Metoprolol this is the first time blood pressure low and heart rate elevated that they are aware of Will have patient to decrease his outside activity and make sure to drink plenty of water to stay hydrated Discussed with  Dr. Mare Ferrari and he agrees with plan   Also patient has history of sleep apnea and had surgery back in 2005 (patient thinks) Last night wife stated it look as though his sleep apnea back because there were several times that it looked as though he "quit" breathing Will arrange for patient to get a sleep study and follow up with Dr Radford Pax if needed

## 2014-10-31 NOTE — Telephone Encounter (Signed)
New message     Pt was working outside today and when he came back in his b/p dropped to 100/64 @ 2:45 and pulse was 108 at 2:45 At 2:50 today b/p was 99/74 and pulse was 105 at 2:50 Pt is concerned about lopressor dosage.  Please call to discuss

## 2014-10-31 NOTE — Telephone Encounter (Signed)
Follow Up       Pt's wife calling to provide [facility] with a different phone number to call her back at.

## 2014-11-02 DIAGNOSIS — M1611 Unilateral primary osteoarthritis, right hip: Secondary | ICD-10-CM | POA: Diagnosis not present

## 2014-11-03 NOTE — Telephone Encounter (Signed)
Patient has been scheduled for sleep study in September  Aware of date and time

## 2014-11-07 ENCOUNTER — Encounter (HOSPITAL_COMMUNITY): Payer: Self-pay | Admitting: Nurse Practitioner

## 2014-11-07 ENCOUNTER — Ambulatory Visit (HOSPITAL_COMMUNITY)
Admission: RE | Admit: 2014-11-07 | Discharge: 2014-11-07 | Disposition: A | Discharge status: Home / Self Care | Payer: Medicare Other | Source: Ambulatory Visit | Attending: Nurse Practitioner | Admitting: Nurse Practitioner

## 2014-11-07 VITALS — BP 150/90 | HR 76 | Ht 69.0 in | Wt 197.0 lb

## 2014-11-07 DIAGNOSIS — I4819 Other persistent atrial fibrillation: Secondary | ICD-10-CM

## 2014-11-07 DIAGNOSIS — I452 Bifascicular block: Secondary | ICD-10-CM | POA: Diagnosis not present

## 2014-11-07 DIAGNOSIS — I4891 Unspecified atrial fibrillation: Secondary | ICD-10-CM | POA: Diagnosis not present

## 2014-11-07 DIAGNOSIS — I481 Persistent atrial fibrillation: Secondary | ICD-10-CM

## 2014-11-09 ENCOUNTER — Encounter (HOSPITAL_COMMUNITY): Payer: Self-pay | Admitting: Nurse Practitioner

## 2014-11-09 NOTE — Progress Notes (Signed)
Patient ID: Daniel Tucker, male   DOB: 1940/12/27, 74 y.o.   MRN: [id number]    Primary Care Physician: Unice Cobble, MD Referring Physician:Dr. Elyas Villamor is a 74 y.o. male with a h/o HTN, HLD, sleep apnea, treated in the 80's with oral surgery, new onset afib that was found on physical exam in May of this year. It was rate controlled at the time. He was started on apixaban 5 mg bid and ser up for DCCV. Unfortunately has early return of afib after 3 days. He is mildly symptomatic with fatigue. Metoprolol was recently increased to 50 mg bid for better rate control. He is in the afib clinic today to discuss rate control options going forward.  He does not smoke or drink/ minimal caffeine use. He walks on a regular basis. He states he did not feel all that different after cardioversion but admits he may have noticed some mild fatigue. He is doing well on apixaban without any bleeding issues. 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 This would limit AAD options.  Today, he denies symptoms of palpitations, chest pain, shortness of breath, orthopnea, PND, lower extremity edema, dizziness, presyncope, syncope, or neurologic sequela. Mild fatigue. The patient is tolerating medications without difficulties and is otherwise without complaint today.   Past Medical History  Diagnosis Date  . Tubular adenoma of colon 2006    Dr Fuller Plan  . Sleep apnea     S/P oral surgery   . Unspecified essential hypertension   . Fasting hyperglycemia 2006    FBS 111; normal A1c  . Hyperlipidemia   . Atrial fibrillation 08/24/2014   Past Surgical History  Procedure Laterality Date  . Uvulopalatopharyngoplasty  1997    Dr.Woliki  . Nasal sinus surgery    . Tonsillectomy  1997  . Colonoscopy w/ polypectomy  2006    [place]  . Colonoscopy  2011    no polyp  . Prostate biopsy  2010    Negative,Dr.Ottelin  . 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  Cardioversion N/A 10/12/2014    Procedure: CARDIOVERSION;  Surgeon: Darlin Coco, MD;  Location: [facility] ENDOSCOPY;  Service: Cardiovascular;  Laterality: N/A;    Current Outpatient Prescriptions  Medication Sig Dispense Refill  . acetaminophen (TYLENOL) 500 MG chewable tablet Chew 500 mg by mouth every 6 (six) hours as needed for pain.    Marland Kitchen acyclovir (ZOVIRAX) 200 MG capsule Take 400 mg by mouth daily.    Marland Kitchen amLODipine (NORVASC) 5 MG tablet Take 5 mg by mouth daily.    Marland Kitchen apixaban (ELIQUIS) 5 MG TABS tablet Take 1 tablet (5 mg total) by mouth 2 (two) times daily. 180 tablet 3  . Ascorbic Acid (VITAMIN C) 1000 MG tablet Take 1,000 mg by mouth daily.      Marland Kitchen GLUCOSAMINE SULFATE PO Take 2 tablets by mouth daily.     . metoprolol (LOPRESSOR) 50 MG tablet Take 1 tablet (50 mg total) by mouth 2 (two) times daily. 180 tablet 3  . Multiple Vitamin (MULTIVITAMIN) tablet Take 1 tablet by mouth daily.     . Multiple Vitamins-Minerals (PRESERVISION AREDS 2 PO) Take 1 tablet by mouth 2 (two) times daily.    . pravastatin (PRAVACHOL) 20 MG tablet Take 20 mg by mouth daily.     No current facility-administered medications for this encounter.    No Known Allergies  History   Social  History  . Marital Status: Married    Spouse Name: N/A  . Number of Children: N/A  . Years of Education: N/A   Occupational History  . Not on file.   Social History Main Topics  . Smoking status: Former Smoker    Quit date: 04/21/1968  . Smokeless tobacco: Not on file     Comment: smoked age [age], up to 1ppd  . Alcohol Use: Yes     Comment: Rarely  . Drug Use: No  . Sexual Activity: Not on file   Other Topics Concern  . Not on file   Social History Narrative    Family History  Problem Relation Age of Onset  . Prostate cancer Father   . Hypertension Father   . Hypertension Mother   . Prostate cancer Brother   . Diabetes Neg Hx   . Heart disease Neg Hx   . Stroke Neg Hx   . Melanoma Sister   . Atrial  fibrillation Brother   . Melanoma Brother   . Lupus Sister     ROS- All systems are reviewed and negative except as per the HPI above  Physical Exam: Filed Vitals:   11/07/14 1034  BP: 150/90  Pulse: 76  Height: 5\' 9"  (1.753 m)  Weight: 197 lb (89.359 kg)    GEN- The patient is well appearing, alert and oriented x 3 today.   Head- normocephalic, atraumatic Eyes-  Sclera clear, conjunctiva pink Ears- hearing intact Oropharynx- clear Neck- supple, no JVP Lymph- no cervical lymphadenopathy Lungs- Clear to ausculation bilaterally, normal work of breathing Heart- Regular rate and rhythm, no murmurs, rubs or gallops, PMI not laterally displaced GI- soft, NT, ND, + BS Extremities- no clubbing, cyanosis, or edema MS- no significant deformity or atrophy Skin- no rash or lesion Psych- euthymic mood, full affect Neuro- strength and sensation are intact  EKG-Afib with rbbb, LAFB(bifasicular block),rate controlled at 76 bpm  Echo- 5/16-Left ventricle: The cavity size was mildly dilated. Wall thickness was normal. Systolic function was normal. The estimated ejection fraction was in the range of 55% to 60%. Wall motion was normal; there were no regional wall motion abnormalities. - Mitral valve: There was mild regurgitation. - Left atrium: The atrium was moderately to severely dilated. - Right atrium: The atrium was moderately dilated. - Pulmonary arteries: Systolic pressure was mildly increased. PA peak pressure: 42 mm Hg (S). Left atrium 50 mm  Labs- Creat 1.07, GFR 71.94, Kt 3.6, CBC normal, Hepatic function normal,thyroid panel normal  Assessment and Plan: 1. 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 One option is to continue with rate control.  RBBB excludes use of flecainide.  QTc is over 440 at baseline, which is limit for tikosyn, but if corrected for BBB, it may be less  than this and could consider tikosyn. Would have to review with EP MD. A good option may be loading on amiodarone as outpatient and then if drug does not convert pt, repeat DCCV. Risk vrs benefit of drug explained. Wife has been on amiodarone for years, so is aware of risks.. Will review with EP and then let pt know if tikosyn would even be an option, and see how pt would like to progress. Continue on apixaban for chadsvasc of at least two  2. HTN Mildly elevated  Pt states usually runs better at home  3. Sleep apnea Treated years ago with oral surgery Mild snoring reported  by wife Sleep study pending   I will discuss further with pt after input from EP MD.

## 2014-11-13 ENCOUNTER — Telehealth: Payer: Self-pay | Admitting: Cardiology

## 2014-11-13 NOTE — Telephone Encounter (Signed)
i talked to pt. BP have been slightly elevated. I am waiting to hear back from Dr. Shelton Silvas and best antiarrythmic to proceed with to help restore SR, due to baseline EKG changes. Told him I will try to have answer tomorrow. He is in agreement. The sluggish is probably from afib.

## 2014-11-13 NOTE — Telephone Encounter (Signed)
SPOKE WITH PT'S WIFE   B/P 'S OVER THE  LAST   COUPLE  OF WEEKS  HAVE BEEN RUNNING   BETWEEN  100 -148/64 -60 PT FEELS  SLUGGISH AND  TIRED  DISCUSSED  WITH  DONNA  CARROLL . DONNA.,  WILL REVIEW PT'S INFO   AND CALL PT  BACK WITH  NEW INSTRUCTIONS .[name]

## 2014-11-13 NOTE — Telephone Encounter (Signed)
New message    Wife calling  in Afib .   Pt C/O BP issue: STAT if pt C/O blurred vision, one-sided weakness or slurred speech  1. What are your last 5 BP readings? This am  10 @ am  134/98  Pulse 87  2. Are you having any other symptoms (ex. Dizziness, headache, blurred vision, passed out)? Sluggish and tired.    3. What is your BP issue? Discuss different option

## 2014-11-15 ENCOUNTER — Ambulatory Visit (HOSPITAL_COMMUNITY)
Admission: RE | Admit: 2014-11-15 | Discharge: 2014-11-15 | Disposition: A | Discharge status: Home / Self Care | Payer: Medicare Other | Source: Ambulatory Visit | Attending: Nurse Practitioner | Admitting: Nurse Practitioner

## 2014-11-15 ENCOUNTER — Encounter (HOSPITAL_COMMUNITY): Payer: Self-pay | Admitting: Nurse Practitioner

## 2014-11-15 ENCOUNTER — Other Ambulatory Visit: Payer: Self-pay

## 2014-11-15 DIAGNOSIS — I452 Bifascicular block: Secondary | ICD-10-CM | POA: Insufficient documentation

## 2014-11-15 DIAGNOSIS — I4891 Unspecified atrial fibrillation: Secondary | ICD-10-CM | POA: Diagnosis not present

## 2014-11-15 DIAGNOSIS — I481 Persistent atrial fibrillation: Secondary | ICD-10-CM

## 2014-11-15 LAB — BASIC METABOLIC PANEL
ANION GAP: 8 (ref 5–15)
BUN: 18 mg/dL (ref 6–20)
CO2: 26 mmol/L (ref 22–32)
Calcium: 9 mg/dL (ref 8.9–10.3)
Chloride: 101 mmol/L (ref 101–111)
Creatinine, Ser: 1.19 mg/dL (ref 0.61–1.24)
GFR calc Af Amer: >60 mL/min (ref >60)
GFR calc non Af Amer: 59 mL/min — ABNORMAL LOW (ref >60)
Glucose, Bld: 112 mg/dL — ABNORMAL HIGH (ref 65–99)
Potassium: 3.7 mmol/L (ref 3.5–5.1)
SODIUM: 135 mmol/L (ref 135–145)

## 2014-11-15 LAB — MAGNESIUM: MAGNESIUM: 2.2 mg/dL (ref 1.7–2.4)

## 2014-11-15 NOTE — Patient Instructions (Signed)
Gay Filler will be in touch with you regarding tikosyn appointment

## 2014-11-15 NOTE — Progress Notes (Addendum)
Patient in for ekg and lab work for baseline prior to initiation of antiarrhythmic medication.  Daniel Palau NP to review ekg.   Pt is here to f/u from last week to further discuss AAD's. Tikosyn vrs amiodarone discussed with pt. Pt would like to pursue Tikosyn. Ekg shows afib rate controlled with QTc of 435 ms. Will get baseline labs and send a staff message to Elberta Leatherwood, PharmD to schedule an appointment to initiate.

## 2014-11-16 ENCOUNTER — Telehealth: Payer: Self-pay | Admitting: Pharmacist

## 2014-11-16 MED ORDER — POTASSIUM CHLORIDE CRYS ER 20 MEQ PO TBCR: 20.0000 meq | EXTENDED_RELEASE_TABLET | Freq: Every day | ORAL | Status: DC

## 2014-11-16 NOTE — Telephone Encounter (Signed)
Pt called to schedule a date for Tikosyn initiation.  Will schedule for 8/1.  He also had a BMET and Mg done yesterday.  Mg was great at 2.2 but K was slightly low for Tikosyn.  Will have him start taking potassium 6mEq daily today to help ensure it is above 4.0 on Monday.

## 2014-11-20 ENCOUNTER — Ambulatory Visit (INDEPENDENT_AMBULATORY_CARE_PROVIDER_SITE_OTHER): Payer: Medicare Other | Admitting: Pharmacist

## 2014-11-20 ENCOUNTER — Encounter (HOSPITAL_COMMUNITY): Payer: Self-pay | Admitting: Nurse Practitioner

## 2014-11-20 ENCOUNTER — Inpatient Hospital Stay (HOSPITAL_COMMUNITY)
Admission: AD | Admit: 2014-11-20 | Discharge: 2014-11-23 | DRG: 310 | Disposition: A | Discharge status: Home / Self Care | Payer: Medicare Other | Source: Ambulatory Visit | Attending: Internal Medicine | Admitting: Internal Medicine

## 2014-11-20 DIAGNOSIS — E876 Hypokalemia: Secondary | ICD-10-CM | POA: Diagnosis present

## 2014-11-20 DIAGNOSIS — I481 Persistent atrial fibrillation: Secondary | ICD-10-CM

## 2014-11-20 DIAGNOSIS — E785 Hyperlipidemia, unspecified: Secondary | ICD-10-CM | POA: Diagnosis present

## 2014-11-20 DIAGNOSIS — Z96649 Presence of unspecified artificial hip joint: Secondary | ICD-10-CM | POA: Diagnosis present

## 2014-11-20 DIAGNOSIS — Z8042 Family history of malignant neoplasm of prostate: Secondary | ICD-10-CM

## 2014-11-20 DIAGNOSIS — Z87891 Personal history of nicotine dependence: Secondary | ICD-10-CM

## 2014-11-20 DIAGNOSIS — Z8249 Family history of ischemic heart disease and other diseases of the circulatory system: Secondary | ICD-10-CM | POA: Diagnosis not present

## 2014-11-20 DIAGNOSIS — I10 Essential (primary) hypertension: Secondary | ICD-10-CM | POA: Diagnosis present

## 2014-11-20 DIAGNOSIS — Z808 Family history of malignant neoplasm of other organs or systems: Secondary | ICD-10-CM | POA: Diagnosis not present

## 2014-11-20 DIAGNOSIS — G4730 Sleep apnea, unspecified: Secondary | ICD-10-CM | POA: Diagnosis present

## 2014-11-20 DIAGNOSIS — I4891 Unspecified atrial fibrillation: Secondary | ICD-10-CM | POA: Diagnosis present

## 2014-11-20 DIAGNOSIS — I4819 Other persistent atrial fibrillation: Secondary | ICD-10-CM

## 2014-11-20 HISTORY — DX: Inflammatory liver disease, unspecified: K75.9

## 2014-11-20 HISTORY — DX: Unspecified osteoarthritis, unspecified site: M19.90

## 2014-11-20 LAB — BASIC METABOLIC PANEL
Anion gap: 5 (ref 5–15)
BUN: 23 mg/dL (ref 6–23)
BUN: 23 mg/dL — ABNORMAL HIGH (ref 6–20)
CHLORIDE: 103 mmol/L (ref 101–111)
CO2: 26 meq/L (ref 19–32)
CO2: 30 mmol/L (ref 22–32)
CREATININE: 1.24 mg/dL (ref 0.40–1.50)
CREATININE: 1.22 mg/dL (ref 0.61–1.24)
Calcium: 9.9 mg/dL (ref 8.4–10.5)
Calcium: 9 mg/dL (ref 8.9–10.3)
Chloride: 102 mEq/L (ref 96–112)
GFR calc Af Amer: >60 mL/min (ref >60)
GFR, EST NON AFRICAN AMERICAN: 57 mL/min — AB (ref >60)
GFR: 60.66 mL/min (ref >60.00)
GLUCOSE: 108 mg/dL — AB (ref 70–99)
GLUCOSE: 124 mg/dL — AB (ref 65–99)
Potassium: 3.8 mEq/L (ref 3.5–5.1)
Potassium: 4 mmol/L (ref 3.5–5.1)
SODIUM: 137 meq/L (ref 135–145)
Sodium: 138 mmol/L (ref 135–145)

## 2014-11-20 LAB — MAGNESIUM: Magnesium: 2.2 mg/dL (ref 1.5–2.5)

## 2014-11-20 MED ORDER — POTASSIUM CHLORIDE CRYS ER 20 MEQ PO TBCR
40.0000 meq | EXTENDED_RELEASE_TABLET | Freq: Every day | ORAL | Status: DC
  Administered 2014-11-20 – 2014-11-23 (×4): 40 meq via ORAL
  Filled 2014-11-20: qty 4
  Filled 2014-11-20 (×2): qty 2

## 2014-11-20 MED ORDER — APIXABAN 5 MG PO TABS
5.0000 mg | ORAL_TABLET | Freq: Two times a day (BID) | ORAL | Status: DC
  Administered 2014-11-20 – 2014-11-23 (×6): 5 mg via ORAL
  Filled 2014-11-20 (×6): qty 1

## 2014-11-20 MED ORDER — ACYCLOVIR 200 MG PO CAPS
400.0000 mg | ORAL_CAPSULE | Freq: Every day | ORAL | Status: DC
  Administered 2014-11-21 – 2014-11-23 (×3): 400 mg via ORAL
  Filled 2014-11-20 (×3): qty 2

## 2014-11-20 MED ORDER — OCUVITE-LUTEIN PO CAPS
1.0000 | ORAL_CAPSULE | Freq: Two times a day (BID) | ORAL | Status: DC
  Administered 2014-11-20 – 2014-11-23 (×6): 1 via ORAL
  Filled 2014-11-20 (×7): qty 1

## 2014-11-20 MED ORDER — METOPROLOL TARTRATE 50 MG PO TABS
50.0000 mg | ORAL_TABLET | Freq: Two times a day (BID) | ORAL | Status: DC
  Administered 2014-11-20 – 2014-11-23 (×6): 50 mg via ORAL
  Filled 2014-11-20 (×6): qty 1

## 2014-11-20 MED ORDER — POTASSIUM CHLORIDE CRYS ER 20 MEQ PO TBCR: 40.0000 meq | EXTENDED_RELEASE_TABLET | Freq: Every day | ORAL | Status: DC

## 2014-11-20 MED ORDER — PRAVASTATIN SODIUM 20 MG PO TABS
20.0000 mg | ORAL_TABLET | Freq: Every day | ORAL | Status: DC
  Administered 2014-11-20 – 2014-11-22 (×3): 20 mg via ORAL
  Filled 2014-11-20 (×3): qty 1

## 2014-11-20 MED ORDER — SODIUM CHLORIDE 0.9 % IV SOLN: 250.0000 mL | INTRAVENOUS | Status: DC | PRN

## 2014-11-20 MED ORDER — AMLODIPINE BESYLATE 5 MG PO TABS
5.0000 mg | ORAL_TABLET | Freq: Every day | ORAL | Status: DC
  Administered 2014-11-21 – 2014-11-23 (×3): 5 mg via ORAL
  Filled 2014-11-20 (×3): qty 1

## 2014-11-20 MED ORDER — DOFETILIDE 500 MCG PO CAPS
500.0000 ug | ORAL_CAPSULE | Freq: Two times a day (BID) | ORAL | Status: DC
  Administered 2014-11-20 – 2014-11-23 (×6): 500 ug via ORAL
  Filled 2014-11-20 (×6): qty 1

## 2014-11-20 MED ORDER — SODIUM CHLORIDE 0.9 % IJ SOLN
3.0000 mL | Freq: Two times a day (BID) | INTRAMUSCULAR | Status: DC
  Administered 2014-11-20 – 2014-11-22 (×5): 3 mL via INTRAVENOUS

## 2014-11-20 MED ORDER — SODIUM CHLORIDE 0.9 % IJ SOLN: 3.0000 mL | INTRAMUSCULAR | Status: DC | PRN

## 2014-11-20 NOTE — H&P (Signed)
Patient ID: MINH ROANHORSE MRN: [id number], DOB/AGE: 73/01/1941   Admit date: 11/20/2014   Primary Physician: Unice Cobble, MD Primary Cardiologist: Vaughan Browner, MD / D. Carrol, NP (afib clinic)  Pt. Profile:  74 y/o male dx with afib earlier this year who presents for tikosyn loading.  Problem List  Past Medical History  Diagnosis Date  . Tubular adenoma of colon 2006    Dr Fuller Plan  . Sleep apnea     S/P oral surgery   . Unspecified essential hypertension   . Fasting hyperglycemia 2006    FBS 111; normal A1c  . Hyperlipidemia   . Atrial fibrillation 08/24/2014    a. 08/2014 Echo: EF 55-60%, no rwma, mild MR, mod-sev dil LA, mod dil RA, PASP 79mmHg;  b. 09/2014 DCCV ->Return to afib after 3 days;  b. CHA2DS2VASc = 1-->Eliquis.    Past Surgical History  Procedure Laterality Date  . Uvulopalatopharyngoplasty  1997    Dr.Woliki  . Nasal sinus surgery    . Tonsillectomy  1997  . Colonoscopy w/ polypectomy  2006    [place]  . Colonoscopy  2011    no polyp  . Prostate biopsy  2010    Negative,Dr.Ottelin  . 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 Cardioversion N/A 10/12/2014    Procedure: CARDIOVERSION;  Surgeon: Darlin Coco, MD;  Location: [facility] ENDOSCOPY;  Service: Cardiovascular;  Laterality: N/A;     Allergies  No Known Allergies  HPI  DORION PETILLO is a 74 y.o. male with a h/o HTN, HLD, sleep apnea, treated in the 80's with oral surgery, new onset afib that was found on physical exam in May of this year. It was rate controlled at the time. He was started on apixaban 5 mg bid and ser up for DCCV. Unfortunately has early return of afib after 3 days. He is mildly symptomatic with fatigue. Metoprolol was recently increased to 50 mg bid for better rate control.  He was seen in afib clinic on 7/19 and after discussion with Dr. Rayann Heman, it was felt that he would benefit from tikosyn loading.  His only complaint when in afib is reduced energy/fatigue.  He  denies chest pain, palpitations, dyspnea, pnd, orthopnea, n, v, dizziness, syncope, edema, weight gain, or early satiety.   Home Medications  Prior to Admission medications   Medication Sig Start Date End Date Taking? Authorizing Provider  acetaminophen (TYLENOL) 500 MG chewable tablet Chew 500 mg by mouth every 6 (six) hours as needed for pain.    Historical Provider, MD  acyclovir (ZOVIRAX) 200 MG capsule Take 400 mg by mouth daily.    Historical Provider, MD  amLODipine (NORVASC) 5 MG tablet Take 5 mg by mouth daily.    Historical Provider, MD  apixaban (ELIQUIS) 5 MG TABS tablet Take 1 tablet (5 mg total) by mouth 2 (two) times daily. 10/26/14   Darlin Coco, MD  Ascorbic Acid (VITAMIN C) 1000 MG tablet Take 1,000 mg by mouth daily.      Historical Provider, MD  GLUCOSAMINE SULFATE PO Take 2 tablets by mouth daily.     Historical Provider, MD  metoprolol (LOPRESSOR) 50 MG tablet Take 1 tablet (50 mg total) by mouth 2 (two) times daily. 10/26/14   Darlin Coco, MD  Multiple Vitamin (MULTIVITAMIN) tablet Take 1 tablet by mouth daily.     Historical Provider, MD  Multiple Vitamins-Minerals (PRESERVISION AREDS 2 PO) Take 1 tablet by mouth 2 (  two) times daily.    Historical Provider, MD  potassium chloride SA (K-DUR,KLOR-CON) 20 MEQ tablet Take 1 tablet (20 mEq total) by mouth daily. 11/16/14   Darlin Coco, MD  pravastatin (PRAVACHOL) 20 MG tablet Take 20 mg by mouth daily.    Historical Provider, MD    Family History  Family History  Problem Relation Age of Onset  . Prostate cancer Father   . Hypertension Father   . Hypertension Mother   . Prostate cancer Brother   . Diabetes Neg Hx   . Heart disease Neg Hx   . Stroke Neg Hx   . Melanoma Sister   . Atrial fibrillation Brother   . Melanoma Brother   . Lupus Sister     Social History  History   Social History  . Marital Status: Married    Spouse Name: N/A  . Number of Children: N/A  . Years of Education: N/A    Occupational History  . Not on file.   Social History Main Topics  . Smoking status: Former Smoker    Quit date: 04/21/1968  . Smokeless tobacco: Not on file     Comment: smoked age [age], up to 1ppd  . Alcohol Use: Yes     Comment: Rarely  . Drug Use: No  . Sexual Activity: Not on file   Other Topics Concern  . Not on file   Social History Narrative     Review of Systems General:  Fatigue/low energy.  No chills, fever, night sweats or weight changes.  Cardiovascular:  No chest pain, dyspnea on exertion, edema, orthopnea, palpitations, paroxysmal nocturnal dyspnea. Dermatological: No rash, lesions/masses Respiratory: No cough, dyspnea Urologic: No hematuria, dysuria Abdominal:   No nausea, vomiting, diarrhea, bright red blood per rectum, melena, or hematemesis Neurologic:  No visual changes, wkns, changes in mental status. All other systems reviewed and are otherwise negative except as noted above.  Physical Exam  Blood pressure 126/83, pulse 66, temperature 98 F (36.7 C), temperature source Oral, resp. rate 18, weight 197 lb 3.2 oz (89.449 kg), SpO2 96 %.  General: Pleasant, NAD Psych: Normal affect. Neuro: Alert and oriented X 3. Moves all extremities spontaneously. HEENT: Normal  Neck: Supple without bruits or JVD. Lungs:  Resp regular and unlabored, CTA. Heart: IR, IR no s3, s4, or murmurs. Abdomen: Soft, non-tender, non-distended, BS + x 4.  Extremities: No clubbing, cyanosis or edema. DP/PT/Radials 2+ and equal bilaterally.  Labs   Lab Results  Component Value Date   WBC 5.8 10/09/2014   HGB 15.4 10/09/2014   HCT 45.9 10/09/2014   MCV 95.4 10/09/2014   PLT 203.0 10/09/2014     Recent Labs Lab 11/20/14 1010  NA 137  K 3.8  CL 102  CO2 26  BUN 23  CREATININE 1.24  CALCIUM 9.9  GLUCOSE 108*  Mg 2.2  Lab Results  Component Value Date   CHOL 162 08/24/2014   HDL 50 08/24/2014   LDLCALC 97 08/24/2014   TRIG 77 08/24/2014      Radiology/Studies  No results found.  ECG  Afib, 96, rbbb, lafb, QTc 463.4.   ASSESSMENT AND PLAN  1. Persistent Atrial Fibrillation:  S/p DCCV in June with return of afib w/in 3 days.  He does have fatigue and reduced exercise tolerance in afib and has thus been seen in afib clinic.  He presents today for tikosyn loading.  His QTc is 463.4 in the setting of RBBB, thus it is effectively 423.  Creat clearance by Cockcroft-Gault, using his current weight, is 67.1 ml/min, thus qualifying him for tikosyn 500 mcg q12h.  His K is slightly low @ 3.8, thus I will supplement now and repeat a bmet @ 20:00.  Mg is 2.2.  Provided that he tolerates tikosyn, he will receive his sixth dose on the morning of Thursday 8/4, and if he has not converted to sinus rhythm by that point, will require repeat DCCV.  Cont eliquis (CHA2DS2VASc =  1).  Signed, Murray Hodgkins, NP 11/20/2014, 3:42 PM Admitted for dofetilide loading Aware of the issue of proarrhythmia anticpate cardioverion Wed if not cardioverted QTc long but in the context of RBBB is within acceptable range

## 2014-11-20 NOTE — Progress Notes (Signed)
Pharmacy Review for Dofetilide (Tikosyn) Initiation  Admit Complaint: 74 y.o. male admitted 11/20/2014 with atrial fibrillation to be initiated on dofetilide.   Assessment:  Patient Exclusion Criteria: If any screening criteria checked as "Yes", then  patient  should NOT receive dofetilide until criteria item is corrected. If "Yes" please indicate correction plan.  YES  NO Patient  Exclusion Criteria Correction Plan  [x]  []  Baseline QTc interval is greater than or equal to 440 msec. IF above YES box checked dofetilide contraindicated unless patient has ICD; then may proceed if QTc 500-550 msec or with known ventricular conduction abnormalities may proceed with QTc 550-600 msec. QTc = 475 Patient has a LBBB, which increases the QTc. Per NP, in this case, the QTc is calculated as QTc on EKG - 40 (so initial is 435)  []  [x]  Magnesium level is less than 1.8 mEq/l : Last magnesium:  Lab Results  Component Value Date   MG 2.2 11/20/2014         [x]  []  Potassium level is less than 4 mEq/l : Last potassium:  Lab Results  Component Value Date   K 3.8 11/20/2014       40 meq PO now, repeat BMET at 2000 - was 4  []  [x]  Patient is known or suspected to have a digoxin level greater than 2 ng/ml: No results found for: DIGOXIN  Not on digoxin  []  [x]  Creatinine clearance less than 20 ml/min (calculated using Cockcroft-Gault, actual body weight and serum creatinine): Estimated Creatinine Clearance: 58.7 mL/min (by C-G formula based on Cr of 1.24).    []  [x]  Patient has received drugs known to prolong the QT intervals within the last 48 hours (phenothiazines, tricyclics or tetracyclic antidepressants, erythromycin, H-1 antihistamines, cisapride, fluoroquinolones, azithromycin). Drugs not listed above may have an, as yet, undetected potential to prolong the QT interval, updated information on QT prolonging agents is available at this website:QT prolonging agents   []  [x]  Patient received a dose of  hydrochlorothiazide (Oretic) alone or in any combination including triamterene (Dyazide, Maxzide) in the last 48 hours.   []  [x]  Patient received a medication known to increase dofetilide plasma concentrations prior to initial dofetilide dose:  . Trimethoprim (Primsol, Proloprim) in the last 36 hours . Verapamil (Calan, Verelan) in the last 36 hours or a sustained release dose in the last 72 hours . Megestrol (Megace) in the last 5 days  . Cimetidine (Tagamet) in the last 6 hours . Ketoconazole (Nizoral) in the last 24 hours . Itraconazole (Sporanox) in the last 48 hours  . Prochlorperazine (Compazine) in the last 36 hours    []  [x]  Patient is known to have a history of torsades de pointes; congenital or acquired long QT syndromes.   []  [x]  Patient has received a Class 1 antiarrhythmic with less than 2 half-lives since last dose. (Disopyramide, Quinidine, Procainamide, Lidocaine, Mexiletine, Flecainide, Propafenone)   []  [x]  Patient has received amiodarone therapy in the past 3 months or amiodarone level is greater than 0.3 ng/ml.    Patient has been appropriately anticoagulated with apixaban.  Ordering provider was confirmed at [email or web address] if they are not listed on the [place] Prescribers list.  Goal of Therapy: Follow renal function, electrolytes, potential drug interactions, and dose adjustment. Provide education and 1 week supply at discharge.  Plan:  [x]   Physician selected initial dose within range recommended for patients level of renal function - will monitor for response.  []   Physician selected initial dose outside of range  recommended for patients level of renal function - will discuss if the dose should be altered at this time.   Select One Calculated CrCl  Dose q12h  [x]  > 60 ml/min 500 mcg  []  40-60 ml/min 250 mcg  []  20-40 ml/min 125 mcg   2. Follow up QTc after the first 5 doses, renal function, electrolytes (K & Mg) daily x 3     days, dose  adjustment, success of initiation and facilitate 1 week discharge supply as     clinically indicated.  3. Initiate Tikosyn education video (Call [phone] and ask for video # 116).  4. Place Enrollment Form on the chart for discharge supply of dofetilide.  Levester Fresh, PharmD, BCPS Clinical Pharmacist Pager [phone] 11/20/2014 4:51 PM

## 2014-11-20 NOTE — Progress Notes (Signed)
Patient ID: Daniel Tucker, male   DOB: [birth year], 74 y.o.   MRN: [id number]      Primary Care Physician: Unice Cobble, MD Referring Physician:Dr. Jmarion Christiano is a 74 y.o. male with a h/o HTN, HLD, sleep apnea, treated in the 80's with oral surgery, new onset afib that was found on physical exam in May of this year. It was rate controlled at the time. He was started on apixaban 5 mg bid and ser up for DCCV. Unfortunately has early return of afib after 3 days. He was mildly symptomatic with fatigue.  Metoprolol was recently increased to 50 mg bid for better rate control. He was seen in the afib clinic in July to discuss rate control options going forward. He has a RBBB which excludes use of flecainide.  They discussed amiodarone versus Tikosyn and pt decided to pursue Tikosyn.   Pt is accompanied with his wife today.  They were educated on the potential side effects of Tikosyn including QTc prolongation.  He is aware of the importance of compliance and will call the office with any missed doses.  Medication list reviewed.  He is currently not taking any QTc prolongating or contraindicated medications.  He is anticoagulated with Eliquis and reports not missing any doses within the past 30 days.  He was started on potassium 56mEq daily last week to help ensure K>4.0 for hospitalization today.   EKG reviewed by Dr. Rayann Heman.  Afib with RBBB.  QTc 448 msec    Past Medical History  Diagnosis Date  . Tubular adenoma of colon 2006    Dr Fuller Plan  . Sleep apnea     S/P oral surgery   . Unspecified essential hypertension   . Fasting hyperglycemia 2006    FBS 111; normal A1c  . Hyperlipidemia   . Atrial fibrillation 08/24/2014   Past Surgical History  Procedure Laterality Date  . Uvulopalatopharyngoplasty  1997    Dr.Woliki  . Nasal sinus surgery    . Tonsillectomy  1997  . Colonoscopy w/ polypectomy  2006    [place]  . Colonoscopy  2011    no polyp  . Prostate biopsy  2010      Negative,Dr.Ottelin  . 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 Cardioversion N/A 10/12/2014    Procedure: CARDIOVERSION;  Surgeon: Darlin Coco, MD;  Location: [facility] ENDOSCOPY;  Service: Cardiovascular;  Laterality: N/A;    Current Outpatient Prescriptions  Medication Sig Dispense Refill  . acetaminophen (TYLENOL) 500 MG chewable tablet Chew 500 mg by mouth every 6 (six) hours as needed for pain.    Marland Kitchen acyclovir (ZOVIRAX) 200 MG capsule Take 400 mg by mouth daily.    Marland Kitchen amLODipine (NORVASC) 5 MG tablet Take 5 mg by mouth daily.    Marland Kitchen apixaban (ELIQUIS) 5 MG TABS tablet Take 1 tablet (5 mg total) by mouth 2 (two) times daily. 180 tablet 3  . Ascorbic Acid (VITAMIN C) 1000 MG tablet Take 1,000 mg by mouth daily.      Marland Kitchen GLUCOSAMINE SULFATE PO Take 2 tablets by mouth daily.     . metoprolol (LOPRESSOR) 50 MG tablet Take 1 tablet (50 mg total) by mouth 2 (two) times daily. 180 tablet 3  . Multiple Vitamin (MULTIVITAMIN) tablet Take 1 tablet by mouth daily.     . Multiple Vitamins-Minerals (PRESERVISION AREDS 2 PO) Take 1 tablet by mouth 2 (two) times daily.    . potassium  chloride SA (K-DUR,KLOR-CON) 20 MEQ tablet Take 1 tablet (20 mEq total) by mouth daily. 30 tablet 1  . pravastatin (PRAVACHOL) 20 MG tablet Take 20 mg by mouth daily.     No current facility-administered medications for this visit.    No Known Allergies Assessment and Plan: 1. 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 Rayann Heman.  QTc slightly above 425msec but in the setting of RBBB, okay to start Tikosyn.  Potassium was still low today at 3.8.  Discussed options with pt including admission today for IV K supplement or staying at home for oral supplement.  He would prefer to go ahead to hospital for admission.  There is a bed available and he will proceed to hospital at this time.

## 2014-11-21 DIAGNOSIS — I10 Essential (primary) hypertension: Secondary | ICD-10-CM

## 2014-11-21 LAB — MAGNESIUM: MAGNESIUM: 2.1 mg/dL (ref 1.7–2.4)

## 2014-11-21 LAB — BASIC METABOLIC PANEL
Anion gap: 8 (ref 5–15)
BUN: 22 mg/dL — AB (ref 6–20)
CO2: 28 mmol/L (ref 22–32)
Calcium: 8.8 mg/dL — ABNORMAL LOW (ref 8.9–10.3)
Chloride: 102 mmol/L (ref 101–111)
Creatinine, Ser: 1.19 mg/dL (ref 0.61–1.24)
GFR calc non Af Amer: 59 mL/min — ABNORMAL LOW (ref >60)
GLUCOSE: 97 mg/dL (ref 65–99)
Potassium: 3.9 mmol/L (ref 3.5–5.1)
SODIUM: 138 mmol/L (ref 135–145)

## 2014-11-21 MED ORDER — POTASSIUM CHLORIDE CRYS ER 20 MEQ PO TBCR
20.0000 meq | EXTENDED_RELEASE_TABLET | Freq: Once | ORAL | Status: AC
  Administered 2014-11-21: 20 meq via ORAL
  Filled 2014-11-21: qty 1

## 2014-11-21 NOTE — Care Management Note (Signed)
Case Management Note Marvetta Gibbons RN, BSN Unit 2W-Case Manager [phone] Covering 3W  Patient Details  Name: Daniel Tucker MRN: [id number] Date of Birth: [birth year]  Subjective/Objective:      Pt admitted with afib for Tikosyn loading              Action/Plan: PTA pt lived at home- independent- anticipate return home  Expected Discharge Date:                  Expected Discharge Plan:  Home/Self Care  In-House Referral:     Discharge planning Services  CM Consult, Medication Assistance  Post Acute Care Choice:    Choice offered to:     DME Arranged:    DME Agency:     HH Arranged:    [place] Agency:     Status of Service:  In process, will continue to follow  Medicare Important Message Given:    Date Medicare IM Given:    Medicare IM give by:    Date Additional Medicare IM Given:    Additional Medicare Important Message give by:     If discussed at [place] of Stay Meetings, dates discussed:    Additional Comments:  Referral received for Tikosyn - per benefits check- S/W SIER[facility] @ [employer] RX # [phone]   TIKOSYN 500 MCG BID  COVER- YES  CO-PAY- $ 238.25  TIER- 4 DRUG  PRIOR APPROVAL- NO  [facility]   Spoke with pt at bedside- to inform of copay- per pt he uses [employer] order for most drugs- [facility] [address] locally- call made to [employer] and they can order Tikosyn with script- (pt states that he will f/u with [employer] to see if Tikosyn is on the mail order formulary)- understands need to use [facility] on discharge- pt will need two scripts on discharge on for 7 day supply (no refills) to be fills by [facility] main pharmacy that he will d/c home with- and one for outside pharmacy with refills. NCM to follow for further assistance.   Dawayne Patricia, RN 11/21/2014, 3:13 PM

## 2014-11-21 NOTE — Progress Notes (Signed)
Utilization review completed. Katheleen Stella, RN, BSN. 

## 2014-11-21 NOTE — Progress Notes (Signed)
    SUBJECTIVE: The patient is doing well today.  At this time, he denies chest pain, shortness of breath, or any new concerns.  He is symptomatically improved in SR.   Admitted 11/20/14 for Tikosyn load, converted to SR after 1st dose.   CURRENT MEDICATIONS: . acyclovir  400 mg Oral Daily  . amLODipine  5 mg Oral Daily  . apixaban  5 mg Oral BID  . dofetilide  500 mcg Oral BID  . metoprolol  50 mg Oral BID  . multivitamin-lutein  1 capsule Oral BID  . potassium chloride SA  40 mEq Oral Daily  . pravastatin  20 mg Oral QHS  . sodium chloride  3 mL Intravenous Q12H      OBJECTIVE: Physical Exam: Filed Vitals:   11/20/14 1512 11/20/14 2044 11/21/14 0500  BP: 126/83 138/94 145/98  Pulse: 66 88 65  Temp: 98 F (36.7 C) 97.9 F (36.6 C) 97.6 F (36.4 C)  TempSrc: Oral    Resp: 18 17 18   Weight: 197 lb 3.2 oz (89.449 kg)  194 lb (87.998 kg)  SpO2: 96% 99% 99%    Intake/Output Summary (Last 24 hours) at 11/21/14 0936 Last data filed at 11/21/14 0815  Gross per 24 hour  Intake    240 ml  Output      0 ml  Net    240 ml    Telemetry reveals AF with conversion to sinus rhythm  GEN- The patient is well appearing, alert and oriented x 3 today.   Head- normocephalic, atraumatic Eyes-  Sclera clear, conjunctiva pink Ears- hearing intact Oropharynx- clear Neck- supple  Lungs- Clear to ausculation bilaterally, normal work of breathing Heart- Regular rate and rhythm, no murmurs, rubs or gallops  GI- soft, NT, ND, + BS Extremities- no clubbing, cyanosis, or edema Skin- no rash or lesion Psych- euthymic mood, full affect Neuro- strength and sensation are intact  LABS: Basic Metabolic Panel:  Recent Labs  11/20/14 1010 11/20/14 1938 11/21/14 0415  NA 137 138 138  K 3.8 4.0 3.9  CL 102 103 102  CO2 26 30 28   GLUCOSE 108* 124* 97  BUN 23 23* 22*  CREATININE 1.24 1.22 1.19  CALCIUM 9.9 9.0 8.8*  MG 2.2  --  2.1    ASSESSMENT AND PLAN:  Active Problems:   Atrial  fibrillation  1.  Persistent atrial fibrillation Admitted 11/20/14 for Tikosyn load - converted to SR after 1st dose Will add additional K supplementation today Keep K >3.9, Mg >1.8; QTc stable Continue Eliquis for CHADS2VASC of at least 2  2.  HTN Trend stable No changes today  Anticipate discharge on Thursday.  Follow up with AF clinic in 1 and 4 weeks and Dr Mare Ferrari in 3 months.  Chanetta Marshall, NP 11/21/2014 9:41 AM  I have seen, examined the patient, and reviewed the above assessment and plan.  On exam, RRR. Changes to above are made where necessary.  Anticipate tikosyn load with discharge on Thursday.  Co Sign: Thompson Grayer, MD 11/21/2014 10:58 AM

## 2014-11-22 LAB — BASIC METABOLIC PANEL
ANION GAP: 6 (ref 5–15)
BUN: 19 mg/dL (ref 6–20)
CHLORIDE: 104 mmol/L (ref 101–111)
CO2: 28 mmol/L (ref 22–32)
Calcium: 8.8 mg/dL — ABNORMAL LOW (ref 8.9–10.3)
Creatinine, Ser: 1.08 mg/dL (ref 0.61–1.24)
GFR calc non Af Amer: >60 mL/min (ref >60)
GLUCOSE: 92 mg/dL (ref 65–99)
Potassium: 3.5 mmol/L (ref 3.5–5.1)
Sodium: 138 mmol/L (ref 135–145)

## 2014-11-22 LAB — MAGNESIUM: Magnesium: 2.1 mg/dL (ref 1.7–2.4)

## 2014-11-22 MED ORDER — SPIRONOLACTONE 25 MG PO TABS
12.5000 mg | ORAL_TABLET | Freq: Every day | ORAL | Status: DC
  Administered 2014-11-22 – 2014-11-23 (×2): 12.5 mg via ORAL
  Filled 2014-11-22 (×2): qty 1

## 2014-11-22 MED ORDER — POTASSIUM CHLORIDE CRYS ER 20 MEQ PO TBCR
40.0000 meq | EXTENDED_RELEASE_TABLET | Freq: Once | ORAL | Status: AC
  Administered 2014-11-22: 40 meq via ORAL
  Filled 2014-11-22: qty 2

## 2014-11-22 MED ORDER — POTASSIUM CHLORIDE CRYS ER 20 MEQ PO TBCR
20.0000 meq | EXTENDED_RELEASE_TABLET | Freq: Once | ORAL | Status: AC
  Administered 2014-11-22: 20 meq via ORAL

## 2014-11-22 NOTE — Progress Notes (Signed)
Spoke with Dr. Caryl Comes about giving tikosyn this am. Stated ok to give after potassium supplemented and aldactone ordered. Carroll Kinds RN

## 2014-11-22 NOTE — Discharge Summary (Signed)
ELECTROPHYSIOLOGY PROCEDURE DISCHARGE SUMMARY    Patient ID: Daniel Tucker,  MRN: [id number], DOB/AGE: 1940/09/18 74 y.o.  Admit date: 11/20/2014 Discharge date: 11/23/2014  Primary Care Physician: Unice Cobble, MD Primary Cardiologist: Mare Ferrari  Primary Discharge Diagnosis:  1.  Persistent atrial fibrillation status post Tikosyn loading this admission  Secondary Discharge Diagnosis:  1.  Sleep apnea 2.  Hypertension 3.  Hyperlipidemia   No Known Allergies   Procedures This Admission:  1.  Tikosyn loading  Brief HPI: Daniel Tucker is a 74 y.o. male with a past medical history as noted above.  They were referred to the AF clinic in the outpatient setting for treatment options of atrial fibrillation.  Risks, benefits, and alternatives to Tikosyn were reviewed with the patient who wished to proceed.    Hospital Course:  The patient was admitted and Tikosyn was initiated.  Renal function and electrolytes were followed during the hospitalization.  Their QTc remained stable. They were monitored until discharge on telemetry which demonstrated sinus rhythm.  On the day of discharge, they were examined by Dr Rayann Heman who considered them stable for discharge to home.  Follow-up has been arranged with AF clinic in 1 week and 4 weeks and with Dr Mare Ferrari in 3 months.   Physical Exam: Filed Vitals:   11/22/14 0500 11/22/14 1515 11/22/14 2033 11/23/14 0500  BP: 129/83 145/78 146/84 139/83  Pulse: 61 62 61 64  Temp: 98.3 F (36.8 C) 98 F (36.7 C) 97.6 F (36.4 C) 97.5 F (36.4 C)  TempSrc: Oral Oral Oral Oral  Resp: 18 18 18 18   Height:      Weight: 194 lb 1.6 oz (88.043 kg)   193 lb 12.8 oz (87.907 kg)  SpO2: 99% 100% 100% 98%    GEN- The patient is well appearing, alert and oriented x 3 today.   HEENT: normocephalic, atraumatic; sclera clear, conjunctiva pink; hearing intact; oropharynx clear; neck supple  Lungs- Clear to ausculation bilaterally, normal work of  breathing.  No wheezes, rales, rhonchi Heart- Regular rate and rhythm  GI- soft, non-tender, non-distended, bowel sounds present  Extremities- no clubbing, cyanosis, or edema; DP/PT/radial pulses 2+ bilaterally MS- no significant deformity or atrophy Skin- warm and dry, no rash or lesion Psych- euthymic mood, full affect Neuro- strength and sensation are intact   Labs:   Lab Results  Component Value Date   WBC 5.8 10/09/2014   HGB 15.4 10/09/2014   HCT 45.9 10/09/2014   MCV 95.4 10/09/2014   PLT 203.0 10/09/2014     Recent Labs Lab 11/23/14 0320  NA 138  K 4.1  CL 102  CO2 29  BUN 15  CREATININE 1.03  CALCIUM 9.0  GLUCOSE 95     Discharge Medications:    Medication List    TAKE these medications        acetaminophen 500 MG chewable tablet  Commonly known as:  TYLENOL  Chew 500 mg by mouth every 6 (six) hours as needed for pain.     acyclovir 200 MG capsule  Commonly known as:  ZOVIRAX  Take 400 mg by mouth daily.     amLODipine 5 MG tablet  Commonly known as:  NORVASC  Take 5 mg by mouth daily.     apixaban 5 MG Tabs tablet  Commonly known as:  ELIQUIS  Take 1 tablet (5 mg total) by mouth 2 (two) times daily.     dofetilide 500 MCG capsule  Commonly known as:  TIKOSYN  Take 1 capsule (500 mcg total) by mouth 2 (two) times daily.     GLUCOSAMINE SULFATE PO  Take 2 tablets by mouth daily.     metoprolol 50 MG tablet  Commonly known as:  LOPRESSOR  Take 1 tablet (50 mg total) by mouth 2 (two) times daily.     multivitamin tablet  Take 1 tablet by mouth daily.     potassium chloride SA 20 MEQ tablet  Commonly known as:  K-DUR,KLOR-CON  Take 2 tablets (40 mEq total) by mouth daily.     pravastatin 20 MG tablet  Commonly known as:  PRAVACHOL  Take 20 mg by mouth at bedtime.     PRESERVISION AREDS 2 PO  Take 1 tablet by mouth 2 (two) times daily.     spironolactone 25 MG tablet  Commonly known as:  ALDACTONE  Take 0.5 tablets (12.5 mg  total) by mouth daily.     vitamin C 1000 MG tablet  Take 1,000 mg by mouth daily.        Disposition:  Discharge Instructions    Diet - low sodium heart healthy    Complete by:  As directed      Increase activity slowly    Complete by:  As directed           Follow-up Information    Follow up with [facility] On 11/30/2014.   Why:  at [employer] information:   [place] [phone]      Follow up with [facility] On 12/21/2014.   Why:  at 9:30AM   Contact information:   [place] [phone]      Follow up with Warren Danes, MD On 02/22/2015.   Specialty:  Cardiology   Why:  at 8:30AM   Contact information:   [address] [place] [address] [phone]       Duration of Discharge Encounter: Greater than 30 minutes including physician time.  Signed, Chanetta Marshall, NP 11/23/2014 8:40 AM     I have seen, examined the patient, and reviewed the above assessment and plan.  On exam, RRR. QT is stable. Changes to above are made where necessary.    Co Sign: Thompson Grayer, MD 11/23/2014 2:53 PM

## 2014-11-22 NOTE — Progress Notes (Signed)
    SUBJECTIVE: The patient is doing well today.  At this time, he denies chest pain, shortness of breath, or any new concerns.  He is symptomatically improved in SR.   Admitted 11/20/14 for Tikosyn load, converted to SR after 1st dose.   CURRENT MEDICATIONS: . acyclovir  400 mg Oral Daily  . amLODipine  5 mg Oral Daily  . apixaban  5 mg Oral BID  . dofetilide  500 mcg Oral BID  . metoprolol  50 mg Oral BID  . multivitamin-lutein  1 capsule Oral BID  . potassium chloride  20 mEq Oral Once  . potassium chloride SA  40 mEq Oral Daily  . pravastatin  20 mg Oral QHS  . sodium chloride  3 mL Intravenous Q12H  . spironolactone  12.5 mg Oral Daily      OBJECTIVE: Physical Exam: Filed Vitals:   11/21/14 0500 11/21/14 1515 11/21/14 2029 11/22/14 0500  BP: 145/98 136/78 139/82 129/83  Pulse: 65 60 63 61  Temp: 97.6 F (36.4 C) 97.7 F (36.5 C) 97.9 F (36.6 C) 98.3 F (36.8 C)  TempSrc:  Oral Oral Oral  Resp: 18 18 18 18   Height: 5\' 9"  (1.753 m)     Weight: 87.998 kg (194 lb)   88.043 kg (194 lb 1.6 oz)  SpO2: 99% 100% 100% 99%    Intake/Output Summary (Last 24 hours) at 11/22/14 0834 Last data filed at 11/21/14 1800  Gross per 24 hour  Intake    480 ml  Output      0 ml  Net    480 ml    Telemetry reveals sinus rhythm  GEN- The patient is well appearing, alert and oriented x 3 today.   Head- normocephalic, atraumatic Eyes-  Sclera clear, conjunctiva pink Ears- hearing intact Oropharynx- clear Neck- supple  Lungs- Clear to ausculation bilaterally, normal work of breathing Heart- Regular rate and rhythm, no murmurs, rubs or gallops  GI- soft, NT, ND, + BS Extremities- no clubbing, cyanosis, or edema Skin- no rash or lesion Psych- euthymic mood, full affect Neuro- strength and sensation are intact  LABS: Basic Metabolic Panel:  Recent Labs  11/21/14 0415 11/22/14 0345  NA 138 138  K 3.9 3.5  CL 102 104  CO2 28 28  GLUCOSE 97 92  BUN 22* 19  CREATININE  1.19 1.08  CALCIUM 8.8* 8.8*  MG 2.1 2.1    ASSESSMENT AND PLAN:  Active Problems:   Atrial fibrillation  1.  Persistent atrial fibrillation Admitted 11/20/14 for Tikosyn load - converted to SR after 1st dose Will add additional K supplementation today Keep K >3.9, Mg >1.8; QTc stable Continue Eliquis for CHADS2VASC of at least 2  2.  HTN Spironolactone added by Dr Caryl Comes Will need to follow BP closely  3. Hypokalemia replete  Anticipate discharge in am.  Follow up with AF clinic in 1 and 4 weeks and Dr Mare Ferrari in 3 months.  Jeneen Rinks Deray Dawes mD  11/22/2014 8:34 AM

## 2014-11-22 NOTE — Progress Notes (Signed)
Notified Almyra Deforest PA K 3.5 this am. Ordered kdur supplement. Carroll Kinds RN

## 2014-11-23 LAB — BASIC METABOLIC PANEL
ANION GAP: 7 (ref 5–15)
BUN: 15 mg/dL (ref 6–20)
CALCIUM: 9 mg/dL (ref 8.9–10.3)
CHLORIDE: 102 mmol/L (ref 101–111)
CO2: 29 mmol/L (ref 22–32)
CREATININE: 1.03 mg/dL (ref 0.61–1.24)
GLUCOSE: 95 mg/dL (ref 65–99)
Potassium: 4.1 mmol/L (ref 3.5–5.1)
Sodium: 138 mmol/L (ref 135–145)

## 2014-11-23 LAB — MAGNESIUM: MAGNESIUM: 2.1 mg/dL (ref 1.7–2.4)

## 2014-11-23 MED ORDER — DOFETILIDE 500 MCG PO CAPS: 500.0000 ug | ORAL_CAPSULE | Freq: Two times a day (BID) | ORAL | Status: DC

## 2014-11-23 MED ORDER — SPIRONOLACTONE 25 MG PO TABS: 12.5000 mg | ORAL_TABLET | Freq: Every day | ORAL | Status: DC

## 2014-11-23 MED ORDER — POTASSIUM CHLORIDE CRYS ER 20 MEQ PO TBCR: 40.0000 meq | EXTENDED_RELEASE_TABLET | Freq: Every day | ORAL | Status: DC

## 2014-11-23 NOTE — Care Management Important Message (Signed)
Important Message  Patient Details  Name: Daniel Tucker MRN: [id number] Date of Birth: Aug 11, 1940   Medicare Important Message Given:  Yes-second notification given    Pricilla Handler 11/23/2014, 12:41 PM

## 2014-11-23 NOTE — Care Management Note (Addendum)
Case Management Note Daniel Gibbons RN, BSN Unit 2W-Case Manager [phone] Covering 3W  Patient Details  Name: Daniel Tucker MRN: [id number] Date of Birth: 10/21/1940  Subjective/Objective:      Pt admitted with afib for Tikosyn loading              Action/Plan: PTA pt lived at home- independent- anticipate return home  Expected Discharge Date:                  Expected Discharge Plan:  Home/Self Care  In-House Referral:     Discharge planning Services  CM Consult, Medication Assistance  Post Acute Care Choice:    Choice offered to:     DME Arranged:    DME Agency:     HH Arranged:    [place] Agency:     Status of Service:  Completed, signed off  Medicare Important Message Given:    Date Medicare IM Given:    Medicare IM give by:    Date Additional Medicare IM Given:    Additional Medicare Important Message give by:     If discussed at [place] of Stay Meetings, dates discussed:    Additional Comments:  11/23/14- pt for d/c today- 7 day script sent to [facility] to fill- no further d/c needs.   Referral received for Tikosyn - per benefits check- S[facility]W [employer] @ [employer] RX # [phone]   TIKOSYN 500 MCG BID  COVER- YES  CO-PAY- $ 238.25  TIER- 4 DRUG  PRIOR APPROVAL- NO  [facility]   Spoke with pt at bedside- to inform of copay- per pt he uses [employer] order for most drugs- [facility] [address] locally- call made to [employer] and they can order Tikosyn with script- (pt states that he will f/u with [employer] to see if Tikosyn is on the mail order formulary)- understands need to use [facility] on discharge- pt will need two scripts on discharge on for 7 day supply (no refills) to be fills by [facility] pharmacy that he will d/c home with- and one for outside pharmacy with refills. NCM to follow for further assistance.   Dawayne Patricia, RN 11/23/2014, 11:21 AM

## 2014-11-24 ENCOUNTER — Telehealth: Payer: Self-pay | Admitting: *Deleted

## 2014-11-24 NOTE — Telephone Encounter (Signed)
Pt was on tcm list d/c 11/23/14 a-fib had Tykosin loading. Pt will f/u with afib clinic @ cardiology next week. Did not make tcm appt...Johny Chess

## 2014-11-28 ENCOUNTER — Ambulatory Visit (HOSPITAL_COMMUNITY)
Admission: RE | Admit: 2014-11-28 | Discharge: 2014-11-28 | Disposition: A | Discharge status: Home / Self Care | Payer: Medicare Other | Source: Ambulatory Visit | Attending: Nurse Practitioner | Admitting: Nurse Practitioner

## 2014-11-28 ENCOUNTER — Other Ambulatory Visit (HOSPITAL_COMMUNITY): Payer: Self-pay | Admitting: *Deleted

## 2014-11-28 ENCOUNTER — Encounter (HOSPITAL_COMMUNITY): Payer: Self-pay | Admitting: Nurse Practitioner

## 2014-11-28 VITALS — BP 128/84 | HR 56 | Ht 69.0 in | Wt 197.6 lb

## 2014-11-28 DIAGNOSIS — Z87891 Personal history of nicotine dependence: Secondary | ICD-10-CM | POA: Diagnosis not present

## 2014-11-28 DIAGNOSIS — I481 Persistent atrial fibrillation: Secondary | ICD-10-CM

## 2014-11-28 DIAGNOSIS — Z7902 Long term (current) use of antithrombotics/antiplatelets: Secondary | ICD-10-CM | POA: Diagnosis not present

## 2014-11-28 DIAGNOSIS — Z79899 Other long term (current) drug therapy: Secondary | ICD-10-CM | POA: Diagnosis not present

## 2014-11-28 DIAGNOSIS — I4819 Other persistent atrial fibrillation: Secondary | ICD-10-CM

## 2014-11-28 DIAGNOSIS — I10 Essential (primary) hypertension: Secondary | ICD-10-CM | POA: Diagnosis not present

## 2014-11-28 DIAGNOSIS — E785 Hyperlipidemia, unspecified: Secondary | ICD-10-CM | POA: Insufficient documentation

## 2014-11-28 DIAGNOSIS — I452 Bifascicular block: Secondary | ICD-10-CM | POA: Diagnosis not present

## 2014-11-28 DIAGNOSIS — Z8249 Family history of ischemic heart disease and other diseases of the circulatory system: Secondary | ICD-10-CM | POA: Insufficient documentation

## 2014-11-28 LAB — BASIC METABOLIC PANEL
Anion gap: 8 (ref 5–15)
BUN: 21 mg/dL — ABNORMAL HIGH (ref 6–20)
CHLORIDE: 101 mmol/L (ref 101–111)
CO2: 27 mmol/L (ref 22–32)
Calcium: 9.5 mg/dL (ref 8.9–10.3)
Creatinine, Ser: 1.33 mg/dL — ABNORMAL HIGH (ref 0.61–1.24)
GFR calc Af Amer: 60 mL/min — ABNORMAL LOW (ref >60)
GFR, EST NON AFRICAN AMERICAN: 51 mL/min — AB (ref >60)
GLUCOSE: 114 mg/dL — AB (ref 65–99)
Potassium: 4.5 mmol/L (ref 3.5–5.1)
Sodium: 136 mmol/L (ref 135–145)

## 2014-11-28 LAB — MAGNESIUM: MAGNESIUM: 2.3 mg/dL (ref 1.7–2.4)

## 2014-11-28 NOTE — Progress Notes (Signed)
Patient ID: Daniel Tucker, male   DOB: 1941/01/24, 74 y.o.   MRN: [id number]     Primary Care Physician: Daniel Cobble, MD Referring Physician: [facility] f/u   Daniel Tucker is a 74 y.o. male with a h/o persistent afib that admitted for tikosyn load 8/1 to 8/4. He converted after one dose of tikosyn. QTc remained stable. He has an appointment on Thursday for f/u Tucker but woke up this am in afib. He called clinic and was advised to take am tikosyn and come in today. He reports that he went back into SR within the hour. EKg confirms SR. QTc 397 ms. Otherwise, he has no complaints. He is taking tiksoyn on a consistent basis and is compliant with DOAC. Feels so much better in SR.  Today, he denies symptoms of palpitations, chest pain, shortness of breath, orthopnea, PND, lower extremity edema, dizziness, presyncope, syncope, or neurologic sequela. The patient is tolerating medications without difficulties and is otherwise without complaint today.   Past Medical History  Diagnosis Date  . Tubular adenoma of colon 2006    Dr Fuller Plan  . Unspecified essential hypertension   . Fasting hyperglycemia 2006    FBS 111; normal A1c  . Hyperlipidemia   . Atrial fibrillation 08/24/2014    a. 08/2014 Echo: EF 55-60%, no rwma, mild MR, mod-sev dil LA, mod dil RA, PASP 70mmHg;  b. 09/2014 DCCV ->Return to afib after 3 days;  b. CHA2DS2VASc = 1-->Eliquis.  . Sleep apnea     "had OR; thought it was gone; having another sleep study 12/2014" (11/20/2014)  . Hepatitis ~ 1955    "yellow juandice"  . Arthritis     "hips" (11/20/2014)   Past Surgical History  Procedure Laterality Date  . Uvulopalatopharyngoplasty  1997    Dr.Woliki  . Colonoscopy w/ polypectomy  2006    Daniel Tucker  . Colonoscopy  2011    no polyp  . Prostate biopsy  2010    Negative,Dr.Ottelin  . 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 Cardioversion N/A 10/12/2014    Procedure: CARDIOVERSION;  Surgeon: Daniel Coco, MD;   Location: [facility] ENDOSCOPY;  Service: Cardiovascular;  Laterality: N/A;  . Tonsillectomy and adenoidectomy  1997  . Nasal sinus surgery  1997  . Joint replacement      Current Outpatient Prescriptions  Medication Sig Dispense Refill  . acetaminophen (TYLENOL) 500 MG chewable tablet Chew 500 mg by mouth every 6 (six) hours as needed for pain.    Marland Kitchen acyclovir (ZOVIRAX) 200 MG capsule Take 400 mg by mouth daily.    Marland Kitchen amLODipine (NORVASC) 5 MG tablet Take 5 mg by mouth daily.    Marland Kitchen apixaban (ELIQUIS) 5 MG TABS tablet Take 1 tablet (5 mg total) by mouth 2 (two) times daily. 180 tablet 3  . Ascorbic Acid (VITAMIN C) 1000 MG tablet Take 1,000 mg by mouth daily.      Marland Kitchen dofetilide (TIKOSYN) 500 MCG capsule Take 1 capsule (500 mcg total) by mouth 2 (two) times daily.    Marland Kitchen GLUCOSAMINE SULFATE PO Take 2 tablets by mouth daily.     . metoprolol (LOPRESSOR) 50 MG tablet Take 1 tablet (50 mg total) by mouth 2 (two) times daily. 180 tablet 3  . Multiple Vitamin (MULTIVITAMIN) tablet Take 1 tablet by mouth daily.     . Multiple Vitamins-Minerals (PRESERVISION AREDS 2 PO) Take 1 tablet by mouth 2 (two) times daily.    . potassium chloride SA (  K-DUR,KLOR-CON) 20 MEQ tablet Take 2 tablets (40 mEq total) by mouth daily. 30 tablet 1  . pravastatin (PRAVACHOL) 20 MG tablet Take 20 mg by mouth at bedtime.     Marland Kitchen spironolactone (ALDACTONE) 25 MG tablet Take 0.5 tablets (12.5 mg total) by mouth daily. 30 tablet 1   No current facility-administered medications for this encounter.    No Known Allergies  History   Social History  . Marital Status: Married    Spouse Name: N/A  . Number of Children: N/A  . Years of Education: N/A   Occupational History  . Not on file.   Social History Main Topics  . Smoking status: Former Smoker -- 1.00 packs/day for 7 years    Types: Cigarettes  . Smokeless tobacco: Never Used     Comment: smoked cigarettes age [age], up to 1ppd  . Alcohol Use: 1.2 oz/week    1 Glasses of  wine, 1 Cans of beer per week  . Drug Use: No  . Sexual Activity: Yes   Other Topics Concern  . Not on file   Social History Narrative    Family History  Problem Relation Age of Onset  . Prostate cancer Father   . Hypertension Father   . Hypertension Mother   . Prostate cancer Brother   . Diabetes Neg Hx   . Heart disease Neg Hx   . Stroke Neg Hx   . Melanoma Sister   . Atrial fibrillation Brother   . Melanoma Brother   . Lupus Sister     ROS- All systems are reviewed and negative except as per the HPI above  Physical Exam: Filed Vitals:   11/28/14 1425  BP: 128/84  Pulse: 56  Height: 5\' 9"  (1.753 m)  Weight: 197 lb 9.6 oz (89.631 kg)    GEN- The patient is well appearing, alert and oriented x 3 today.   Head- normocephalic, atraumatic Eyes-  Sclera clear, conjunctiva pink Ears- hearing intact Oropharynx- clear Neck- supple, no JVP Lymph- no cervical lymphadenopathy Lungs- Clear to ausculation bilaterally, normal work of breathing Heart- Regular rate and rhythm, no murmurs, rubs or gallops, PMI not laterally displaced GI- soft, NT, ND, + BS Extremities- no clubbing, cyanosis, or edema MS- no significant deformity or atrophy Skin- no rash or lesion Psych- euthymic mood, full affect Neuro- strength and sensation are intact  EKG-Sinus bradycardia at 56 bpm, RBBB, LAFB, minimal voltage for LVH, T wave abnormality, PR int 162 ms, QRS 128 ms, QTc 397 ms.  Assessment and Plan:  1. Persistent afib Maintaining SR but for a brief episode of afib this am. Feels much improved in SR Bmet, mag today  2. HTN Stable  F/u in afib clinic as scheduled 12/21/14.

## 2014-11-30 ENCOUNTER — Encounter (HOSPITAL_COMMUNITY): Payer: Medicare Other | Admitting: Nurse Practitioner

## 2014-12-01 ENCOUNTER — Ambulatory Visit (HOSPITAL_BASED_OUTPATIENT_CLINIC_OR_DEPARTMENT_OTHER): Payer: Medicare Other | Attending: Cardiology

## 2014-12-01 VITALS — Ht 69.0 in | Wt 195.0 lb

## 2014-12-01 DIAGNOSIS — G4730 Sleep apnea, unspecified: Secondary | ICD-10-CM | POA: Diagnosis present

## 2014-12-01 DIAGNOSIS — R0683 Snoring: Secondary | ICD-10-CM | POA: Diagnosis not present

## 2014-12-01 DIAGNOSIS — G4733 Obstructive sleep apnea (adult) (pediatric): Secondary | ICD-10-CM | POA: Diagnosis not present

## 2014-12-01 DIAGNOSIS — Z8669 Personal history of other diseases of the nervous system and sense organs: Secondary | ICD-10-CM

## 2014-12-01 DIAGNOSIS — I493 Ventricular premature depolarization: Secondary | ICD-10-CM | POA: Insufficient documentation

## 2014-12-01 DIAGNOSIS — I4819 Other persistent atrial fibrillation: Secondary | ICD-10-CM

## 2014-12-03 ENCOUNTER — Telehealth: Payer: Self-pay | Admitting: Cardiology

## 2014-12-03 NOTE — Sleep Study (Signed)
SLEEP STUDY    Patient Name: Daniel Tucker, Daniel Tucker Date: 12/01/2014 MRN: [id number] Gender: Male D.O.B: [birth year] Age (years): 75 Referring Provider: Darlin Coco RPSGT: Rema Jasmine Interpreting Physician: Fransico Him MD, ABSM RPSGT: Jonna Coup  Weight (lbs): 195 Height (inches): 69 BMI: 29 Neck Size: 16.00  CLINICAL INFORMATION  Sleep Study Type: NPSG  Indication for sleep study: Witnessed Apnea  SLEEP STUDY TECHNIQUE As per the AASM Manual for the Scoring of Sleep and Associated Events v2.3 (April 2016) with a hypopnea requiring 4% desaturations.  The channels recorded and monitored were frontal, central and occipital EEG, electrooculogram (EOG), submentalis EMG (chin), nasal and oral airflow, thoracic and abdominal wall motion, anterior tibialis EMG, snore microphone, electrocardiogram, and pulse oximetry.  MEDICATIONS Patient's medications include: Zovirax, Amlodipine, Eliquis, Tikosyn, Lopressor, KCl, Pravachol. Medications self-administered by patient during sleep study : No sleep medicine administered.  SLEEP ARCHITECTURE The study was initiated at 10:53:49 PM and ended at 5:03:34 AM.  Sleep onset time was 5.7 minutes and the sleep efficiency was reduced at 68.8%. The total sleep time was 254.5 minutes.  Stage REM latency was normal at 102.5 minutes.  The patient spent 4.91% of the night in stage N1 sleep, 81.73% in stage N2 sleep, 7.27% in stage N3 and 6.09% in REM.  Alpha intrusion was absent.  Supine sleep was 8.05%.  RESPIRATORY PARAMETERS The overall apnea/hypopnea index (AHI) was 12.3 per hour. There were 38 total apneas, including 37 obstructive, 1 central and 0 mixed apneas. There were 14 hypopneas and 2 RERAs.  The AHI during Stage REM sleep was 3.9 per hour.  AHI while supine was 23.4 per hour.  The mean oxygen saturation was 94.29%. The minimum SpO2 during sleep was 86.00%.  Soft snoring was noted during this  study.  CARDIAC DATA The 2 lead EKG demonstrated sinus rhythm. The mean heart rate was 54.22 beats per minute. Other EKG findings include: PVCs and a short run of nonsustained atrial tachcyardia.  LEG MOVEMENT DATA The total PLMS were 265 with a resulting PLMS index of 62.46. Associated arousal with leg movement index was 3.3 .  IMPRESSIONS Mild obstructive sleep apnea occurred during this study (AHI = 12.3/h). No significant central sleep apnea occurred during this study (CAI = 0.2/h). Mild oxygen desaturation was noted during this study (Min O2 = 86.00%). The patient snored with Soft snoring volume. EKG findings include PVCs and nonsustained atrial tahcyardia. Severe periodic limb movements of sleep occurred during the study. No significant associated arousals.  DIAGNOSIS Obstructive Sleep Apnea (327.23 [G47.33 ICD-10])  RECOMMENDATIONS Treatment options include therapeutic CPAP titration to determine optimal pressure required to alleviate sleep disordered breathing, referral to ENT for evaluation of surgical causes of sleep apnea or referral to dentistry for oral device. Positional therapy avoiding supine position during sleep. Avoid alcohol, sedatives and other CNS depressants that may worsen sleep apnea and disrupt normal sleep architecture. Sleep hygiene should be reviewed to assess factors that may improve sleep quality. Weight management and regular exercise should be initiated or continued if appropriate.

## 2014-12-03 NOTE — Telephone Encounter (Signed)
Please let patient know that they have mild sleep apnea.   Please set up OV at next available to discuss treatment options.

## 2014-12-04 NOTE — Telephone Encounter (Signed)
Informed patient of results - stated verbal understanding.   Appointment made for 01/30/15 10:45am

## 2014-12-05 ENCOUNTER — Encounter (HOSPITAL_COMMUNITY): Payer: Self-pay | Admitting: Nurse Practitioner

## 2014-12-05 ENCOUNTER — Ambulatory Visit (HOSPITAL_COMMUNITY)
Admission: RE | Admit: 2014-12-05 | Discharge: 2014-12-05 | Disposition: A | Discharge status: Home / Self Care | Payer: Medicare Other | Source: Ambulatory Visit | Attending: Nurse Practitioner | Admitting: Nurse Practitioner

## 2014-12-05 DIAGNOSIS — I481 Persistent atrial fibrillation: Secondary | ICD-10-CM | POA: Insufficient documentation

## 2014-12-05 DIAGNOSIS — I480 Paroxysmal atrial fibrillation: Secondary | ICD-10-CM | POA: Diagnosis not present

## 2014-12-05 LAB — BASIC METABOLIC PANEL
Anion gap: 5 (ref 5–15)
BUN: 19 mg/dL (ref 6–20)
CHLORIDE: 103 mmol/L (ref 101–111)
CO2: 30 mmol/L (ref 22–32)
Calcium: 9.2 mg/dL (ref 8.9–10.3)
Creatinine, Ser: 1.19 mg/dL (ref 0.61–1.24)
GFR, EST NON AFRICAN AMERICAN: 59 mL/min — AB (ref >60)
Glucose, Bld: 165 mg/dL — ABNORMAL HIGH (ref 65–99)
POTASSIUM: 4.6 mmol/L (ref 3.5–5.1)
SODIUM: 138 mmol/L (ref 135–145)

## 2014-12-07 ENCOUNTER — Encounter: Payer: Self-pay | Admitting: Gastroenterology

## 2014-12-18 ENCOUNTER — Ambulatory Visit: Payer: Medicare Other | Admitting: Internal Medicine

## 2014-12-21 ENCOUNTER — Encounter (HOSPITAL_COMMUNITY): Payer: Self-pay | Admitting: Nurse Practitioner

## 2014-12-21 ENCOUNTER — Ambulatory Visit (HOSPITAL_COMMUNITY)
Admit: 2014-12-21 | Discharge: 2014-12-21 | Disposition: A | Discharge status: Home / Self Care | Payer: Medicare Other | Source: Ambulatory Visit | Attending: Nurse Practitioner | Admitting: Nurse Practitioner

## 2014-12-21 ENCOUNTER — Other Ambulatory Visit: Payer: Self-pay

## 2014-12-21 VITALS — BP 124/90 | HR 58 | Ht 69.0 in | Wt 197.6 lb

## 2014-12-21 DIAGNOSIS — I481 Persistent atrial fibrillation: Secondary | ICD-10-CM

## 2014-12-21 DIAGNOSIS — I4819 Other persistent atrial fibrillation: Secondary | ICD-10-CM

## 2014-12-21 DIAGNOSIS — I10 Essential (primary) hypertension: Secondary | ICD-10-CM | POA: Insufficient documentation

## 2014-12-21 MED ORDER — POTASSIUM CHLORIDE CRYS ER 20 MEQ PO TBCR: 40.0000 meq | EXTENDED_RELEASE_TABLET | Freq: Every day | ORAL | Status: DC

## 2014-12-21 NOTE — Progress Notes (Signed)
Patient ID: Daniel Tucker, male   DOB: 07/13/1940, 74 y.o.   MRN: [id number]     Primary Care Physician: Unice Cobble, MD Cardiologist: Dr. Jovita Kussmaul is a 74 y.o. male with a h/o persistent afib that admitted for tikosyn load 8/1 to 8/4. He converted after one dose of tikosyn. QTc remained stable. He reports no symptoms of afib. Ekg today confirms SR. QTc 429  ms. Otherwise, he has no complaints. He is taking tiksoyn on a consistent basis and is compliant with DOAC. Feels so much better in SR.  Today, he denies symptoms of palpitations, chest pain, shortness of breath, orthopnea, PND, lower extremity edema, dizziness, presyncope, syncope, or neurologic sequela. The patient is tolerating medications without difficulties and is otherwise without complaint today.   Past Medical History  Diagnosis Date  . Tubular adenoma of colon 2006    Dr Fuller Plan  . Unspecified essential hypertension   . Fasting hyperglycemia 2006    FBS 111; normal A1c  . Hyperlipidemia   . Atrial fibrillation 08/24/2014    a. 08/2014 Echo: EF 55-60%, no rwma, mild MR, mod-sev dil LA, mod dil RA, PASP 48mHg;  b. 09/2014 DCCV ->Return to afib after 3 days;  b. CHA2DS2VASc = 1-->Eliquis.  . Sleep apnea     "had OR; thought it was gone; having another sleep study 12/2014" (11/20/2014)  . Hepatitis ~ 1955    "yellow juandice"  . Arthritis     "hips" (11/20/2014)   Past Surgical History  Procedure Laterality Date  . Uvulopalatopharyngoplasty  1997    Dr.Woliki  . Colonoscopy w/ polypectomy  2006    [place]  . Colonoscopy  2011    no polyp  . Prostate biopsy  2010    Negative,Dr.Ottelin  . 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 Cardioversion N/A 10/12/2014    Procedure: CARDIOVERSION;  Surgeon: TDarlin Coco MD;  [facility];  Service: Cardiovascular;  Laterality: N/A;  . Tonsillectomy and adenoidectomy  1997  . Nasal sinus surgery  1997  . Joint replacement       Current Outpatient Prescriptions  Medication Sig Dispense Refill  . acetaminophen (TYLENOL) 500 MG chewable tablet Chew 500 mg by mouth every 6 (six) hours as needed for pain.    .Marland Kitchenacyclovir (ZOVIRAX) 200 MG capsule Take 400 mg by mouth daily.    .Marland KitchenamLODipine (NORVASC) 5 MG tablet Take 5 mg by mouth daily.    .Marland Kitchenapixaban (ELIQUIS) 5 MG TABS tablet Take 1 tablet (5 mg total) by mouth 2 (two) times daily. 180 tablet 3  . Ascorbic Acid (VITAMIN C) 1000 MG tablet Take 1,000 mg by mouth daily.      .Marland Kitchendofetilide (TIKOSYN) 500 MCG capsule Take 1 capsule (500 mcg total) by mouth 2 (two) times daily.    .Marland KitchenGLUCOSAMINE SULFATE PO Take 2 tablets by mouth daily.     . metoprolol (LOPRESSOR) 50 MG tablet Take 1 tablet (50 mg total) by mouth 2 (two) times daily. 180 tablet 3  . Multiple Vitamin (MULTIVITAMIN) tablet Take 1 tablet by mouth daily.     . Multiple Vitamins-Minerals (PRESERVISION AREDS 2 PO) Take 1 tablet by mouth 2 (two) times daily.    . potassium chloride SA (K-DUR,KLOR-CON) 20 MEQ tablet Take 2 tablets (40 mEq total) by mouth daily. 180 tablet 3  . pravastatin (PRAVACHOL) 20 MG tablet Take 20 mg by mouth at bedtime.  No current facility-administered medications for this encounter.    No Known Allergies  Social History   Social History  . Marital Status: Married    Spouse Name: N/A  . Number of Children: N/A  . Years of Education: N/A   Occupational History  . Not on file.   Social History Main Topics  . Smoking status: Former Smoker -- 1.00 packs/day for 7 years    Types: Cigarettes  . Smokeless tobacco: Never Used     Comment: smoked cigarettes age [age], up to 1ppd  . Alcohol Use: 1.2 oz/week    1 Glasses of wine, 1 Cans of beer per week  . Drug Use: No  . Sexual Activity: Yes   Other Topics Concern  . Not on file   Social History Narrative    Family History  Problem Relation Age of Onset  . Prostate cancer Father   . Hypertension Father   . Hypertension  Mother   . Prostate cancer Brother   . Diabetes Neg Hx   . Heart disease Neg Hx   . Stroke Neg Hx   . Melanoma Sister   . Atrial fibrillation Brother   . Melanoma Brother   . Lupus Sister     ROS- All systems are reviewed and negative except as per the HPI above  Physical Exam: Filed Vitals:   12/21/14 0943  BP: 124/90  Pulse: 58  Height: _0  (1.753 m)  Weight: 197 lb 9.6 oz (89.631 kg)    GEN- The patient is well appearing, alert and oriented x 3 today.   Head- normocephalic, atraumatic Eyes-  Sclera clear, conjunctiva pink Ears- hearing intact Oropharynx- clear Neck- supple, no JVP Lymph- no cervical lymphadenopathy Lungs- Clear to ausculation bilaterally, normal work of breathing Heart- Regular rate and rhythm, no murmurs, rubs or gallops, PMI not laterally displaced GI- soft, NT, ND, + BS Extremities- no clubbing, cyanosis, or edema MS- no significant deformity or atrophy Skin- no rash or lesion Psych- euthymic mood, full affect Neuro- strength and sensation are intact  EKG-Sinus brady at 58 bpm, LAD, RBBB Pr 156 ms, QRS 134 ms, QTc 429 ms.  B met 8/16, Kt 4.6 ms, Creat 1.19, Mag 8/9, 2.3  Assessment and Plan:  1. Persistent afib Maintaining SR  Feels much improved in SR Continue tikosyn 500 mg bid  2. HTN Stable  F/u Dr. Mare Ferrari as scheduled 11/3, f/u q 3 months with bmet/mag/ekg with either Dr. Mare Ferrari or afib clinic.  Geroge Baseman Shawniece Oyola, [facility] [address] [place], [place] [address] [phone]

## 2015-01-18 ENCOUNTER — Ambulatory Visit (HOSPITAL_BASED_OUTPATIENT_CLINIC_OR_DEPARTMENT_OTHER): Payer: Medicare Other

## 2015-01-30 ENCOUNTER — Ambulatory Visit (INDEPENDENT_AMBULATORY_CARE_PROVIDER_SITE_OTHER): Payer: Medicare Other | Admitting: Cardiology

## 2015-01-30 ENCOUNTER — Encounter: Payer: Self-pay | Admitting: Cardiology

## 2015-01-30 VITALS — BP 142/86 | HR 52 | Ht 69.0 in | Wt 200.1 lb

## 2015-01-30 DIAGNOSIS — I10 Essential (primary) hypertension: Secondary | ICD-10-CM

## 2015-01-30 DIAGNOSIS — G4733 Obstructive sleep apnea (adult) (pediatric): Secondary | ICD-10-CM | POA: Diagnosis not present

## 2015-01-30 DIAGNOSIS — I481 Persistent atrial fibrillation: Secondary | ICD-10-CM

## 2015-01-30 DIAGNOSIS — I4819 Other persistent atrial fibrillation: Secondary | ICD-10-CM

## 2015-01-30 NOTE — Progress Notes (Signed)
Cardiology Office Note   Date:  01/30/2015   ID:  [place], [place] [birth year], MRN [id number]  PCP:  Unice Cobble, MD    Chief Complaint  Patient presents with  . Sleep Apnea      History of Present Illness: Daniel Tucker is a 74 y.o. male who presents for evaluation of OSA.  He has a history of atrial fibrillation and recently underwent a sleep study which showed mild OSA with an AHI of 12.3/hr and oxygen desaturations to 86% with soft snoring.  He was noted to have nonsustained atrial tachycardia and PVC's during the study.  Most events occurred during supine sleep.  He now presents to discuss treatment options of OSA.  He says that he snores at night but not very much.  He has a history of severe snoring years ago which improved with tonsillectomy and uvuloplasty.  He says that he never wakes up gasping for breath or snoring.  He says that his wife occasionally will notice him stop breathing in his sleep.  He wakes up feeling tired in the am but has pain at night due to bone spurs.  He used to sleep a lot during the day but since treatment of PAF his daytime sleepiness has improved.      Past Medical History  Diagnosis Date  . Tubular adenoma of colon 2006    Dr Fuller Plan  . Unspecified essential hypertension   . Fasting hyperglycemia 2006    FBS 111; normal A1c  . Hyperlipidemia   . Atrial fibrillation ([place]) 08/24/2014    a. 08/2014 Echo: EF 55-60%, no rwma, mild MR, mod-sev dil LA, mod dil RA, PASP 7mmHg;  b. 09/2014 DCCV ->Return to afib after 3 days;  b. CHA2DS2VASc = 1-->Eliquis.  . Sleep apnea     "had OR; thought it was gone; having another sleep study 12/2014" (11/20/2014)  . Hepatitis ~ 1955    "yellow juandice"  . Arthritis     "hips" (11/20/2014)    Past Surgical History  Procedure Laterality Date  . Uvulopalatopharyngoplasty  1997    Dr.Woliki  . Colonoscopy w/ polypectomy  2006    [place]  . Colonoscopy  2011    no polyp  . Prostate  biopsy  2010    Negative,Dr.Ottelin  . 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 Cardioversion N/A 10/12/2014    Procedure: CARDIOVERSION;  Surgeon: Darlin Coco, MD;  Location: [facility];  Service: Cardiovascular;  Laterality: N/A;  . Tonsillectomy and adenoidectomy  1997  . Nasal sinus surgery  1997  . Joint replacement       Current Outpatient Prescriptions  Medication Sig Dispense Refill  . acetaminophen (TYLENOL) 500 MG chewable tablet Chew 500 mg by mouth every 6 (six) hours as needed for pain.    Marland Kitchen acyclovir (ZOVIRAX) 200 MG capsule Take 400 mg by mouth daily.    Marland Kitchen amLODipine (NORVASC) 5 MG tablet Take 5 mg by mouth daily.    Marland Kitchen apixaban (ELIQUIS) 5 MG TABS tablet Take 1 tablet (5 mg total) by mouth 2 (two) times daily. 180 tablet 3  . Ascorbic Acid (VITAMIN C) 1000 MG tablet Take 1,000 mg by mouth daily.      Marland Kitchen dofetilide (TIKOSYN) 500 MCG capsule Take 1 capsule (500 mcg total) by mouth 2 (two) times daily.    . metoprolol (LOPRESSOR) 50 MG  tablet Take 1 tablet (50 mg total) by mouth 2 (two) times daily. 180 tablet 3  . Multiple Vitamin (MULTIVITAMIN) tablet Take 1 tablet by mouth daily.     . Multiple Vitamins-Minerals (PRESERVISION AREDS 2 PO) Take 1 tablet by mouth 2 (two) times daily.    . potassium chloride SA (K-DUR,KLOR-CON) 20 MEQ tablet Take 2 tablets (40 mEq total) by mouth daily. 180 tablet 3  . pravastatin (PRAVACHOL) 20 MG tablet Take 20 mg by mouth at bedtime.     Marland Kitchen GLUCOSAMINE SULFATE PO Take 2 tablets by mouth daily. Will start back med in January 2017     No current facility-administered medications for this visit.    Allergies:   Review of patient's allergies indicates no known allergies.    Social History:  The patient  reports that he has quit smoking. His smoking use included Cigarettes. He has a 7 pack-year smoking history. He has never used smokeless tobacco. He reports that he drinks about 1.2 oz of alcohol per week. He reports that he  does not use illicit drugs.   Family History:  The patient's family history includes Atrial fibrillation in his brother; Hypertension in his father and mother; Lupus in his sister; Melanoma in his brother and sister; Prostate cancer in his brother and father. There is no history of Diabetes, Heart disease, or Stroke.    ROS:  Please see the history of present illness.   Otherwise, review of systems are positive for none.   All other systems are reviewed and negative.    PHYSICAL EXAM: VS:  BP 142/86 mmHg  Pulse 52  Ht 5\' 9"  (1.753 m)  Wt 200 lb 1.9 oz (90.774 kg)  BMI 29.54 kg/m2  SpO2 98% , BMI Body mass index is 29.54 kg/(m^2). GEN: Well nourished, well developed, in no acute distress HEENT: normal Neck: no JVD, carotid bruits, or masses Cardiac: RRR; no murmurs, rubs, or gallops,no edema  Respiratory:  clear to auscultation bilaterally, normal work of breathing GI: soft, nontender, nondistended, + BS MS: no deformity or atrophy Skin: warm and dry, no rash Neuro:  Strength and sensation are intact Psych: euthymic mood, full affect   EKG:  EKG is not ordered today.    Recent Labs: 08/24/2014: ALT 16; TSH 1.21 10/09/2014: Hemoglobin 15.4; Platelets 203.0 11/28/2014: Magnesium 2.3 12/05/2014: BUN 19; Creatinine, Ser 1.19; Potassium 4.6; Sodium 138    Lipid Panel    Component Value Date/Time   CHOL 162 08/24/2014 1042   CHOL 155 08/17/2013 1126   TRIG 77 08/24/2014 1042   TRIG 91.0 08/17/2013 1126   HDL 50 08/24/2014 1042   HDL 45.80 08/17/2013 1126   CHOLHDL 3 08/17/2013 1126   VLDL 18.2 08/17/2013 1126   LDLCALC 97 08/24/2014 1042   LDLCALC 91 08/17/2013 1126      Wt Readings from Last 3 Encounters:  01/30/15 200 lb 1.9 oz (90.774 kg)  12/21/14 197 lb 9.6 oz (89.631 kg)  12/01/14 195 lb (88.451 kg)        ASSESSMENT AND PLAN:  1.  Mild OSA with an AHI of 12/hr mainly in the supine position.  Oxygen desaturations occurred as low as 86%.  He is not very  symptomatic with his OSA which is mild in severity with minimal oxygen desaturations and no desaturations <88% for > 5 minutes.  His symptoms of snoring and daytime sleepiness has significantly improved with tonsillectomy and uvuloplasty as well as getting back in NSR.  We discussed the  treatment options of CPAP vs. Referral to dentistry to determine if he is a candidate for oral device.  Since his OSA is very mild with no significant daytime sleepiness or desaturations I have recommended that we refer him to dentistry for evaluation of oral device.  If he is not a candidate then will proceed with CPAP therapy.  He is in agreement with this plan. 2.  HTN - cnotrolled on amlodipine 3.  PAF - maintaining NSR on TIkosyn   Current medicines are reviewed at length with the patient today.  The patient does not have concerns regarding medicines.  The following changes have been made:  no change  Labs/ tests ordered today: See above Assessment and Plan No orders of the defined types were placed in this encounter.     Disposition:   FU with me in 6 months  Signed, Sueanne Margarita, MD  01/30/2015 11:06 AM    [facility] [place], [place], [place]  [address] Phone: [phone]; Fax: [phone]

## 2015-01-30 NOTE — Patient Instructions (Signed)
Medication Instructions:  Your physician recommends that you continue on your current medications as directed. Please refer to the Current Medication list given to you today.   Labwork: None  Testing/Procedures: None  Follow-Up: You have been referred to Dr. Toy Cookey for evaluation of oral device.  Your physician recommends that you schedule a follow-up appointment in: 3 months with Dr. Radford Pax.  Any Other Special Instructions Will Be Listed Below (If Applicable).

## 2015-02-16 ENCOUNTER — Ambulatory Visit (INDEPENDENT_AMBULATORY_CARE_PROVIDER_SITE_OTHER): Payer: Medicare Other

## 2015-02-16 DIAGNOSIS — Z23 Encounter for immunization: Secondary | ICD-10-CM | POA: Diagnosis not present

## 2015-02-22 ENCOUNTER — Ambulatory Visit (INDEPENDENT_AMBULATORY_CARE_PROVIDER_SITE_OTHER): Payer: Medicare Other | Admitting: Cardiology

## 2015-02-22 ENCOUNTER — Encounter: Payer: Self-pay | Admitting: Cardiology

## 2015-02-22 VITALS — BP 138/86 | HR 60 | Ht 69.0 in | Wt 197.8 lb

## 2015-02-22 DIAGNOSIS — G4733 Obstructive sleep apnea (adult) (pediatric): Secondary | ICD-10-CM

## 2015-02-22 DIAGNOSIS — I480 Paroxysmal atrial fibrillation: Secondary | ICD-10-CM

## 2015-02-22 DIAGNOSIS — I4891 Unspecified atrial fibrillation: Secondary | ICD-10-CM | POA: Diagnosis not present

## 2015-02-22 LAB — BASIC METABOLIC PANEL
BUN: 23 mg/dL (ref 7–25)
CHLORIDE: 103 mmol/L (ref 98–110)
CO2: 29 mmol/L (ref 20–31)
Calcium: 9.3 mg/dL (ref 8.6–10.3)
Creat: 1.2 mg/dL — ABNORMAL HIGH (ref 0.70–1.18)
Glucose, Bld: 70 mg/dL (ref 65–99)
Potassium: 3.6 mmol/L (ref 3.5–5.3)
Sodium: 138 mmol/L (ref 135–146)

## 2015-02-22 LAB — MAGNESIUM: MAGNESIUM: 2.1 mg/dL (ref 1.5–2.5)

## 2015-02-22 NOTE — Progress Notes (Signed)
Cardiology Office Note   Date:  02/22/2015   ID:  Daniel Tucker, Daniel Tucker 06/14/1940, MRN [id number]  PCP:  Unice Cobble, MD  Cardiologist: Darlin Coco MD  Chief Complaint  Patient presents with  . Follow-up      History of Present Illness: Daniel Tucker is a 74 y.o. male who presents for 3 month follow-up office visit  This gentleman is a medical patient of Dr. Unice Cobble. The patient has a past history of paroxysmal atrial fibrillation.   The patient underwent elective cardioversion on 10/12/14 and converted to normal sinus rhythm with an initial shock of 120 Joules. He states that he remained in normal rhythm for only 3 days after which he noticed that his heart was faster and irregular again. He has not been experiencing any chest pain. He does not think he has much change in stamina but there may be some decrease when he is not in normal rhythm. He has not had any TIA or stroke symptoms. He is not having any bleeding problems from the Apixaban. Echocardiogram on 09/11/14 shows ejection fraction 55-60% and slight mitral regurgitation. The patient was subsequently seen by Dr. Rayann Heman in the atrial fibrillation clinic.  He was felt to be a good candidate for Tikosyn for rhythm control.  He has done well on Tikosyn.  He has been maintaining normal sinus rhythm.  Feels well.  He denies any chest pain or shortness of breath or palpitations. Has been diagnosed with obstructive sleep apnea.  He has seen Dr. Fransico Him.  She has referred him to a dentist Dr. Toy Cookey who is going to treat his sleep apnea with a mouth guard. Since maintenance of normal sinus rhythm the patient has had good exercise tolerance.  Earlier this week he was able to play golf with his grandson which he enjoyed very much  Past Medical History  Diagnosis Date  . Tubular adenoma of colon 2006    Dr Fuller Plan  . Unspecified essential hypertension   . Fasting hyperglycemia 2006    FBS 111; normal A1c   . Hyperlipidemia   . Atrial fibrillation ([place]) 08/24/2014    a. 08/2014 Echo: EF 55-60%, no rwma, mild MR, mod-sev dil LA, mod dil RA, PASP 76mmHg;  b. 09/2014 DCCV ->Return to afib after 3 days;  b. CHA2DS2VASc = 1-->Eliquis.  . Sleep apnea     "had OR; thought it was gone; having another sleep study 12/2014" (11/20/2014)  . Hepatitis ~ 1955    "yellow juandice"  . Arthritis     "hips" (11/20/2014)    Past Surgical History  Procedure Laterality Date  . Uvulopalatopharyngoplasty  1997    Dr.Woliki  . Colonoscopy w/ polypectomy  2006    [place]  . Colonoscopy  2011    no polyp  . Prostate biopsy  2010    Negative,Dr.Ottelin  . 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 Cardioversion N/A 10/12/2014    Procedure: CARDIOVERSION;  Surgeon: Darlin Coco, MD;  Location: [facility] END[facility]COPY;  Service: Cardiovascular;  Laterality: N/A;  . Tonsillectomy and adenoidectomy  1997  . Nasal sinus surgery  1997  . Joint replacement       Current Outpatient Prescriptions  Medication Sig Dispense Refill  . acetaminophen (TYLENOL) 500 MG chewable tablet Chew 500 mg by mouth every 6 (six) hours as needed for pain.    Marland Kitchen acyclovir (ZOVIRAX) 200 MG capsule Take 400 mg by mouth daily.    Marland Kitchen  amLODipine (NORVASC) 5 MG tablet Take 5 mg by mouth daily.    Marland Kitchen apixaban (ELIQUIS) 5 MG TABS tablet Take 1 tablet (5 mg total) by mouth 2 (two) times daily. 180 tablet 3  . Ascorbic Acid (VITAMIN C) 1000 MG tablet Take 1,000 mg by mouth daily.      Marland Kitchen dofetilide (TIKOSYN) 500 MCG capsule Take 1 capsule (500 mcg total) by mouth 2 (two) times daily.    Marland Kitchen GLUCOSAMINE SULFATE PO Take 2 tablets by mouth daily. Will start back med in January 2017    . metoprolol (LOPRESSOR) 50 MG tablet Take 1 tablet (50 mg total) by mouth 2 (two) times daily. 180 tablet 3  . Multiple Vitamin (MULTIVITAMIN) tablet Take 1 tablet by mouth daily.     . Multiple Vitamins-Minerals (PRESERVISION AREDS 2 PO) Take 1 tablet by mouth 2 (two) times  daily.    . potassium chloride SA (K-DUR,KLOR-CON) 20 MEQ tablet Take 2 tablets (40 mEq total) by mouth daily. 180 tablet 3  . pravastatin (PRAVACHOL) 20 MG tablet Take 20 mg by mouth at bedtime.      No current facility-administered medications for this visit.    Allergies:   Review of patient's allergies indicates no known allergies.    Social History:  The patient  reports that he has quit smoking. His smoking use included Cigarettes. He has a 7 pack-year smoking history. He has never used smokeless tobacco. He reports that he drinks about 1.2 oz of alcohol per week. He reports that he does not use illicit drugs.   Family History:  The patient's family history includes Atrial fibrillation in his brother; Hypertension in his father and mother; Lupus in his sister; Melanoma in his brother and sister; Prostate cancer in his brother and father. There is no history of Diabetes, Heart disease, or Stroke.    ROS:  Please see the history of present illness.   Otherwise, review of systems are positive for none.   All other systems are reviewed and negative.    PHYSICAL EXAM: VS:  BP 138/86 mmHg  Pulse 60  Ht 5\' 9"  (1.753 m)  Wt 197 lb 12.8 oz (89.721 kg)  BMI 29.20 kg/m2 , BMI Body mass index is 29.2 kg/(m^2). GEN: Well nourished, well developed, in no acute distress HEENT: normal Neck: no JVD, carotid bruits, or masses Cardiac: RRR; no murmurs, rubs, or gallops,no edema  Respiratory:  clear to auscultation bilaterally, normal work of breathing GI: soft, nontender, nondistended, + BS MS: no deformity or atrophy Skin: warm and dry, no rash Neuro:  Strength and sensation are intact Psych: euthymic mood, full affect   EKG:  EKG is ordered today. The ekg ordered today demonstrates sinus bradycardia at 55 bpm.  There is an old right bundle branch block.  The QTc interval is 480 ms.  Since the prior tracing of 12/21/14, no significant change.  Recent Labs: 08/24/2014: ALT 16; TSH  1.21 10/09/2014: Hemoglobin 15.4; Platelets 203.0 02/22/2015: BUN 23; Creat 1.20*; Magnesium 2.1; Potassium 3.6; Sodium 138    Lipid Panel    Component Value Date/Time   CHOL 162 08/24/2014 1042   CHOL 155 08/17/2013 1126   TRIG 77 08/24/2014 1042   TRIG 91.0 08/17/2013 1126   HDL 50 08/24/2014 1042   HDL 45.80 08/17/2013 1126   CHOLHDL 3 08/17/2013 1126   VLDL 18.2 08/17/2013 1126   LDLCALC 97 08/24/2014 1042   LDLCALC 91 08/17/2013 1126      Wt Readings from  Last 3 Encounters:  02/22/15 197 lb 12.8 oz (89.721 kg)  01/30/15 200 lb 1.9 oz (90.774 kg)  12/21/14 197 lb 9.6 oz (89.631 kg)        ASSESSMENT AND PLAN:  1.Paroxysmal atrial fibrillation.  The patient is maintaining normal sinus rhythm on Tikosyn . 2. Essential hypertension 3. Bifascicular block    Current medicines are reviewed at length with the patient today.  The patient does not have concerns regarding medicines.  The following changes have been made:  no change  Labs/ tests ordered today include:   Orders Placed This Encounter  Procedures  . Basic metabolic panel  . Magnesium  . EKG 12-Lead     Disposition: Continue current medication.  We are checking BMET and magnesium level today. Check in 3 months for follow-up with Roderic Palau in the atrial fibrillation clinic  Signed, Darlin Coco MD 02/22/2015 2:38 PM    [facility] [place], [place], [place]  [address] Phone: [phone]; Fax: [phone]

## 2015-02-22 NOTE — Patient Instructions (Signed)
Medication Instructions:  Your physician recommends that you continue on your current medications as directed. Please refer to the Current Medication list given to you today.  Labwork: bmet/magnesium  Testing/Procedures: none  Follow-Up: Your physician recommends that you schedule a follow-up appointment in: 3 month ov/ekg with [facility] Penny [facility] fib clin[facility]   If you need a refill on your cardiac medications before your next appointment, please call your pharmacy.

## 2015-02-23 ENCOUNTER — Telehealth: Payer: Self-pay | Admitting: *Deleted

## 2015-02-23 DIAGNOSIS — E876 Hypokalemia: Secondary | ICD-10-CM

## 2015-02-23 MED ORDER — POTASSIUM CHLORIDE CRYS ER 20 MEQ PO TBCR: 20.0000 meq | EXTENDED_RELEASE_TABLET | Freq: Three times a day (TID) | ORAL | Status: DC

## 2015-02-23 NOTE — Telephone Encounter (Signed)
-----   Message from Darlin Coco, MD sent at 02/23/2015  7:54 AM EDT ----- Please report.  The magnesium level is normal.  The potassium level is too low for Tikosyn.  We like it at 4.0 or better.  It is 3.6.  Increase potassium tablets up to 3 times a day instead of twice a day.  Recheck a BMET in 2-3 weeks.

## 2015-02-23 NOTE — Telephone Encounter (Signed)
Advised patient of lab results and medication changes Did schedule f/u labs

## 2015-03-08 ENCOUNTER — Other Ambulatory Visit (INDEPENDENT_AMBULATORY_CARE_PROVIDER_SITE_OTHER): Payer: Medicare Other | Admitting: *Deleted

## 2015-03-08 ENCOUNTER — Telehealth: Payer: Self-pay | Admitting: *Deleted

## 2015-03-08 DIAGNOSIS — E876 Hypokalemia: Secondary | ICD-10-CM

## 2015-03-08 LAB — BASIC METABOLIC PANEL
BUN: 18 mg/dL (ref 7–25)
CO2: 29 mmol/L (ref 20–31)
Calcium: 9.2 mg/dL (ref 8.6–10.3)
Chloride: 102 mmol/L (ref 98–110)
Creat: 1.04 mg/dL (ref 0.70–1.18)
Glucose, Bld: 111 mg/dL — ABNORMAL HIGH (ref 65–99)
POTASSIUM: 3.7 mmol/L (ref 3.5–5.3)
SODIUM: 139 mmol/L (ref 135–146)

## 2015-03-08 NOTE — Progress Notes (Signed)
Quick Note:  Please report to patient. The potassium is still low for patient on Tikosyn. Increase potassium by an additional 20 meq tablet daily. Recheck BMET in one month. ______

## 2015-03-08 NOTE — Telephone Encounter (Signed)
Confirmed patient is taking 3 tablets daily Advised patient to increase to 4 tablets daily and recheck in 1 month, verbalized understanding

## 2015-03-08 NOTE — Telephone Encounter (Signed)
-----   Message from Darlin Coco, MD sent at 03/08/2015  4:18 PM EST ----- Please report to patient.  The potassium is still low for patient on Tikosyn. Increase potassium by an additional 20 meq tablet daily. Recheck BMET in one month.

## 2015-03-09 ENCOUNTER — Other Ambulatory Visit: Payer: Medicare Other

## 2015-04-02 ENCOUNTER — Encounter: Payer: Self-pay | Admitting: Internal Medicine

## 2015-04-02 ENCOUNTER — Ambulatory Visit (INDEPENDENT_AMBULATORY_CARE_PROVIDER_SITE_OTHER): Payer: Medicare Other | Admitting: Internal Medicine

## 2015-04-02 VITALS — BP 140/88 | HR 50 | Temp 97.9°F | Resp 16 | Wt 198.0 lb

## 2015-04-02 DIAGNOSIS — M1990 Unspecified osteoarthritis, unspecified site: Secondary | ICD-10-CM | POA: Diagnosis not present

## 2015-04-02 DIAGNOSIS — I480 Paroxysmal atrial fibrillation: Secondary | ICD-10-CM | POA: Diagnosis not present

## 2015-04-02 DIAGNOSIS — I10 Essential (primary) hypertension: Secondary | ICD-10-CM | POA: Diagnosis not present

## 2015-04-02 DIAGNOSIS — E785 Hyperlipidemia, unspecified: Secondary | ICD-10-CM | POA: Diagnosis not present

## 2015-04-02 DIAGNOSIS — M1611 Unilateral primary osteoarthritis, right hip: Secondary | ICD-10-CM | POA: Insufficient documentation

## 2015-04-02 NOTE — Progress Notes (Signed)
Pre visit review using our clinic review tool, if applicable. No additional management support is needed unless otherwise documented below in the visit note. 

## 2015-04-02 NOTE — Assessment & Plan Note (Signed)
BP Readings from Last 3 Encounters:  04/02/15 140/88  02/22/15 138/86  01/30/15 142/86   bp borderline high here today, better controlled at home Continue to monitor at home Continue regular exercise and low sodium diet Continue current medications

## 2015-04-02 NOTE — Patient Instructions (Signed)
  We have reviewed your prior records including labs and tests today.  All other Health Maintenance issues reviewed.   All recommended immunizations and age-appropriate screenings are up-to-date.  No immunizations administered today.   Medications reviewed and updated.   No changes recommended at this time.

## 2015-04-02 NOTE — Assessment & Plan Note (Signed)
Lipid panel checked earlier this year Exercising Continue pravastatin 20 mg daily

## 2015-04-02 NOTE — Progress Notes (Signed)
Subjective:    Patient ID: Daniel Tucker, male    DOB: 1940-05-13, 74 y.o.   MRN: [id number]  HPI He is here to establish with a new pcp.    Afib:  He was diagnosed with afib earlier this year.  He is following with cardiology.  He is taking his medications daily. He denies chest pain, palpitations, sob and lightheadedness.    Hypertension: He is taking his medication daily. He is compliant with a low sodium diet.  He denies chest pain, palpitations, edema, shortness of breath and regular headaches. He is exercising regularly - walking.  He does monitor his blood pressure at home - it is usually 130/80's.    Hyperlipidemia: He is taking his medication daily. He is compliant with a low fat/cholesterol diet. He is exercising regularly. He denies myalgias.   Right shoulder pain:  15 yrear ago he had right shoulder pain.  He was told by ortho it was impingement and he did exercises.  It got better.  On occasion he has mild pain, but it is tolerable.  He does the exercises as needed  Medications and allergies reviewed with patient and updated if appropriate.  Patient Active Problem List   Diagnosis Date Noted  . Persistent atrial fibrillation ([place])   . Atrial fibrillation ([place]) 08/24/2014  . Obstructive sleep apnea 07/28/2012  . Nonspecific abnormal electrocardiogram (ECG) (EKG) 05/15/2011  . Hyperlipidemia 10/10/2008  . Essential hypertension 10/10/2008  . History of colonic polyps 10/10/2008  . 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 acetaminophen (TYLENOL) 500 MG chewable tablet Chew 500 mg by mouth every 6 (six) hours as needed for pain.    Marland Kitchen acyclovir (ZOVIRAX) 200 MG capsule Take 400 mg by mouth daily.    Marland Kitchen amLODipine (NORVASC) 5 MG tablet Take 5 mg by mouth daily.    Marland Kitchen apixaban (ELIQUIS) 5 MG TABS tablet Take 1 tablet (5 mg total) by mouth 2 (two) times daily. 180 tablet  3  . Ascorbic Acid (VITAMIN C) 1000 MG tablet Take 1,000 mg by mouth daily.      Marland Kitchen dofetilide (TIKOSYN) 500 MCG capsule Take 1 capsule (500 mcg total) by mouth 2 (two) times daily.    Marland Kitchen GLUCOSAMINE SULFATE PO Take 2 tablets by mouth daily. Will start back med in January 2017    . metoprolol (LOPRESSOR) 50 MG tablet Take 1 tablet (50 mg total) by mouth 2 (two) times daily. 180 tablet 3  . Multiple Vitamin (MULTIVITAMIN) tablet Take 1 tablet by mouth daily.     . Multiple Vitamins-Minerals (PRESERVISION AREDS 2 PO) Take 1 tablet by mouth 2 (two) times daily.    . potassium chloride SA (K-DUR,KLOR-CON) 20 MEQ tablet Take 20 mEq by mouth 4 (four) times daily.    . pravastatin (PRAVACHOL) 20 MG tablet Take 20 mg by mouth at bedtime.      No current facility-administered medications on file prior to visit.    Past Medical History  Diagnosis Date  . Tubular adenoma of colon 2006    Dr Fuller Plan  . Unspecified essential hypertension   . Fasting hyperglycemia 2006    FBS 111; normal A1c  . Hyperlipidemia   . Atrial fibrillation ([place]) 08/24/2014    a. 08/2014 Echo: EF 55-60%, no rwma, mild MR, mod-sev dil LA, mod dil RA, PASP 23mmHg;  b. 09/2014 DCCV ->Return to afib after 3  days;  b. CHA2DS2VASc = 1-->Eliquis.  . Sleep apnea     "had OR; thought it was gone; having another sleep study 12/2014" (11/20/2014)  . Hepatitis ~ 1955    "yellow juandice"  . Arthritis     "hips" (11/20/2014)    Past Surgical History  Procedure Laterality Date  . Uvulopalatopharyngoplasty  1997    Dr.Woliki  . Colonoscopy w/ polypectomy  2006    [place]  . Colonoscopy  2011    no polyp  . Prostate biopsy  2010    Negative,Dr.Ottelin  . 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 Cardioversion N/A 10/12/2014    Procedure: CARDIOVERSION;  Surgeon: Darlin Coco, MD;  Location: [facility] ENDOSCOPY;  Service: Cardiovascular;  Laterality: N/A;  . Tonsillectomy and adenoidectomy  1997  . Nasal sinus surgery  1997  .  Joint replacement      Social History   Social History  . Marital Status: Married    Spouse Name: N/A  . Number of Children: N/A  . Years of Education: N/A   Social History Main Topics  . Smoking status: Former Smoker -- 1.00 packs/day for 7 years    Types: Cigarettes  . Smokeless tobacco: Never Used     Comment: smoked cigarettes age [age], up to 1ppd  . Alcohol Use: 1.2 oz/week    1 Glasses of wine, 1 Cans of beer per week  . Drug Use: No  . Sexual Activity: Yes   Other Topics Concern  . None   Social History Narrative    Review of Systems  Constitutional: Negative for fever and chills.  Respiratory: Negative for cough, shortness of breath and wheezing.   Cardiovascular: Negative for chest pain, palpitations and leg swelling.  Gastrointestinal: Negative for nausea and abdominal pain.  Musculoskeletal: Positive for arthralgias.  Neurological: Negative for dizziness, light-headedness and headaches.  Psychiatric/Behavioral: Negative for dysphoric mood. The patient is not nervous/anxious.        Objective:   Filed Vitals:   04/02/15 1328  BP: 140/88  Pulse: 50  Temp: 97.9 F (36.6 C)  Resp: 16   Filed Weights   04/02/15 1328  Weight: 198 lb (89.812 kg)   Body mass index is 29.23 kg/(m^2).   Physical Exam Constitutional: Appears well-developed and well-nourished. No distress.  Neck: Neck supple. No tracheal deviation present. No thyromegaly present.  No carotid bruit. No cervical adenopathy.   Cardiovascular: Normal rate, regular rhythm and normal heart sounds.   No murmur heard. Pulmonary/Chest: Effort normal and breath sounds normal. No respiratory distress. No wheezes.  Musculoskeletal: No edema.        Assessment & Plan:    See Problem List for A/P  Follow up annually for PE

## 2015-04-02 NOTE — Assessment & Plan Note (Signed)
Rate and rhythm controlled Following with cardio Continue current meds

## 2015-04-02 NOTE — Assessment & Plan Note (Signed)
Following with orthopedics Exercising regularly

## 2015-04-09 ENCOUNTER — Other Ambulatory Visit (INDEPENDENT_AMBULATORY_CARE_PROVIDER_SITE_OTHER): Payer: Medicare Other | Admitting: *Deleted

## 2015-04-09 DIAGNOSIS — I10 Essential (primary) hypertension: Secondary | ICD-10-CM | POA: Diagnosis not present

## 2015-04-09 LAB — BASIC METABOLIC PANEL
BUN: 19 mg/dL (ref 7–25)
CO2: 27 mmol/L (ref 20–31)
Calcium: 9.1 mg/dL (ref 8.6–10.3)
Chloride: 102 mmol/L (ref 98–110)
Creat: 1.18 mg/dL (ref 0.70–1.18)
Glucose, Bld: 79 mg/dL (ref 65–99)
Potassium: 4.2 mmol/L (ref 3.5–5.3)
Sodium: 137 mmol/L (ref 135–146)

## 2015-04-10 NOTE — Progress Notes (Signed)
Quick Note:  Please report to patient. The recent labs are stable. Continue same medication and careful diet. ______ 

## 2015-05-01 ENCOUNTER — Ambulatory Visit (HOSPITAL_COMMUNITY)
Admission: RE | Admit: 2015-05-01 | Discharge: 2015-05-01 | Disposition: A | Discharge status: Home / Self Care | Payer: Medicare Other | Source: Ambulatory Visit | Attending: Nurse Practitioner | Admitting: Nurse Practitioner

## 2015-05-01 ENCOUNTER — Encounter (HOSPITAL_COMMUNITY): Payer: Self-pay | Admitting: Nurse Practitioner

## 2015-05-01 VITALS — BP 142/72 | HR 57 | Ht 69.0 in | Wt 201.8 lb

## 2015-05-01 DIAGNOSIS — I4891 Unspecified atrial fibrillation: Secondary | ICD-10-CM | POA: Diagnosis not present

## 2015-05-01 DIAGNOSIS — I4819 Other persistent atrial fibrillation: Secondary | ICD-10-CM

## 2015-05-01 DIAGNOSIS — I10 Essential (primary) hypertension: Secondary | ICD-10-CM | POA: Diagnosis not present

## 2015-05-01 DIAGNOSIS — I481 Persistent atrial fibrillation: Secondary | ICD-10-CM | POA: Diagnosis not present

## 2015-05-01 DIAGNOSIS — G4733 Obstructive sleep apnea (adult) (pediatric): Secondary | ICD-10-CM | POA: Diagnosis not present

## 2015-05-01 LAB — BASIC METABOLIC PANEL
Anion gap: 9 (ref 5–15)
BUN: 19 mg/dL (ref 6–20)
CO2: 30 mmol/L (ref 22–32)
CREATININE: 1.14 mg/dL (ref 0.61–1.24)
Calcium: 9.4 mg/dL (ref 8.9–10.3)
Chloride: 103 mmol/L (ref 101–111)
GFR calc Af Amer: >60 mL/min (ref >60)
GFR calc non Af Amer: >60 mL/min (ref >60)
GLUCOSE: 130 mg/dL — AB (ref 65–99)
Potassium: 4.2 mmol/L (ref 3.5–5.1)
Sodium: 142 mmol/L (ref 135–145)

## 2015-05-01 LAB — CBC
HEMATOCRIT: 43.6 % (ref 39.0–52.0)
Hemoglobin: 14.4 g/dL (ref 13.0–17.0)
MCH: 32.2 pg (ref 26.0–34.0)
MCHC: 33 g/dL (ref 30.0–36.0)
MCV: 97.5 fL (ref 78.0–100.0)
Platelets: 202 10*3/uL (ref 150–400)
RBC: 4.47 MIL/uL (ref 4.22–5.81)
RDW: 12.3 % (ref 11.5–15.5)
WBC: 5.4 10*3/uL (ref 4.0–10.5)

## 2015-05-01 LAB — MAGNESIUM: Magnesium: 2.2 mg/dL (ref 1.7–2.4)

## 2015-05-01 NOTE — Progress Notes (Signed)
Patient ID: Daniel Tucker, male   DOB: 02-26-1941, 75 y.o.   MRN: [id number]     Primary Care Physician: Binnie Rail, MD Referring Physician: Dr. Jovita Kussmaul is a 75 y.o. male with a h/o persistent  afib maintaining SR on tikosyn. He reports that he feels well.No issues with blood thinner. Not aware of any afib.  Today, he denies symptoms of palpitations, chest pain, shortness of breath, orthopnea, PND, lower extremity edema, dizziness, presyncope, syncope, or neurologic sequela. The patient is tolerating medications without difficulties and is otherwise without complaint today.   Past Medical History  Diagnosis Date  . Tubular adenoma of colon 2006    Dr Fuller Plan  . Unspecified essential hypertension   . Fasting hyperglycemia 2006    FBS 111; normal A1c  . Hyperlipidemia   . Atrial fibrillation ([place]) 08/24/2014    a. 08/2014 Echo: EF 55-60%, no rwma, mild MR, mod-sev dil LA, mod dil RA, PASP 7mmHg;  b. 09/2014 DCCV ->Return to afib after 3 days;  b. CHA2DS2VASc = 1-->Eliquis.  . Sleep apnea     "had OR; thought it was gone; having another sleep study 12/2014" (11/20/2014)  . Hepatitis ~ 1955    "yellow juandice"  . Arthritis     "hips" (11/20/2014)   Past Surgical History  Procedure Laterality Date  . Uvulopalatopharyngoplasty  1997    Dr.Woliki  . Colonoscopy w/ polypectomy  2006    [place]  . Colonoscopy  2011    no polyp  . Prostate biopsy  2010    Negative,Dr.Ottelin  . 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 Cardioversion N/A 10/12/2014    Procedure: CARDIOVERSION;  Surgeon: Darlin Coco, MD;  Location: [facility] ENDOSCOPY;  Service: Cardiovascular;  Laterality: N/A;  . Tonsillectomy and adenoidectomy  1997  . Nasal sinus surgery  1997  . Joint replacement      Current Outpatient Prescriptions  Medication Sig Dispense Refill  . acetaminophen (TYLENOL) 500 MG chewable tablet Chew 500 mg by mouth every 6 (six) hours as needed for pain.    Marland Kitchen  acyclovir (ZOVIRAX) 200 MG capsule Take 400 mg by mouth daily.    Marland Kitchen amLODipine (NORVASC) 5 MG tablet Take 5 mg by mouth daily.    Marland Kitchen apixaban (ELIQUIS) 5 MG TABS tablet Take 1 tablet (5 mg total) by mouth 2 (two) times daily. 180 tablet 3  . Ascorbic Acid (VITAMIN C) 1000 MG tablet Take 1,000 mg by mouth daily.      Marland Kitchen dofetilide (TIKOSYN) 500 MCG capsule Take 1 capsule (500 mcg total) by mouth 2 (two) times daily.    Marland Kitchen GLUCOSAMINE SULFATE PO Take 2 tablets by mouth daily. Will start back med in January 2017    . metoprolol (LOPRESSOR) 50 MG tablet Take 1 tablet (50 mg total) by mouth 2 (two) times daily. 180 tablet 3  . Multiple Vitamin (MULTIVITAMIN) tablet Take 1 tablet by mouth daily.     . Multiple Vitamins-Minerals (PRESERVISION AREDS 2 PO) Take 1 tablet by mouth 2 (two) times daily.    . potassium chloride SA (K-DUR,KLOR-CON) 20 MEQ tablet Take 20 mEq by mouth 4 (four) times daily.    . pravastatin (PRAVACHOL) 20 MG tablet Take 20 mg by mouth at bedtime.      No current facility-administered medications for this encounter.    No Known Allergies  Social History   Social History  . Marital Status: Married  Spouse Name: N/A  . Number of Children: N/A  . Years of Education: N/A   Occupational History  . Not on file.   Social History Main Topics  . Smoking status: Former Smoker -- 1.00 packs/day for 7 years    Types: Cigarettes  . Smokeless tobacco: Never Used     Comment: smoked cigarettes age [age], up to 1ppd  . Alcohol Use: 1.2 oz/week    1 Glasses of wine, 1 Cans of beer per week  . Drug Use: No  . Sexual Activity: Yes   Other Topics Concern  . Not on file   Social History Narrative    Family History  Problem Relation Age of Onset  . Prostate cancer Father   . Hypertension Father   . Hypertension Mother   . Prostate cancer Brother   . Diabetes Neg Hx   . Heart disease Neg Hx   . Stroke Neg Hx   . Melanoma Sister   . Atrial fibrillation Brother   .  Melanoma Brother   . Lupus Sister     ROS- All systems are reviewed and negative except as per the HPI above  Physical Exam: Filed Vitals:   05/01/15 1332  BP: 142/72  Pulse: 57  Height: 5\' 9"  (1.753 m)  Weight: 201 lb 12.8 oz (91.536 kg)    GEN- The patient is well appearing, alert and oriented x 3 today.   Head- normocephalic, atraumatic Eyes-  Sclera clear, conjunctiva pink Ears- hearing intact Oropharynx- clear Neck- supple, no JVP Lymph- no cervical lymphadenopathy Lungs- Clear to ausculation bilaterally, normal work of breathing Heart- Regular rate and rhythm, no murmurs, rubs or gallops, PMI not laterally displaced GI- soft, NT, ND, + BS Extremities- no clubbing, cyanosis, or edema MS- no significant deformity or atrophy Skin- no rash or lesion Psych- euthymic mood, full affect Neuro- strength and sensation are intact  EKG- Sinus brady with RBBB, LAFB, pr int 156 ms, qrs int 134 ms, qtc 443 ms.  Epic records reviewed.  Assessment and Plan: 1. afib Continue on tikosyn 500 mg bid, qtc stable Continue apixaban, no bleeding issues Bmet/mag/cbc today  2.OSA F/u with Dr. Radford Pax as scheduled tomorrow.  3. HTN  Stable  F/u in 3 months in afib clinic  Errin Whitelaw C. Anihya Tuma, [facility] [address] [place], [place] [address] [phone]

## 2015-05-02 ENCOUNTER — Ambulatory Visit (INDEPENDENT_AMBULATORY_CARE_PROVIDER_SITE_OTHER): Payer: Medicare Other | Admitting: Cardiology

## 2015-05-02 ENCOUNTER — Encounter: Payer: Self-pay | Admitting: Cardiology

## 2015-05-02 VITALS — BP 110/80 | HR 62 | Ht 69.0 in | Wt 203.6 lb

## 2015-05-02 DIAGNOSIS — I480 Paroxysmal atrial fibrillation: Secondary | ICD-10-CM

## 2015-05-02 DIAGNOSIS — I10 Essential (primary) hypertension: Secondary | ICD-10-CM

## 2015-05-02 DIAGNOSIS — G4733 Obstructive sleep apnea (adult) (pediatric): Secondary | ICD-10-CM

## 2015-05-02 NOTE — Progress Notes (Signed)
Cardiology Office Note   Date:  05/02/2015   ID:  Daniel Tucker, DOB 1941-01-11, MRN [id number]  PCP:  Binnie Rail, MD    Chief Complaint  Patient presents with  . Sleep Apnea  . Atrial Fibrillation      History of Present Illness: Daniel Tucker is a 75 y.o. male who presents for followup of OSA. He has a history of atrial fibrillation and recently underwent a sleep study which showed mild OSA with an AHI of 12.3/hr and oxygen desaturations to 86% with soft snoring.He has a history of severe snoring years ago which improved with tonsillectomy and uvuloplasty. He was referred to dentistry for oral device and is doing well with it.  He tolerates it without any discomfort.  He has a home sleep study last night.  He says that his wife says he no longer snores and he feels more rested in the am with less daytime sleepiness.  He denies any LE edema or palpitations.   Past Medical History  Diagnosis Date  . Tubular adenoma of colon 2006    Dr Fuller Plan  . Unspecified essential hypertension   . Fasting hyperglycemia 2006    FBS 111; normal A1c  . Hyperlipidemia   . Atrial fibrillation ([place]) 08/24/2014    a. 08/2014 Echo: EF 55-60%, no rwma, mild MR, mod-sev dil LA, mod dil RA, PASP 29mmHg;  b. 09/2014 DCCV ->Return to afib after 3 days;  b. CHA2DS2VASc = 1-->Eliquis.  . Sleep apnea     "had OR; thought it was gone; having another sleep study 12/2014" (11/20/2014)  . Hepatitis ~ 1955    "yellow juandice"  . Arthritis     "hips" (11/20/2014)    Past Surgical History  Procedure Laterality Date  . Uvulopalatopharyngoplasty  1997    Dr.Woliki  . Colonoscopy w/ polypectomy  2006    [place]  . Colonoscopy  2011    no polyp  . Prostate biopsy  2010    Negative,Dr.Ottelin  . 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 Cardioversion N/A 10/12/2014    Procedure: CARDIOVERSION;  Surgeon: Darlin Coco, MD;  Location: [facility] ENDOSCOPY;  Service: Cardiovascular;   Laterality: N/A;  . Tonsillectomy and adenoidectomy  1997  . Nasal sinus surgery  1997  . Joint replacement       Current Outpatient Prescriptions  Medication Sig Dispense Refill  . acetaminophen (TYLENOL) 500 MG chewable tablet Chew 500 mg by mouth every 6 (six) hours as needed for pain.    Marland Kitchen acyclovir (ZOVIRAX) 200 MG capsule Take 400 mg by mouth daily.    Marland Kitchen amLODipine (NORVASC) 5 MG tablet Take 5 mg by mouth daily.    Marland Kitchen apixaban (ELIQUIS) 5 MG TABS tablet Take 1 tablet (5 mg total) by mouth 2 (two) times daily. 180 tablet 3  . Ascorbic Acid (VITAMIN C) 1000 MG tablet Take 1,000 mg by mouth daily.      Marland Kitchen dofetilide (TIKOSYN) 500 MCG capsule Take 1 capsule (500 mcg total) by mouth 2 (two) times daily.    Marland Kitchen GLUCOSAMINE SULFATE PO Take 2 tablets by mouth daily. Will start back med in January 2017    . metoprolol (LOPRESSOR) 50 MG tablet Take 1 tablet (50 mg total) by mouth 2 (two) times daily. 180 tablet 3  . Multiple Vitamin (MULTIVITAMIN) tablet Take 1 tablet by mouth daily.     Marland Kitchen  Multiple Vitamins-Minerals (PRESERVISION AREDS 2 PO) Take 1 tablet by mouth 2 (two) times daily.    . potassium chloride SA (K-DUR,KLOR-CON) 20 MEQ tablet Take 20 mEq by mouth 4 (four) times daily.    . pravastatin (PRAVACHOL) 20 MG tablet Take 20 mg by mouth at bedtime.      No current facility-administered medications for this visit.    Allergies:   Review of patient's allergies indicates no known allergies.    Social History:  The patient  reports that he has quit smoking. His smoking use included Cigarettes. He has a 7 pack-year smoking history. He has never used smokeless tobacco. He reports that he drinks about 1.2 oz of alcohol per week. He reports that he does not use illicit drugs.   Family History:  The patient's family history includes Atrial fibrillation in his brother; Hypertension in his father and mother; Lupus in his sister; Melanoma in his brother and sister; Prostate cancer in his brother  and father. There is no history of Diabetes, Heart disease, or Stroke.    ROS:  Please see the history of present illness.   Otherwise, review of systems are positive for none.   All other systems are reviewed and negative.    PHYSICAL EXAM: VS:  BP 110/80 mmHg  Pulse 62  Ht 5\' 9"  (1.753 m)  Wt 203 lb 9.6 oz (92.352 kg)  BMI 30.05 kg/m2 , BMI Body mass index is 30.05 kg/(m^2). GEN: Well nourished, well developed, in no acute distress HEENT: normal Neck: no JVD, carotid bruits, or masses Cardiac: RRR; no murmurs, rubs, or gallops,no edema  Respiratory:  clear to auscultation bilaterally, normal work of breathing GI: soft, nontender, nondistended, + BS MS: no deformity or atrophy Skin: warm and dry, no rash Neuro:  Strength and sensation are intact Psych: euthymic mood, full affect   EKG:  EKG is not ordered today.    Recent Labs: 08/24/2014: ALT 16; TSH 1.21 05/01/2015: BUN 19; Creatinine, Ser 1.14; Hemoglobin 14.4; Magnesium 2.2; Platelets 202; Potassium 4.2; Sodium 142    Lipid Panel    Component Value Date/Time   CHOL 162 08/24/2014 1042   CHOL 155 08/17/2013 1126   TRIG 77 08/24/2014 1042   TRIG 91.0 08/17/2013 1126   HDL 50 08/24/2014 1042   HDL 45.80 08/17/2013 1126   CHOLHDL 3 08/17/2013 1126   VLDL 18.2 08/17/2013 1126   LDLCALC 97 08/24/2014 1042   LDLCALC 91 08/17/2013 1126      Wt Readings from Last 3 Encounters:  05/02/15 203 lb 9.6 oz (92.352 kg)  05/01/15 201 lb 12.8 oz (91.536 kg)  04/02/15 198 lb (89.812 kg)     ASSESSMENT AND PLAN:  1. Mild OSA now using an oral device with no problems.  Repeat home sleep study is pending.   2. HTN - cnotrolled on amlodipine/BB 3. PAF - maintaining NSR on TIkosyn/BB/Apixaban    Current medicines are reviewed at length with the patient today.  The patient does not have concerns regarding medicines.  The following changes have been made:  no change  Labs/ tests ordered today: See above Assessment and  Plan No orders of the defined types were placed in this encounter.     Disposition:   FU with me in 1 year  Signed, Sueanne Margarita, MD  05/02/2015 10:08 AM    [facility] [place], [place], [place]  [address] Phone: [phone]; Fax: [phone]

## 2015-05-02 NOTE — Patient Instructions (Signed)

## 2015-05-07 ENCOUNTER — Other Ambulatory Visit (HOSPITAL_COMMUNITY): Payer: Self-pay | Admitting: *Deleted

## 2015-05-07 MED ORDER — POTASSIUM CHLORIDE CRYS ER 20 MEQ PO TBCR: 20.0000 meq | EXTENDED_RELEASE_TABLET | Freq: Four times a day (QID) | ORAL | Status: DC

## 2015-05-08 ENCOUNTER — Encounter: Payer: Self-pay | Admitting: Gastroenterology

## 2015-06-07 DIAGNOSIS — M1611 Unilateral primary osteoarthritis, right hip: Secondary | ICD-10-CM | POA: Diagnosis not present

## 2015-07-16 ENCOUNTER — Ambulatory Visit (INDEPENDENT_AMBULATORY_CARE_PROVIDER_SITE_OTHER): Payer: Medicare Other | Admitting: Gastroenterology

## 2015-07-16 ENCOUNTER — Telehealth: Payer: Self-pay

## 2015-07-16 ENCOUNTER — Encounter: Payer: Self-pay | Admitting: Gastroenterology

## 2015-07-16 VITALS — BP 160/70 | HR 60 | Ht 67.5 in | Wt 197.1 lb

## 2015-07-16 DIAGNOSIS — Z7901 Long term (current) use of anticoagulants: Secondary | ICD-10-CM

## 2015-07-16 DIAGNOSIS — K5900 Constipation, unspecified: Secondary | ICD-10-CM | POA: Diagnosis not present

## 2015-07-16 DIAGNOSIS — K921 Melena: Secondary | ICD-10-CM

## 2015-07-16 DIAGNOSIS — Z86010 Personal history of colonic polyps: Secondary | ICD-10-CM | POA: Diagnosis not present

## 2015-07-16 MED ORDER — NA SULFATE-K SULFATE-MG SULF 17.5-3.13-1.6 GM/177ML PO SOLN: 1.0000 | Freq: Once | ORAL | Status: DC

## 2015-07-16 NOTE — Telephone Encounter (Signed)
  07/16/2015   RE: BASIL GADISON DOB: Nov 24, 1940 MRN: [id number]   Dear Dr. Radford Pax,    We have scheduled the above patient for an endoscopic procedure. Our records show that he is on anticoagulation therapy.   Please advise as to how long the patient may come off his therapy of Eliquis prior to the procedure, which is scheduled for 08/02/15.  Please route your answer to Marlon Pel, [place]..   Sincerely,    Marlon Pel, CMA

## 2015-07-16 NOTE — Progress Notes (Signed)
    History of Present Illness: This is a 75 year old male with a personal history of adenomatous colon polyps, small-volume hematochezia and mild constipation. He developed A. fib last year and is currently maintained on Elavil this. He had a 3 mm adenomatous colon polyp removed in 2006. Colonoscopy in March 2011 was normal. He notes infrequent episodes of small amounts of bright red blood per rectum with bowel movements usually when he is mildly constipated. He has no other GI complaints. Denies weight loss, abdominal pain, diarrhea, change in stool caliber, melena, nausea, vomiting, dysphagia, reflux symptoms, chest pain.  Review of Systems: Pertinent positive and negative review of systems were noted in the above HPI section. All other review of systems were otherwise negative.  Current Medications, Allergies, Past Medical History, Past Surgical History, Family History and Social History were reviewed in [employer] record.  Physical Exam: General: Well developed, well nourished, no acute distress Head: Normocephalic and atraumatic Eyes:  sclerae anicteric, EOMI Ears: Normal auditory acuity Mouth: No deformity or lesions Neck: Supple, no masses or thyromegaly Lungs: Clear throughout to auscultation Heart: Regular rate and rhythm; no murmurs, rubs or bruits Abdomen: Soft, non tender and non distended. No masses, hepatosplenomegaly or hernias noted. Normal Bowel sounds Rectal: Deferred to colonoscopy Musculoskeletal: Symmetrical with no gross deformities  Skin: No lesions on visible extremities Pulses:  Normal pulses noted Extremities: No clubbing, cyanosis, edema or deformities noted Neurological: Alert oriented x 4, grossly nonfocal Cervical Nodes:  No significant cervical adenopathy Inguinal Nodes: No significant inguinal adenopathy Psychological:  Alert and cooperative. Normal mood and affect  Assessment and Recommendations:  1. Small volume hematochezia,  mild constipation, personal history of 1 small adenomatous colon polyp in 2006. Schedule colonoscopy. The risks (including bleeding, perforation, infection, missed lesions, medication reactions and possible hospitalization or surgery if complications occur), benefits, and alternatives to colonoscopy with possible biopsy and possible polypectomy were discussed with the patient and they consent to proceed.   2. Hold Eliquis 5 days before procedure - will instruct when and how to resume after procedure. Low but real risk of cardiovascular event such as heart attack, stroke, embolism, thrombosis or ischemia/infarct of other organs off Eliquis explained and need to seek urgent help if this occurs. The patient consents to proceed. Will communicate by phone or EMR with patient's prescribing provider to confirm that holding Eliquis is reasonable in this case.

## 2015-07-16 NOTE — Patient Instructions (Signed)
You have been scheduled for a colonoscopy. Please follow written instructions given to you at your visit today.  Please pick up your prep supplies at the pharmacy within the next 1-3 days. If you use inhalers (even only as needed), please bring them with you on the day of your procedure. Your physician has requested that you go to www.startemmi.com and enter the access code given to you at your visit today. This web site gives a general overview about your procedure. However, you should still follow specific instructions given to you by our office regarding your preparation for the procedure.  Thank you for choosing me and [facility].  Malcolm T. Stark, Jr., MD., FACG  

## 2015-07-16 NOTE — Telephone Encounter (Signed)
OK to be of Eliquis for 3 days prior to procedure

## 2015-07-16 NOTE — Telephone Encounter (Signed)
Per Dr. Radford Pax and Dr. Fuller Plan patient can hold Eliquis 2 days prior to his procedure. Left a message for patient to return my call.

## 2015-07-16 NOTE — Telephone Encounter (Signed)
Routed to [facility], [place].

## 2015-07-17 NOTE — Telephone Encounter (Signed)
Patient notified to hold Eliquis 2 days prior to his procedure. Patient verbalized understanding.

## 2015-07-19 ENCOUNTER — Other Ambulatory Visit: Payer: Self-pay | Admitting: Internal Medicine

## 2015-07-19 NOTE — Telephone Encounter (Signed)
i am refilling bp meds---please advise on acyclovir rx request, thanks

## 2015-07-19 NOTE — Telephone Encounter (Signed)
Ok to fill all?  

## 2015-07-31 ENCOUNTER — Encounter (HOSPITAL_COMMUNITY): Payer: Self-pay | Admitting: Nurse Practitioner

## 2015-07-31 ENCOUNTER — Ambulatory Visit (HOSPITAL_COMMUNITY)
Admission: RE | Admit: 2015-07-31 | Discharge: 2015-07-31 | Disposition: A | Discharge status: Home / Self Care | Payer: Medicare Other | Source: Ambulatory Visit | Attending: Nurse Practitioner | Admitting: Nurse Practitioner

## 2015-07-31 VITALS — BP 140/82 | HR 54 | Ht 67.5 in | Wt 195.8 lb

## 2015-07-31 DIAGNOSIS — Z7901 Long term (current) use of anticoagulants: Secondary | ICD-10-CM | POA: Insufficient documentation

## 2015-07-31 DIAGNOSIS — Z0189 Encounter for other specified special examinations: Secondary | ICD-10-CM | POA: Insufficient documentation

## 2015-07-31 DIAGNOSIS — Z96649 Presence of unspecified artificial hip joint: Secondary | ICD-10-CM | POA: Insufficient documentation

## 2015-07-31 DIAGNOSIS — I481 Persistent atrial fibrillation: Secondary | ICD-10-CM | POA: Diagnosis not present

## 2015-07-31 DIAGNOSIS — K759 Inflammatory liver disease, unspecified: Secondary | ICD-10-CM | POA: Diagnosis not present

## 2015-07-31 DIAGNOSIS — I10 Essential (primary) hypertension: Secondary | ICD-10-CM | POA: Insufficient documentation

## 2015-07-31 DIAGNOSIS — D126 Benign neoplasm of colon, unspecified: Secondary | ICD-10-CM | POA: Diagnosis not present

## 2015-07-31 DIAGNOSIS — E785 Hyperlipidemia, unspecified: Secondary | ICD-10-CM | POA: Diagnosis not present

## 2015-07-31 DIAGNOSIS — G4733 Obstructive sleep apnea (adult) (pediatric): Secondary | ICD-10-CM | POA: Insufficient documentation

## 2015-07-31 DIAGNOSIS — M1990 Unspecified osteoarthritis, unspecified site: Secondary | ICD-10-CM | POA: Diagnosis not present

## 2015-07-31 DIAGNOSIS — I480 Paroxysmal atrial fibrillation: Secondary | ICD-10-CM | POA: Diagnosis not present

## 2015-07-31 LAB — BASIC METABOLIC PANEL
ANION GAP: 9 (ref 5–15)
BUN: 21 mg/dL — ABNORMAL HIGH (ref 6–20)
CALCIUM: 9.4 mg/dL (ref 8.9–10.3)
CO2: 26 mmol/L (ref 22–32)
CREATININE: 1.1 mg/dL (ref 0.61–1.24)
Chloride: 102 mmol/L (ref 101–111)
GFR calc non Af Amer: >60 mL/min (ref >60)
Glucose, Bld: 97 mg/dL (ref 65–99)
Potassium: 4.4 mmol/L (ref 3.5–5.1)
SODIUM: 137 mmol/L (ref 135–145)

## 2015-07-31 LAB — MAGNESIUM: MAGNESIUM: 2.1 mg/dL (ref 1.7–2.4)

## 2015-07-31 MED ORDER — DOFETILIDE 500 MCG PO CAPS: 500.0000 ug | ORAL_CAPSULE | Freq: Two times a day (BID) | ORAL | Status: DC

## 2015-07-31 NOTE — Progress Notes (Signed)
Patient ID: Daniel Tucker, male   DOB: [birth year], 75 y.o.   MRN: [id number]     Primary Care Physician: Binnie Rail, MD Referring Physician: Dr. Jovita Kussmaul is a 75 y.o. male with a h/o persistent  afib maintaining SR on tikosyn. He reports that he feels well.No issues with blood thinner. Not aware of any afib. Using mouth piece for sleep apnea followed thru Dr.[place].  Today, he denies symptoms of palpitations, chest pain, shortness of breath, orthopnea, PND, lower extremity edema, dizziness, presyncope, syncope, or neurologic sequela. The patient is tolerating medications without difficulties and is otherwise without complaint today.   Past Medical History  Diagnosis Date  . Tubular adenoma of colon 2006    Dr Fuller Plan  . Unspecified essential hypertension   . Fasting hyperglycemia 2006    FBS 111; normal A1c  . Hyperlipidemia   . Atrial fibrillation ([place]) 08/24/2014    a. 08/2014 Echo: EF 55-60%, no rwma, mild MR, mod-sev dil LA, mod dil RA, PASP 29mHg;  b. 09/2014 DCCV ->Return to afib after 3 days;  b. CHA2DS2VASc = 1-->Eliquis.  . Sleep apnea     "had OR; thought it was gone; having another sleep study 12/2014" (11/20/2014)  . Hepatitis ~ 1955    "yellow juandice"  . Arthritis     "hips" (11/20/2014)   Past Surgical History  Procedure Laterality Date  . Uvulopalatopharyngoplasty  1997    Dr.Woliki  . Colonoscopy w/ polypectomy  2006    [facility]  . Colonoscopy  2011    no polyp  . Prostate biopsy  2010    Negative,Dr.Ottelin  . 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 Cardioversion N/A 10/12/2014    Procedure: CARDIOVERSION;  Surgeon: TDarlin Coco MD;  [facility];  Service: Cardiovascular;  Laterality: N/A;  . Tonsillectomy and adenoidectomy  1997  . Nasal sinus surgery  1997    Current Outpatient Prescriptions  Medication Sig Dispense Refill  . acetaminophen (TYLENOL) 500 MG chewable tablet Chew 500 mg by mouth every 6 (six)  hours as needed for pain.    .Marland Kitchenacyclovir (ZOVIRAX) 200 MG capsule TAKE 2 CAPSULES EVERY DAY AS DIRECTED 180 capsule 3  . amLODipine (NORVASC) 5 MG tablet TAKE 1 TABLET EVERY DAY 90 tablet 3  . apixaban (ELIQUIS) 5 MG TABS tablet Take 1 tablet (5 mg total) by mouth 2 (two) times daily. 180 tablet 3  . Ascorbic Acid (VITAMIN C) 1000 MG tablet Take 1,000 mg by mouth daily.      .Marland Kitchendofetilide (TIKOSYN) 500 MCG capsule Take 1 capsule (500 mcg total) by mouth 2 (two) times daily. 180 capsule 3  . GLUCOSAMINE SULFATE PO Take 2 tablets by mouth daily. Will start back med in January 2017    . metoprolol (LOPRESSOR) 50 MG tablet Take 1 tablet (50 mg total) by mouth 2 (two) times daily. 180 tablet 3  . Multiple Vitamin (MULTIVITAMIN) tablet Take 1 tablet by mouth daily.     . Multiple Vitamins-Minerals (PRESERVISION AREDS 2 PO) Take 1 tablet by mouth 2 (two) times daily.    . Na Sulfate-K Sulfate-Mg Sulf SOLN Take 1 kit by mouth once. 354 mL 0  . potassium chloride SA (K-DUR,KLOR-CON) 20 MEQ tablet Take 1 tablet (20 mEq total) by mouth 4 (four) times daily. 360 tablet 3  . pravastatin (PRAVACHOL) 20 MG tablet TAKE 1 TABLET AT BEDTIME 90 tablet 3   No current facility-administered  medications for this encounter.    No Known Allergies  Social History   Social History  . Marital Status: Married    Spouse Name: N/A  . Number of Children: N/A  . Years of Education: N/A   Occupational History  . Not on file.   Social History Main Topics  . Smoking status: Former Smoker -- 1.00 packs/day for 7 years    Types: Cigarettes  . Smokeless tobacco: Never Used     Comment: smoked cigarettes age [age], up to 1ppd  . Alcohol Use: 1.2 oz/week    1 Glasses of wine, 1 Cans of beer per week  . Drug Use: No  . Sexual Activity: Yes   Other Topics Concern  . Not on file   Social History Narrative    Family History  Problem Relation Age of Onset  . Prostate cancer Father   . Hypertension Father   .  Hypertension Mother   . Prostate cancer Brother   . Diabetes Neg Hx   . Heart disease Neg Hx   . Stroke Neg Hx   . Melanoma Sister   . Atrial fibrillation Brother   . Melanoma Brother   . Lupus Sister     ROS- All systems are reviewed and negative except as per the HPI above  Physical Exam: Filed Vitals:   07/31/15 1324  BP: 140/82  Pulse: 54  Height: 5' 7.5" (1.715 m)  Weight: 195 lb 12.8 oz (88.814 kg)    GEN- The patient is well appearing, alert and oriented x 3 today.   Head- normocephalic, atraumatic Eyes-  Sclera clear, conjunctiva pink Ears- hearing intact Oropharynx- clear Neck- supple, no JVP Lymph- no cervical lymphadenopathy Lungs- Clear to ausculation bilaterally, normal work of breathing Heart- Regular rate and rhythm, no murmurs, rubs or gallops, PMI not laterally displaced GI- soft, NT, ND, + BS Extremities- no clubbing, cyanosis, or edema MS- no significant deformity or atrophy Skin- no rash or lesion Psych- euthymic mood, full affect Neuro- strength and sensation are intact  EKG- Sinus brady with RBBB, LAD, pr int 158 ms, qrs int 134 ms, qtc 462 ms.  Epic records reviewed.  Assessment and Plan: 1. afib Continue on tikosyn 500 mg bid, qtc stable Continue apixaban, no bleeding issues Bmet/mag  2.OSA F/u with Dr. Radford Pax as scheduled   3. HTN  Stable  F/u in 3 months in afib clinic  Butch [facility], [facility] [address] [place], [place] [address] [phone]

## 2015-08-02 ENCOUNTER — Ambulatory Visit (AMBULATORY_SURGERY_CENTER): Payer: Medicare Other | Admitting: Gastroenterology

## 2015-08-02 ENCOUNTER — Encounter: Payer: Self-pay | Admitting: Gastroenterology

## 2015-08-02 VITALS — BP 115/82 | HR 51 | Temp 97.5°F | Resp 11 | Ht 67.5 in | Wt 197.0 lb

## 2015-08-02 DIAGNOSIS — I10 Essential (primary) hypertension: Secondary | ICD-10-CM | POA: Diagnosis not present

## 2015-08-02 DIAGNOSIS — Z86010 Personal history of colonic polyps: Secondary | ICD-10-CM | POA: Diagnosis not present

## 2015-08-02 DIAGNOSIS — E669 Obesity, unspecified: Secondary | ICD-10-CM | POA: Diagnosis not present

## 2015-08-02 DIAGNOSIS — K921 Melena: Secondary | ICD-10-CM | POA: Diagnosis not present

## 2015-08-02 DIAGNOSIS — I4891 Unspecified atrial fibrillation: Secondary | ICD-10-CM | POA: Diagnosis not present

## 2015-08-02 DIAGNOSIS — D128 Benign neoplasm of rectum: Secondary | ICD-10-CM | POA: Diagnosis not present

## 2015-08-02 DIAGNOSIS — G4733 Obstructive sleep apnea (adult) (pediatric): Secondary | ICD-10-CM | POA: Diagnosis not present

## 2015-08-02 MED ORDER — SODIUM CHLORIDE 0.9 % IV SOLN: 500.0000 mL | INTRAVENOUS | Status: DC

## 2015-08-02 NOTE — Progress Notes (Signed)
Called to room to assist during endoscopic procedure.  Patient ID and intended procedure confirmed with present staff. Received instructions for my participation in the procedure from the performing physician.  

## 2015-08-02 NOTE — Progress Notes (Signed)
To recovery, report to Tyrell, RN, VSS. 

## 2015-08-02 NOTE — Op Note (Signed)
[facility] Patient Name: Daniel Tucker Procedure Date: 08/02/2015 10:37 AM MRN: [id number] Endoscopist: Ladene Artist , MD Age: 75 Date of Birth: 1940-09-03 Gender: Male Procedure:                Colonoscopy Indications:              Hematochezia Medicines:                Monitored Anesthesia Care Procedure:                Pre-Anesthesia Assessment:                           - Prior to the procedure, a History and Physical                            was performed, and patient medications and                            allergies were reviewed. The patient's tolerance of                            previous anesthesia was also reviewed. The risks                            and benefits of the procedure and the sedation                            options and risks were discussed with the patient.                            All questions were answered, and informed consent                            was obtained. Prior Anticoagulants: The patient has                            taken Eliquis (apixaban), last dose was 2 days                            prior to procedure. ASA Grade Assessment: III - A                            patient with severe systemic disease. After                            reviewing the risks and benefits, the patient was                            deemed in satisfactory condition to undergo the                            procedure.                           After obtaining  informed consent, the colonoscope                            was passed under direct vision. Throughout the                            procedure, the patient's blood pressure, pulse, and                            oxygen saturations were monitored continuously. The                            Model PCF-[id number]) scope was introduced                            through the anus and advanced to the the cecum,                            identified by appendiceal orifice and  ileocecal                            valve. The ileocecal valve, appendiceal orifice,                            and rectum were photographed. The quality of the                            bowel preparation was good. The colonoscopy was                            performed without difficulty. The patient tolerated                            the procedure well. Scope In: 10:53:05 AM Scope Out: 11:04:58 AM Scope Withdrawal Time: 0 hours 10 minutes 56 seconds  Total Procedure Duration: 0 hours 11 minutes 53 seconds  Findings:                 A 6 mm polyp was found in the rectum. The polyp was                            sessile. The polyp was removed with a cold snare.                            Resection and retrieval were complete.                           Internal hemorrhoids were found during                            retroflexion. The hemorrhoids were small and Grade                            I (internal hemorrhoids that do not prolapse).  The exam was otherwise without abnormality on                            direct and retroflexion views.                           Scattered small-mouthed diverticula were found in                            the sigmoid colon. Complications:            No immediate complications. Estimated blood loss:                            None. Estimated Blood Loss:     Estimated blood loss: none. Impression:               - One 6 mm polyp in the rectum, removed with a cold                            snare. Resected and retrieved.                           - Internal hemorrhoids.                           - Mild sigmoid diverticulosis Recommendation:           - Repeat colonoscopy in 5 years for surveillance.                           - Resume Eliquis (apixaban) in 2 days at prior                            dose. Refer to managing physician for further                            adjustment of therapy.                           -  Patient has a contact number available for                            emergencies. The signs and symptoms of potential                            delayed complications were discussed with the                            patient. Return to normal activities tomorrow.                            Written discharge instructions were provided to the                            patient.                           -  Resume previous diet.                           - Continue present medications.                           - Await pathology results.                           - Preparation H suppository: Insert rectally as                            necessary. Ladene Artist, MD 08/02/2015 11:11:27 AM This report has been signed electronically.

## 2015-08-02 NOTE — Patient Instructions (Signed)
Resume your Eliquis on Saturday, April 15 on your evening dose.  You may use Preparation H for your Hemorrhoids.      YOU HAD AN ENDOSCOPIC PROCEDURE TODAY AT [facility]:   Refer to the procedure report that was given to you for any specific questions about what was found during the examination.  If the procedure report does not answer your questions, please call your gastroenterologist to clarify.  If you requested that your care partner not be given the details of your procedure findings, then the procedure report has been included in a sealed envelope for you to review at your convenience later.  YOU SHOULD EXPECT: Some feelings of bloating in the abdomen. Passage of more gas than usual.  Walking can help get rid of the air that was put into your GI tract during the procedure and reduce the bloating. If you had a lower endoscopy (such as a colonoscopy or flexible sigmoidoscopy) you may notice spotting of blood in your stool or on the toilet paper. If you underwent a bowel prep for your procedure, you may not have a normal bowel movement for a few days.  Please Note:  You might notice some irritation and congestion in your nose or some drainage.  This is from the oxygen used during your procedure.  There is no need for concern and it should clear up in a day or so.  SYMPTOMS TO REPORT IMMEDIATELY:   Following lower endoscopy (colonoscopy or flexible sigmoidoscopy):  Excessive amounts of blood in the stool  Significant tenderness or worsening of abdominal pains  Swelling of the abdomen that is new, acute  Fever of 100F or higher   For urgent or emergent issues, a gastroenterologist can be reached at any hour by calling [phone].   DIET: Your first meal following the procedure should be a small meal and then it is ok to progress to your normal diet. Heavy or fried foods are harder to digest and may make you feel nauseous or bloated.  Likewise, meals heavy in dairy  and vegetables can increase bloating.  Drink plenty of fluids but you should avoid alcoholic beverages for 24 hours.  ACTIVITY:  You should plan to take it easy for the rest of today and you should NOT DRIVE or use heavy machinery until tomorrow (because of the sedation medicines used during the test).    FOLLOW UP: Our staff will call the number listed on your records the next business day following your procedure to check on you and address any questions or concerns that you may have regarding the information given to you following your procedure. If we do not reach you, we will leave a message.  However, if you are feeling well and you are not experiencing any problems, there is no need to return our call.  We will assume that you have returned to your regular daily activities without incident.  If any biopsies were taken you will be contacted by phone or by letter within the next 1-3 weeks.  Please call us at [phone] if you have not heard about the biopsies in 3 weeks.    SIGNATURES/CONFIDENTIALITY: You and/or your care partner have signed paperwork which will be entered into your electronic medical record.  These signatures attest to the fact that that the information above on your After Visit Summary has been reviewed and is understood.  Full responsibility of the confidentiality of this discharge information lies with you and/or your care-partner.

## 2015-08-06 ENCOUNTER — Telehealth: Payer: Self-pay | Admitting: *Deleted

## 2015-08-06 NOTE — Telephone Encounter (Signed)
  Follow up Call-  Call back number 08/02/2015  Post procedure Call Back phone  # [phone]  Permission to leave phone message Yes     Patient questions:  Do you have a fever, pain , or abdominal swelling? No. Pain Score  0 *  Have you tolerated food without any problems? Yes.    Have you been able to return to your normal activities? Yes.    Do you have any questions about your discharge instructions: Diet   No. Medications  No. Follow up visit  No.  Do you have questions or concerns about your Care? No.  Actions: * If pain score is 4 or above: No action needed, pain <4.

## 2015-08-13 ENCOUNTER — Encounter: Payer: Self-pay | Admitting: Gastroenterology

## 2015-08-15 ENCOUNTER — Telehealth (HOSPITAL_COMMUNITY): Payer: Self-pay | Admitting: *Deleted

## 2015-08-15 NOTE — Telephone Encounter (Signed)
Patient called in stating he has noticed over last few days his HR has been in the 52-57 range and feeling sluggish. No shortness of breath or dizziness noted. Wondering if he should be concerned with this.  Questioned if patient could possibly be dehydrated as HR was 54 at last office visit and he did not feel sluggish at that time.  Patient states he possibly could be dehydrated and will try increasing fluid intake over next few days and will see if improvement with symptoms.  Also told patient he could try cutting AM dose of metoprolol in half if increasing water intake did not help and see if this alleviates his symptoms. Patient fears he may go back into afib if he decreases his metoprolol but may try if symptoms continue. He will call back if he has further issues.

## 2015-09-05 ENCOUNTER — Ambulatory Visit (INDEPENDENT_AMBULATORY_CARE_PROVIDER_SITE_OTHER): Payer: Medicare Other | Admitting: Internal Medicine

## 2015-09-05 ENCOUNTER — Other Ambulatory Visit (INDEPENDENT_AMBULATORY_CARE_PROVIDER_SITE_OTHER): Payer: Medicare Other

## 2015-09-05 ENCOUNTER — Encounter: Payer: Self-pay | Admitting: Internal Medicine

## 2015-09-05 VITALS — BP 138/84 | HR 52 | Temp 98.6°F | Resp 16 | Ht 67.5 in | Wt 193.0 lb

## 2015-09-05 DIAGNOSIS — Z0000 Encounter for general adult medical examination without abnormal findings: Secondary | ICD-10-CM

## 2015-09-05 DIAGNOSIS — E785 Hyperlipidemia, unspecified: Secondary | ICD-10-CM

## 2015-09-05 DIAGNOSIS — R7303 Prediabetes: Secondary | ICD-10-CM | POA: Diagnosis not present

## 2015-09-05 DIAGNOSIS — Z125 Encounter for screening for malignant neoplasm of prostate: Secondary | ICD-10-CM

## 2015-09-05 DIAGNOSIS — I480 Paroxysmal atrial fibrillation: Secondary | ICD-10-CM | POA: Diagnosis not present

## 2015-09-05 DIAGNOSIS — N402 Nodular prostate without lower urinary tract symptoms: Secondary | ICD-10-CM | POA: Diagnosis not present

## 2015-09-05 DIAGNOSIS — I10 Essential (primary) hypertension: Secondary | ICD-10-CM

## 2015-09-05 DIAGNOSIS — B002 Herpesviral gingivostomatitis and pharyngotonsillitis: Secondary | ICD-10-CM | POA: Diagnosis not present

## 2015-09-05 LAB — COMPREHENSIVE METABOLIC PANEL
ALBUMIN: 4.5 g/dL (ref 3.5–5.2)
ALT: 16 U/L (ref 0–53)
AST: 19 U/L (ref 0–37)
Alkaline Phosphatase: 74 U/L (ref 39–117)
BILIRUBIN TOTAL: 0.8 mg/dL (ref 0.2–1.2)
BUN: 22 mg/dL (ref 6–23)
CALCIUM: 9.4 mg/dL (ref 8.4–10.5)
CO2: 30 mEq/L (ref 19–32)
CREATININE: 1.03 mg/dL (ref 0.40–1.50)
Chloride: 103 mEq/L (ref 96–112)
GFR: 74.98 mL/min (ref >60.00)
Glucose, Bld: 114 mg/dL — ABNORMAL HIGH (ref 70–99)
Potassium: 4.4 mEq/L (ref 3.5–5.1)
SODIUM: 138 meq/L (ref 135–145)
TOTAL PROTEIN: 7 g/dL (ref 6.0–8.3)

## 2015-09-05 LAB — LIPID PANEL
CHOLESTEROL: 150 mg/dL (ref 0–200)
HDL: 37.8 mg/dL — ABNORMAL LOW (ref >39.00)
LDL CALC: 92 mg/dL (ref 0–99)
NonHDL: 111.91
TRIGLYCERIDES: 102 mg/dL (ref 0.0–149.0)
Total CHOL/HDL Ratio: 4
VLDL: 20.4 mg/dL (ref 0.0–40.0)

## 2015-09-05 LAB — CBC WITH DIFFERENTIAL/PLATELET
BASOS ABS: 0 10*3/uL (ref 0.0–0.1)
BASOS PCT: 0.2 % (ref 0.0–3.0)
EOS ABS: 0.2 10*3/uL (ref 0.0–0.7)
Eosinophils Relative: 3.4 % (ref 0.0–5.0)
HEMATOCRIT: 42.8 % (ref 39.0–52.0)
HEMOGLOBIN: 14.6 g/dL (ref 13.0–17.0)
LYMPHS PCT: 25.7 % (ref 12.0–46.0)
Lymphs Abs: 1.7 10*3/uL (ref 0.7–4.0)
MCHC: 34.1 g/dL (ref 30.0–36.0)
MCV: 95.5 fl (ref 78.0–100.0)
Monocytes Absolute: 0.7 10*3/uL (ref 0.1–1.0)
Monocytes Relative: 11 % (ref 3.0–12.0)
Neutro Abs: 3.8 10*3/uL (ref 1.4–7.7)
Neutrophils Relative %: 59.7 % (ref 43.0–77.0)
Platelets: 217 10*3/uL (ref 150.0–400.0)
RBC: 4.48 Mil/uL (ref 4.22–5.81)
RDW: 13.1 % (ref 11.5–15.5)
WBC: 6.4 10*3/uL (ref 4.0–10.5)

## 2015-09-05 LAB — PSA, MEDICARE: PSA: 0.43 ng/mL (ref 0.10–4.00)

## 2015-09-05 LAB — HEMOGLOBIN A1C: HEMOGLOBIN A1C: 6 % (ref 4.6–6.5)

## 2015-09-05 LAB — TSH: TSH: 1.44 u[IU]/mL (ref 0.35–4.50)

## 2015-09-05 MED ORDER — ACYCLOVIR 200 MG PO CAPS: 400.0000 mg | ORAL_CAPSULE | Freq: Two times a day (BID) | ORAL | Status: DC

## 2015-09-05 NOTE — Patient Instructions (Addendum)
  Daniel Tucker , Thank you for taking time to come for your Medicare Wellness Visit. I appreciate your ongoing commitment to your health goals. Please review the following plan we discussed and let me know if I can assist you in the future.   These are the goals we discussed: Goals    None      This is a list of the screening recommended for you and due dates:  Health Maintenance  Topic Date Due  . Shingles Vaccine  deferred  . Flu Shot  11/20/2015  . Tetanus Vaccine  11/09/2019  . Colon Cancer Screening  08/01/2020  . Pneumonia vaccines  Completed    Test(s) ordered today. Your results will be released to [place] (or called to you) after review, usually within 72hours after test completion. If any changes need to be made, you will be notified at that same time.  All other Health Maintenance issues reviewed.   All recommended immunizations and age-appropriate screenings are up-to-date or discussed.  No immunizations administered today.   Medications reviewed and updated.  No changes recommended at this time.   Please followup in one year

## 2015-09-05 NOTE — Assessment & Plan Note (Signed)
On pravastatin Check lipid panel

## 2015-09-05 NOTE — Assessment & Plan Note (Signed)
Having outbreaks once a month on current dose of 200 mg twice daily Will increase to 400 mg twice daily then decrease in 6 -12 months

## 2015-09-05 NOTE — Progress Notes (Signed)
Subjective:    Patient ID: Daniel Tucker, male    DOB: Jul 30, 1940, 75 y.o.   MRN: [id number]  HPI Here for medicare wellness exam.   I have personally reviewed and have noted 1.The patient's medical and social history 2.Their use of alcohol, tobacco or illicit drugs 3.Their current medications and supplements 4.The patient's functional ability including ADL's, fall risks, home safety risks and                 hearing or visual impairment. 5.Diet and physical activities 6.Evidence for depression or mood disorders 7.Care team reviewed and updated - Dr Fuller Plan GI, Dr Radford Pax Cardiology   Are there smokers in your home (other than you)? 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  Depression Screen  Have you felt down, depressed or hopeless? No  Have you felt little interest or pleasure in doing things?  No  Activities of Daily Living In your present state of health, do you have any difficulty performing the following activities?:  Driving? No Managing money?  No Feeding yourself? No Getting from bed to chair? No Climbing a flight of stairs? No Preparing food and eating?: No Bathing or showering? No Getting dressed: No Getting to/using the toilet? No Moving around from place to place: No In the past year have you fallen or had a near fall?: yes, ladder fell out from under him, no injuries, no balance problems   Are you sexually active?  Yes, minimally  Do you have more than one partner?  N/A  Hearing Difficulties: No Do you often ask people to speak up or repeat themselves? No Do you experience ringing or noises in your ears? No Do you have difficulty understanding soft or whispered voices? No Vision:                Any change in vision: no              Up to date with eye exam: yes Memory:  Do you feel that you have a problem with memory? No  Do you often misplace items? No  Do you feel safe at home?  Yes  Cognitive Testing  Alert, Orientated? Yes  Normal Appearance? Yes  Recall of three objects?  Yes  Can perform simple calculations? Yes  Displays appropriate judgment? Yes  Can read the correct time from a watch face? Yes   Advanced Directives have been discussed with the patient? Yes  Medications and allergies reviewed with patient and updated if appropriate.  Patient Active Problem List   Diagnosis Date Noted  . Prediabetes 09/05/2015  . Arthritis of right hip 04/02/2015  . Atrial fibrillation ([place]) 08/24/2014  . Obstructive sleep apnea 07/28/2012  . Nonspecific abnormal electrocardiogram (ECG) (EKG) 05/15/2011  . Hyperlipidemia 10/10/2008  . Essential hypertension 10/10/2008  . History of colonic polyps 10/10/2008  . 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 acetaminophen (TYLENOL) 500 MG chewable tablet Chew 500 mg by mouth every 6 (six) hours as needed for pain.    Marland Kitchen acyclovir (ZOVIRAX) 200 MG capsule TAKE 2 CAPSULES EVERY DAY AS DIRECTED 180 capsule 3  . amLODipine (NORVASC) 5 MG tablet TAKE 1 TABLET EVERY DAY 90 tablet 3  .  apixaban (ELIQUIS) 5 MG TABS tablet Take 1 tablet (5 mg total) by mouth 2 (two) times daily. 180 tablet 3  . Ascorbic Acid (VITAMIN C) 1000 MG tablet Take 1,000 mg by mouth daily.      Marland Kitchen dofetilide (TIKOSYN) 500 MCG capsule Take 1 capsule (500 mcg total) by mouth 2 (two) times daily. 180 capsule 3  . GLUCOSAMINE SULFATE PO Take 2 tablets by mouth daily. Will start back med in January 2017    . metoprolol (LOPRESSOR) 50 MG tablet Take 1 tablet (50 mg total) by mouth 2 (two) times daily. 180 tablet 3  . Multiple Vitamin (MULTIVITAMIN) tablet Take 1  tablet by mouth daily.     . Multiple Vitamins-Minerals (PRESERVISION AREDS 2 PO) Take 1 tablet by mouth 2 (two) times daily.    . potassium chloride SA (K-DUR,KLOR-CON) 20 MEQ tablet Take 1 tablet (20 mEq total) by mouth 4 (four) times daily. 360 tablet 3  . pravastatin (PRAVACHOL) 20 MG tablet TAKE 1 TABLET AT BEDTIME 90 tablet 3   No current facility-administered medications on file prior to visit.    Past Medical History  Diagnosis Date  . Tubular adenoma of colon 2006    Dr Fuller Plan  . Unspecified essential hypertension   . Fasting hyperglycemia 2006    FBS 111; normal A1c  . Hyperlipidemia   . Atrial fibrillation ([place]) 08/24/2014    a. 08/2014 Echo: EF 55-60%, no rwma, mild MR, mod-sev dil LA, mod dil RA, PASP 54mmHg;  b. 09/2014 DCCV ->Return to afib after 3 days;  b. CHA2DS2VASc = 1-->Eliquis.  . Sleep apnea     "had OR; thought it was gone; having another sleep study 12/2014" (11/20/2014)  . Hepatitis ~ 1955    "yellow juandice"  . Arthritis     "hips" (11/20/2014)    Past Surgical History  Procedure Laterality Date  . Uvulopalatopharyngoplasty  1997    Dr.Woliki  . Colonoscopy w/ polypectomy  2006    [place]  . Colonoscopy  2011    no polyp  . Prostate biopsy  2010    Negative,Dr.Ottelin  . 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 Cardioversion N/A 10/12/2014    Procedure: CARDIOVERSION;  Surgeon: Darlin Coco, MD;  Location: [facility] ENDOSCOPY;  Service: Cardiovascular;  Laterality: N/A;  . Tonsillectomy and adenoidectomy  1997  . Nasal sinus surgery  1997    Social History   Social History  . Marital Status: Married    Spouse Name: N/A  . Number of Children: N/A  . Years of Education: N/A   Social History Main Topics  . Smoking status: Former Smoker -- 1.00 packs/day for 7 years    Types: Cigarettes  . Smokeless tobacco: Never Used     Comment: smoked cigarettes age [age], up to 1ppd  . Alcohol Use: 1.2 oz/week    1 Glasses of wine, 1 Cans of beer per  week     Comment: less than 1 drink per week  . Drug Use: No  . Sexual Activity: Yes   Other Topics Concern  . Not on file   Social History Narrative    Family History  Problem Relation Age of Onset  . Prostate cancer Father   . Hypertension Father   . Hypertension Mother   . Prostate cancer Brother   . Diabetes Neg Hx   . Heart disease Neg Hx   . Stroke Neg Hx   . Colon cancer Neg Hx   .  Melanoma Sister   . Atrial fibrillation Brother   . Melanoma Brother   . Lupus Sister     Review of Systems  Constitutional: Negative for fever, chills, appetite change and fatigue.  HENT: Negative for hearing loss and tinnitus.   Eyes: Negative for visual disturbance.  Respiratory: Negative for cough, shortness of breath and wheezing.   Cardiovascular: Negative for chest pain, palpitations and leg swelling.  Gastrointestinal: Negative for nausea, abdominal pain, diarrhea, constipation and blood in stool.       No gerd  Genitourinary: Negative for dysuria, hematuria and difficulty urinating.  Musculoskeletal: Positive for back pain (lower back pain) and arthralgias (hip pain, finger pain, right shoulder).  Skin: Negative for color change and rash.  Neurological: Negative for dizziness, light-headedness and headaches.  Psychiatric/Behavioral: Negative for sleep disturbance and dysphoric mood. The patient is not nervous/anxious.        Objective:   Filed Vitals:   09/05/15 0758  BP: 138/84  Pulse: 52  Temp: 98.6 F (37 C)  Resp: 16   Filed Weights   09/05/15 0758  Weight: 193 lb (87.544 kg)   Body mass index is 29.76 kg/(m^2).   Physical Exam Constitutional: He appears well-developed and well-nourished. No distress.  HENT:  Head: Normocephalic and atraumatic.  Right Ear: External ear normal.  Left Ear: External ear normal.  Mouth/Throat: Oropharynx is clear and moist.  Normal ear canals and TM b/l  Eyes: Conjunctivae and EOM are normal.  Neck: Neck supple. No  tracheal deviation present. No thyromegaly present.  No carotid bruit  Cardiovascular: Normal rate, regular rhythm, normal heart sounds and intact distal pulses.   No murmur heard. Pulmonary/Chest: Effort normal and breath sounds normal. No respiratory distress. He has no wheezes. He has no rales.  Abdominal: Soft. Bowel sounds are normal. He exhibits no distension. There is no tenderness.  Genitourinary: prostate  nodule on left - ? unchanged - has been there, no other nodules, not enlarged Musculoskeletal: He exhibits no edema.  Lymphadenopathy:    He has no cervical adenopathy.  Skin: Skin is warm and dry. He is not diaphoretic.  Psychiatric: He has a normal mood and affect. His behavior is normal.       Assessment & Plan:    Wellness Exam: Immunizations: shingles vaccine discussed - declined due to wife being immunocompromised, other vaccines up to date Colonoscopy - up to date Eye exam  - Up to date  Hearing loss  - no concerns regarding hearing Memory concerns/difficulties -- none Independent of ADLs - fully independent   Patient received copy of preventative screening tests/immunizations recommended for the next 5-10 years.   See Problem List for Assessment and Plan of chronic medical problems.  Follow up in one year

## 2015-09-05 NOTE — Assessment & Plan Note (Signed)
Exam unchanged Check psa

## 2015-09-05 NOTE — Progress Notes (Signed)
Pre visit review using our clinic review tool, if applicable. No additional management support is needed unless otherwise documented below in the visit note. 

## 2015-09-05 NOTE — Assessment & Plan Note (Signed)
BP well controlled Current regimen effective and well tolerated Continue current medications at current doses  

## 2015-09-05 NOTE — Assessment & Plan Note (Signed)
Check a1c Advised low sugar/carb diet Regular exercise

## 2015-09-05 NOTE — Assessment & Plan Note (Signed)
Asymptomatic, rate controlled Following with cardiology Continue current meds

## 2015-09-07 DIAGNOSIS — M79645 Pain in left finger(s): Secondary | ICD-10-CM | POA: Diagnosis not present

## 2015-10-30 ENCOUNTER — Ambulatory Visit (HOSPITAL_COMMUNITY)
Admission: RE | Admit: 2015-10-30 | Discharge: 2015-10-30 | Disposition: A | Discharge status: Home / Self Care | Payer: Medicare Other | Source: Ambulatory Visit | Attending: Nurse Practitioner | Admitting: Nurse Practitioner

## 2015-10-30 ENCOUNTER — Encounter (HOSPITAL_COMMUNITY): Payer: Self-pay | Admitting: Nurse Practitioner

## 2015-10-30 VITALS — BP 156/90 | HR 49 | Ht 67.5 in | Wt 190.2 lb

## 2015-10-30 DIAGNOSIS — Z79899 Other long term (current) drug therapy: Secondary | ICD-10-CM | POA: Insufficient documentation

## 2015-10-30 DIAGNOSIS — Z8249 Family history of ischemic heart disease and other diseases of the circulatory system: Secondary | ICD-10-CM | POA: Diagnosis not present

## 2015-10-30 DIAGNOSIS — Z7901 Long term (current) use of anticoagulants: Secondary | ICD-10-CM | POA: Diagnosis not present

## 2015-10-30 DIAGNOSIS — E785 Hyperlipidemia, unspecified: Secondary | ICD-10-CM | POA: Insufficient documentation

## 2015-10-30 DIAGNOSIS — G4733 Obstructive sleep apnea (adult) (pediatric): Secondary | ICD-10-CM | POA: Diagnosis not present

## 2015-10-30 DIAGNOSIS — I10 Essential (primary) hypertension: Secondary | ICD-10-CM | POA: Insufficient documentation

## 2015-10-30 DIAGNOSIS — I480 Paroxysmal atrial fibrillation: Secondary | ICD-10-CM | POA: Diagnosis not present

## 2015-10-30 DIAGNOSIS — Z87891 Personal history of nicotine dependence: Secondary | ICD-10-CM | POA: Insufficient documentation

## 2015-10-30 LAB — MAGNESIUM: MAGNESIUM: 2.1 mg/dL (ref 1.7–2.4)

## 2015-10-30 LAB — BASIC METABOLIC PANEL
ANION GAP: 5 (ref 5–15)
BUN: 22 mg/dL — ABNORMAL HIGH (ref 6–20)
CO2: 28 mmol/L (ref 22–32)
Calcium: 9.3 mg/dL (ref 8.9–10.3)
Chloride: 103 mmol/L (ref 101–111)
Creatinine, Ser: 1.12 mg/dL (ref 0.61–1.24)
GFR calc Af Amer: >60 mL/min (ref >60)
Glucose, Bld: 147 mg/dL — ABNORMAL HIGH (ref 65–99)
POTASSIUM: 4.2 mmol/L (ref 3.5–5.1)
SODIUM: 136 mmol/L (ref 135–145)

## 2015-10-30 MED ORDER — METOPROLOL TARTRATE 50 MG PO TABS: 25.0000 mg | ORAL_TABLET | Freq: Two times a day (BID) | ORAL | Status: DC

## 2015-10-30 NOTE — Patient Instructions (Signed)
Your physician has recommended you make the following change in your medication:    Decrease your Metoprolol to one-half tablet twice a day.  Monitor blood pressure at home.  If pressures start to increase, please call our office.   Follow up with our office in 3 months

## 2015-10-30 NOTE — Progress Notes (Signed)
Patient ID: Daniel Tucker, male   DOB: 1940-04-22, 75 y.o.   MRN: [id number]     Primary Care Physician: Binnie Rail, MD Referring Physician: Dr. Jovita Kussmaul is a 75 y.o. male with a h/o persistent  afib maintaining SR on tikosyn. He reports that he feels well.No issues with blood thinner. Not aware of any afib. Using mouth piece for sleep apnea followed thru Dr.Turner.  Returns 7/11 and feels well. No issues with afib. Being compliant with tikosyn and apixaban. No afib issues. He is on slow side at 49 bpm, will try to decrease metoprolol to 50 1/2 tab a day.  Today, he denies symptoms of palpitations, chest pain, shortness of breath, orthopnea, PND, lower extremity edema, dizziness, presyncope, syncope, or neurologic sequela. The patient is tolerating medications without difficulties and is otherwise without complaint today.   Past Medical History  Diagnosis Date  . Tubular adenoma of colon 2006    Dr Fuller Plan  . Unspecified essential hypertension   . Fasting hyperglycemia 2006    FBS 111; normal A1c  . Hyperlipidemia   . Atrial fibrillation ([place]) 08/24/2014    a. 08/2014 Echo: EF 55-60%, no rwma, mild MR, mod-sev dil LA, mod dil RA, PASP 31mmHg;  b. 09/2014 DCCV ->Return to afib after 3 days;  b. CHA2DS2VASc = 1-->Eliquis.  . Sleep apnea     "had OR; thought it was gone; having another sleep study 12/2014" (11/20/2014)  . Hepatitis ~ 1955    "yellow juandice"  . Arthritis     "hips" (11/20/2014)   Past Surgical History  Procedure Laterality Date  . Uvulopalatopharyngoplasty  1997    Dr.Woliki  . Colonoscopy w/ polypectomy  2006    [place]  . Colonoscopy  2011    no polyp  . Prostate biopsy  2010    Negative,Dr.Ottelin  . 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 Cardioversion N/A 10/12/2014    Procedure: CARDIOVERSION;  Surgeon: Darlin Coco, MD;  Location: [facility] ENDOSCOPY;  Service: Cardiovascular;  Laterality: N/A;  . Tonsillectomy and adenoidectomy   1997  . Nasal sinus surgery  1997    Current Outpatient Prescriptions  Medication Sig Dispense Refill  . acetaminophen (TYLENOL) 500 MG chewable tablet Chew 500 mg by mouth every 6 (six) hours as needed for pain.    Marland Kitchen acyclovir (ZOVIRAX) 200 MG capsule Take 2 capsules (400 mg total) by mouth 2 (two) times daily.  3  . amLODipine (NORVASC) 5 MG tablet TAKE 1 TABLET EVERY DAY 90 tablet 3  . apixaban (ELIQUIS) 5 MG TABS tablet Take 1 tablet (5 mg total) by mouth 2 (two) times daily. 180 tablet 3  . Ascorbic Acid (VITAMIN C) 1000 MG tablet Take 1,000 mg by mouth daily.      Marland Kitchen dofetilide (TIKOSYN) 500 MCG capsule Take 1 capsule (500 mcg total) by mouth 2 (two) times daily. 180 capsule 3  . GLUCOSAMINE SULFATE PO Take 2 tablets by mouth daily. Will start back med in January 2017    . metoprolol (LOPRESSOR) 50 MG tablet Take 1 tablet (50 mg total) by mouth 2 (two) times daily. 180 tablet 3  . Multiple Vitamin (MULTIVITAMIN) tablet Take 1 tablet by mouth daily.     . Multiple Vitamins-Minerals (PRESERVISION AREDS 2 PO) Take 1 tablet by mouth 2 (two) times daily.    . potassium chloride SA (K-DUR,KLOR-CON) 20 MEQ tablet Take 1 tablet (20 mEq total) by mouth 4 (  four) times daily. 360 tablet 3  . pravastatin (PRAVACHOL) 20 MG tablet TAKE 1 TABLET AT BEDTIME 90 tablet 3   No current facility-administered medications for this encounter.    No Known Allergies  Social History   Social History  . Marital Status: Married    Spouse Name: N/A  . Number of Children: N/A  . Years of Education: N/A   Occupational History  . Not on file.   Social History Main Topics  . Smoking status: Former Smoker -- 1.00 packs/day for 7 years    Types: Cigarettes  . Smokeless tobacco: Never Used     Comment: smoked cigarettes age [age], up to 1ppd  . Alcohol Use: 1.2 oz/week    1 Glasses of wine, 1 Cans of beer per week     Comment: less than 1 drink per week  . Drug Use: No  . Sexual Activity: Yes   Other  Topics Concern  . Not on file   Social History Narrative    Family History  Problem Relation Age of Onset  . Prostate cancer Father   . Hypertension Father   . Hypertension Mother   . Prostate cancer Brother   . Diabetes Neg Hx   . Heart disease Neg Hx   . Stroke Neg Hx   . Colon cancer Neg Hx   . Melanoma Sister   . Atrial fibrillation Brother   . Melanoma Brother   . Lupus Sister     ROS- All systems are reviewed and negative except as per the HPI above  Physical Exam: Filed Vitals:   10/30/15 1332  BP: 156/90  Pulse: 49  Height: 5' 7.5" (1.715 m)  Weight: 190 lb 3.2 oz (86.274 kg)    GEN- The patient is well appearing, alert and oriented x 3 today.   Head- normocephalic, atraumatic Eyes-  Sclera clear, conjunctiva pink Ears- hearing intact Oropharynx- clear Neck- supple, no JVP Lymph- no cervical lymphadenopathy Lungs- Clear to ausculation bilaterally, normal work of breathing Heart- Slow,regular rate and rhythm, no murmurs, rubs or gallops, PMI not laterally displaced GI- soft, NT, ND, + BS Extremities- no clubbing, cyanosis, or edema MS- no significant deformity or atrophy Skin- no rash or lesion Psych- euthymic mood, full affect Neuro- strength and sensation are intact  EKG- Sinus brady at 49 bpm,  with RBBB, LAD, pr int 150 ms, qrs int 132 ms, qtc 417 ms(stable).  Epic records reviewed.  Assessment and Plan: 1. afib Continue on tikosyn 500 mg bid, qtc stable Continue apixaban, no bleeding issues Bmet/mag  2.OSA F/u with Dr. Radford Pax as scheduled   3. HTN  Elevated today Avoid salt  4. Asymptomatic brady Decrease metoprolol to 50 mg 1/2 tab bid Watch BP readings at home with reduction in BB, may need increase in amlodipine if BP increases  F/u in 3 months in afib clinic  Telsa Dillavou C. Jordayn Mink, [facility] [address] [place], [place] [address] [phone]

## 2015-10-31 DIAGNOSIS — M1611 Unilateral primary osteoarthritis, right hip: Secondary | ICD-10-CM | POA: Diagnosis not present

## 2015-11-06 ENCOUNTER — Other Ambulatory Visit: Payer: Self-pay

## 2015-11-06 ENCOUNTER — Other Ambulatory Visit (HOSPITAL_COMMUNITY): Payer: Self-pay | Admitting: *Deleted

## 2015-11-06 MED ORDER — APIXABAN 5 MG PO TABS: 5.0000 mg | ORAL_TABLET | Freq: Two times a day (BID) | ORAL | Status: DC

## 2015-11-08 DIAGNOSIS — M1611 Unilateral primary osteoarthritis, right hip: Secondary | ICD-10-CM | POA: Diagnosis not present

## 2015-11-12 ENCOUNTER — Telehealth (HOSPITAL_COMMUNITY): Payer: Self-pay | Admitting: *Deleted

## 2015-11-12 DIAGNOSIS — I480 Paroxysmal atrial fibrillation: Secondary | ICD-10-CM

## 2015-11-12 MED ORDER — METOPROLOL TARTRATE 25 MG PO TABS: 12.5000 mg | ORAL_TABLET | Freq: Two times a day (BID) | ORAL | 6 refills | Status: DC

## 2015-11-12 NOTE — Telephone Encounter (Signed)
Patient called in stating over weekend he noticed HR in 40-50 range. Felt more tired with this.  Called on call physician they recommended holding dose of metoprolol if HR less than 60.  Patient held one dose of metoprolol and has taken the metoprolol today for HR 63.  Discussed with Roderic Palau NP will decrease metoprolol dose to 12.5mg  twice a day. Patient will call back if further SB after decreased dose.

## 2015-12-07 DIAGNOSIS — M1611 Unilateral primary osteoarthritis, right hip: Secondary | ICD-10-CM | POA: Diagnosis not present

## 2016-01-16 ENCOUNTER — Ambulatory Visit (INDEPENDENT_AMBULATORY_CARE_PROVIDER_SITE_OTHER): Payer: Medicare Other

## 2016-01-16 DIAGNOSIS — Z23 Encounter for immunization: Secondary | ICD-10-CM | POA: Diagnosis not present

## 2016-01-20 IMAGING — CR DG CHEST 2V
2 series · 2 of 2 positions shown · non-contrast
Comparison: None.

CLINICAL DATA: History of atrial fibrillation .

EXAM:
CHEST  2 VIEW

[w chest pa]
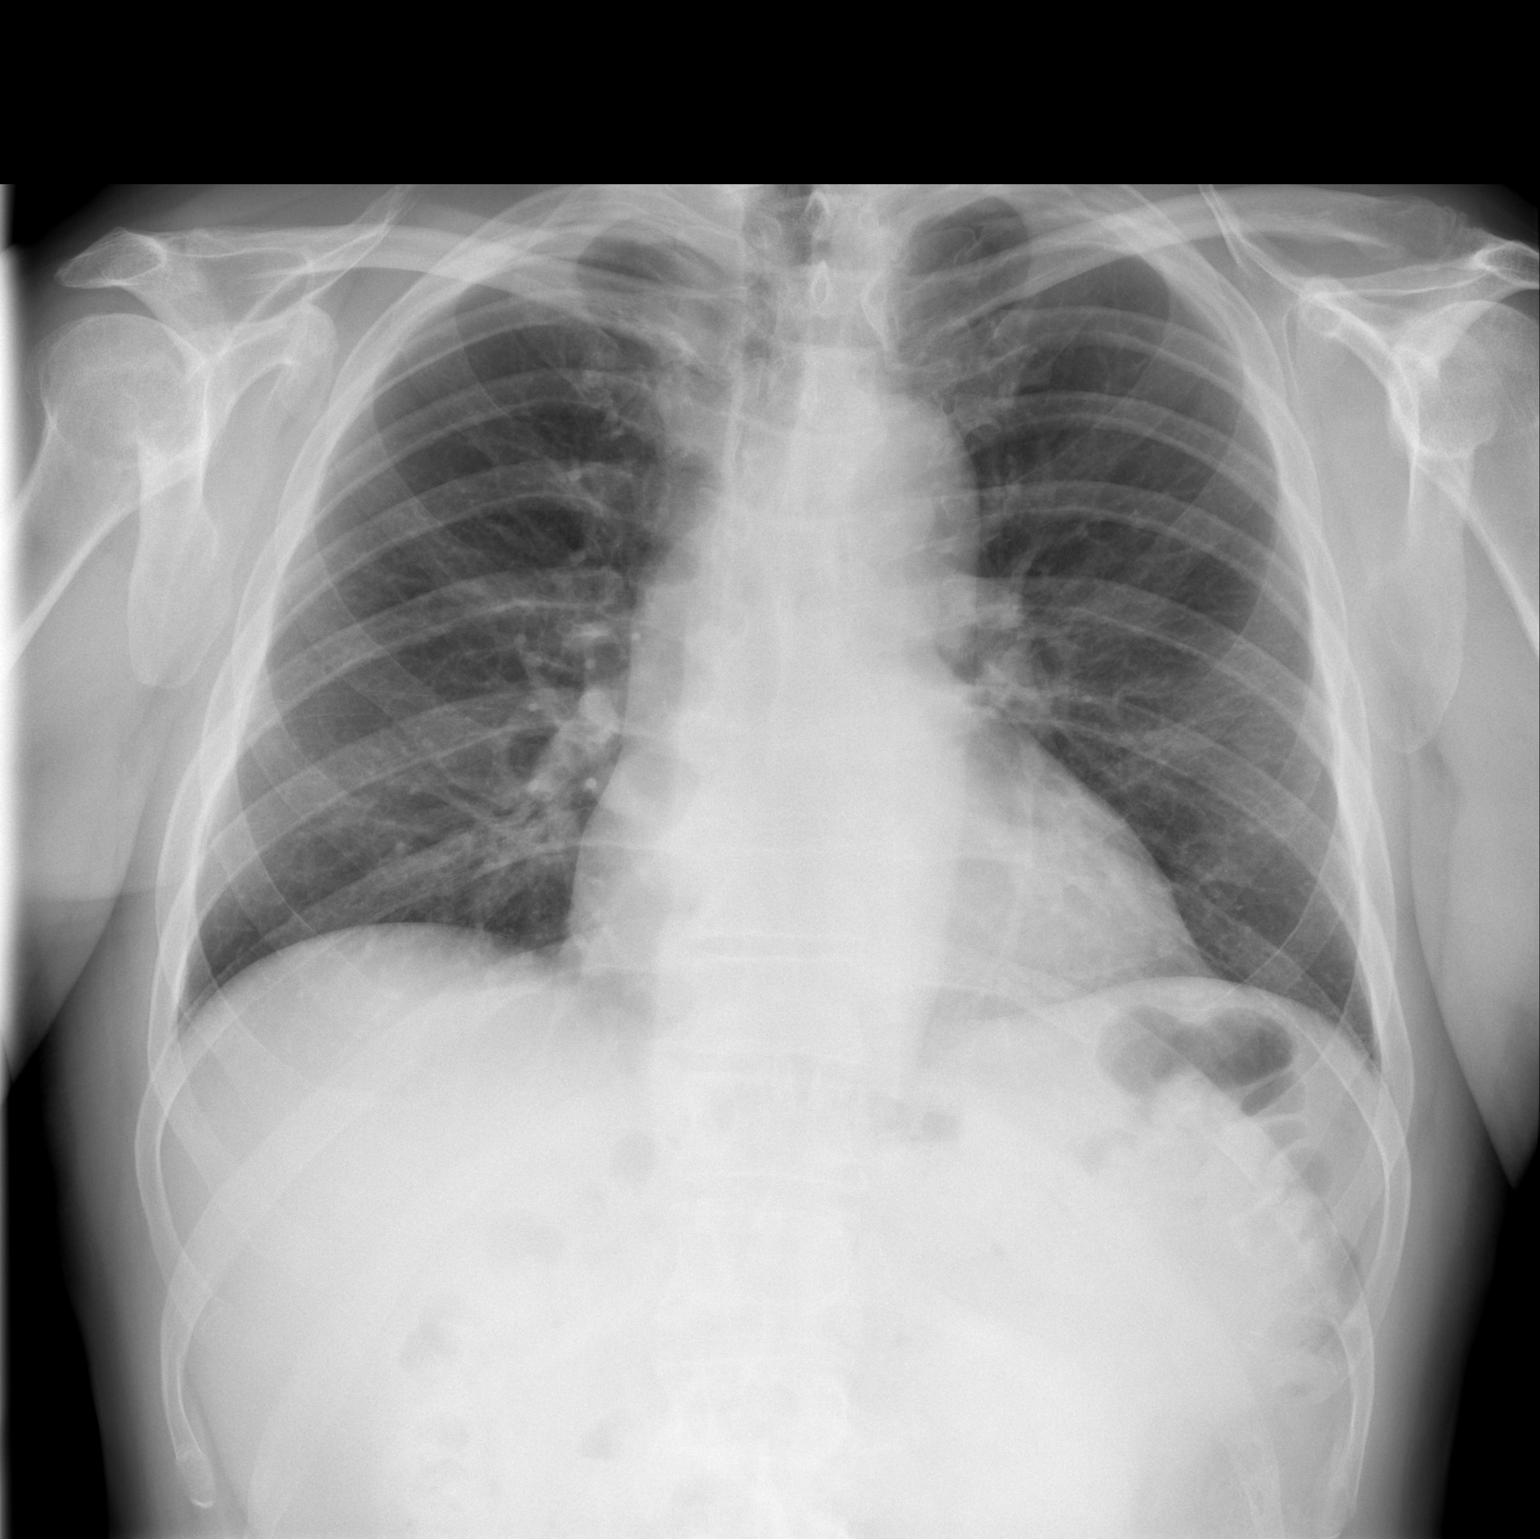

[w chest lat]
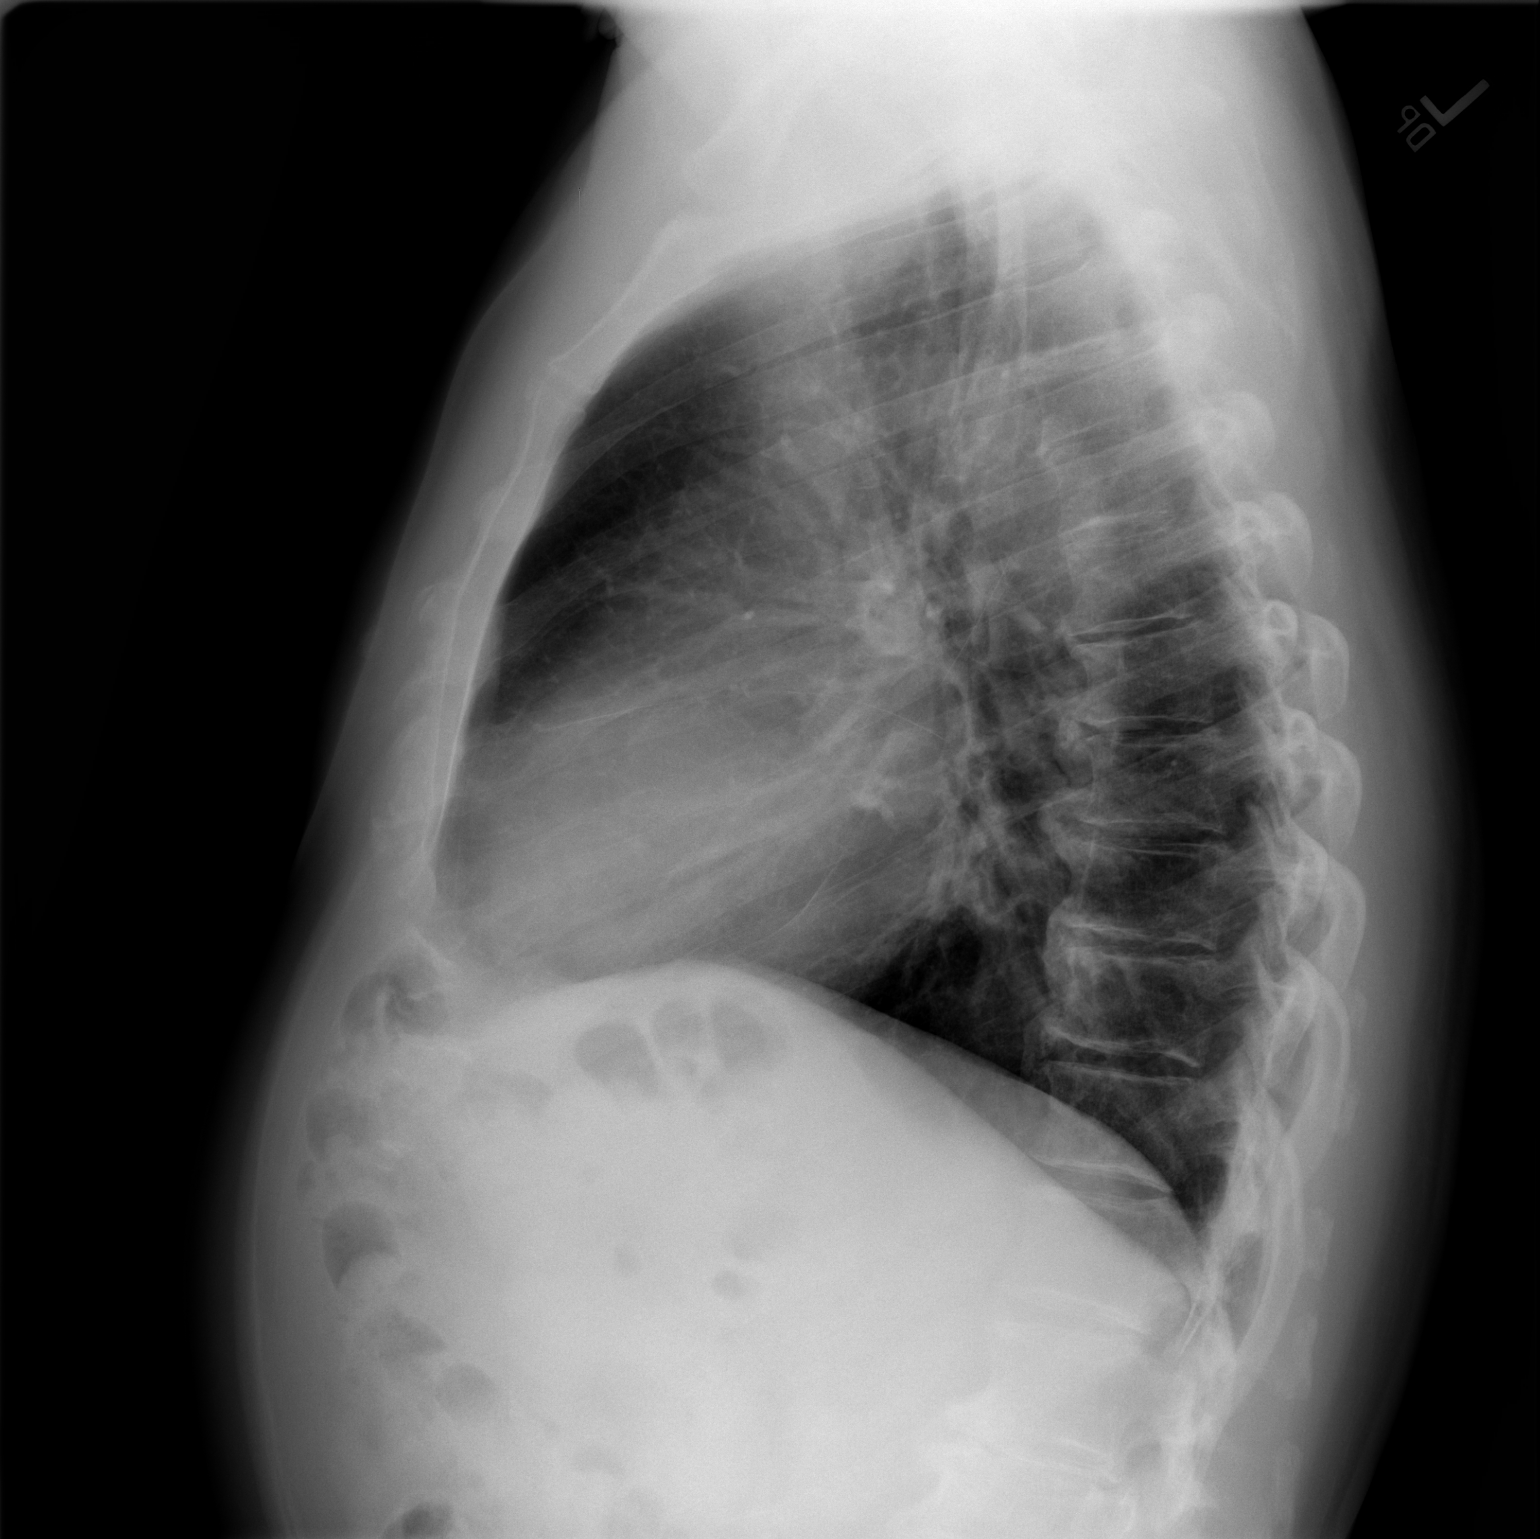

[2 of 2 positions shown; findings below may reference images not displayed]

FINDINGS: Mediastinum and hilar structures normal. Lungs are clear. Stable
cardiomegaly. No pleural effusion or pneumothorax .
IMPRESSION: Stable cardiomegaly.  No acute cardiopulmonary disease.

## 2016-02-04 ENCOUNTER — Encounter (HOSPITAL_COMMUNITY): Payer: Self-pay | Admitting: Nurse Practitioner

## 2016-02-04 ENCOUNTER — Ambulatory Visit (HOSPITAL_COMMUNITY)
Admission: RE | Admit: 2016-02-04 | Discharge: 2016-02-04 | Disposition: A | Discharge status: Home / Self Care | Payer: Medicare Other | Source: Ambulatory Visit | Attending: Nurse Practitioner | Admitting: Nurse Practitioner

## 2016-02-04 DIAGNOSIS — I4891 Unspecified atrial fibrillation: Secondary | ICD-10-CM | POA: Diagnosis not present

## 2016-02-04 DIAGNOSIS — I480 Paroxysmal atrial fibrillation: Secondary | ICD-10-CM | POA: Diagnosis not present

## 2016-02-04 LAB — MAGNESIUM: Magnesium: 2.2 mg/dL (ref 1.7–2.4)

## 2016-02-04 LAB — BASIC METABOLIC PANEL
Anion gap: 6 (ref 5–15)
BUN: 16 mg/dL (ref 6–20)
CALCIUM: 9.6 mg/dL (ref 8.9–10.3)
CHLORIDE: 104 mmol/L (ref 101–111)
CO2: 29 mmol/L (ref 22–32)
CREATININE: 1.2 mg/dL (ref 0.61–1.24)
GFR calc Af Amer: >60 mL/min (ref >60)
GFR calc non Af Amer: 58 mL/min — ABNORMAL LOW (ref >60)
Glucose, Bld: 90 mg/dL (ref 65–99)
Potassium: 4.4 mmol/L (ref 3.5–5.1)
SODIUM: 139 mmol/L (ref 135–145)

## 2016-02-04 MED ORDER — METOPROLOL TARTRATE 25 MG PO TABS: 12.5000 mg | ORAL_TABLET | Freq: Two times a day (BID) | ORAL | 3 refills | Status: DC

## 2016-02-04 NOTE — Progress Notes (Signed)
Patient ID: Daniel Tucker, male   DOB: 1940-09-16, 75 y.o.   MRN: [id number]     Primary Care Physician: Binnie Rail, MD Referring Physician: Dr. Jovita Kussmaul is a 75 y.o. male with a h/o persistent  afib maintaining SR on tikosyn. He reports that he feels well.No issues with blood thinner. Not aware of any afib. Using mouth piece for sleep apnea followed thru dentist, Dr. Toy Cookey.  Returns 7/11 and feels well. No issues with afib. Being compliant with tikosyn and apixaban. No afib issues. He is on slow side at 49 bpm, will try to decrease metoprolol to 50 1/2 tab a day.  F/u afib clinic 10/16 and feels well. No afib with tikosyn. Qtc stable.  Today, he denies symptoms of palpitations, chest pain, shortness of breath, orthopnea, PND, lower extremity edema, dizziness, presyncope, syncope, or neurologic sequela. The patient is tolerating medications without difficulties and is otherwise without complaint today.   Past Medical History:  Diagnosis Date  . Arthritis    "hips" (11/20/2014)  . Atrial fibrillation ([place]) 08/24/2014   a. 08/2014 Echo: EF 55-60%, no rwma, mild MR, mod-sev dil LA, mod dil RA, PASP 87mmHg;  b. 09/2014 DCCV ->Return to afib after 3 days;  b. CHA2DS2VASc = 1-->Eliquis.  . Fasting hyperglycemia 2006   FBS 111; normal A1c  . Hepatitis ~ 1955   "yellow juandice"  . Hyperlipidemia   . Sleep apnea    "had OR; thought it was gone; having another sleep study 12/2014" (11/20/2014)  . Tubular adenoma of colon 2006   Dr Fuller Plan  . Unspecified essential hypertension    Past Surgical History:  Procedure Laterality Date  . CARDIOVERSION N/A 10/12/2014   Procedure: CARDIOVERSION;  Surgeon: Darlin Coco, MD;  Location: [facility] ENDOSCOPY;  Service: Cardiovascular;  Laterality: N/A;  . COLONOSCOPY  2011   no polyp  . COLONOSCOPY W/ POLYPECTOMY  2006   [place]  . NASAL SINUS SURGERY  1997  . PROSTATE BIOPSY  2010   Negative,Dr.Ottelin  . TONSILLECTOMY AND  ADENOIDECTOMY  1997  . 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 UVULOPALATOPHARYNGOPLASTY  1997   Dr.Woliki    Current Outpatient Prescriptions  Medication Sig Dispense Refill  . acetaminophen (TYLENOL) 500 MG chewable tablet Chew 500 mg by mouth every 6 (six) hours as needed for pain.    Marland Kitchen acyclovir (ZOVIRAX) 200 MG capsule Take 2 capsules (400 mg total) by mouth 2 (two) times daily.  3  . amLODipine (NORVASC) 5 MG tablet TAKE 1 TABLET EVERY DAY 90 tablet 3  . apixaban (ELIQUIS) 5 MG TABS tablet Take 1 tablet (5 mg total) by mouth 2 (two) times daily. 14 tablet 3  . Ascorbic Acid (VITAMIN C) 1000 MG tablet Take 1,000 mg by mouth daily.      Marland Kitchen dofetilide (TIKOSYN) 500 MCG capsule Take 1 capsule (500 mcg total) by mouth 2 (two) times daily. 180 capsule 3  . metoprolol (LOPRESSOR) 25 MG tablet Take 0.5 tablets (12.5 mg total) by mouth 2 (two) times daily. 30 tablet 6  . Multiple Vitamin (MULTIVITAMIN) tablet Take 1 tablet by mouth daily.     . Multiple Vitamins-Minerals (PRESERVISION AREDS 2 PO) Take 1 tablet by mouth 2 (two) times daily.    . potassium chloride SA (K-DUR,KLOR-CON) 20 MEQ tablet Take 1 tablet (20 mEq total) by mouth 4 (four) times daily. (Patient taking differently: Take 40 mEq by mouth 2 (two) times daily. )  360 tablet 3  . pravastatin (PRAVACHOL) 20 MG tablet TAKE 1 TABLET AT BEDTIME 90 tablet 3  . GLUCOSAMINE SULFATE PO Take 2 tablets by mouth daily. Will start back med in January 2017     No current facility-administered medications for this encounter.     No Known Allergies  Social History   Social History  . Marital status: Married    Spouse name: N/A  . Number of children: N/A  . Years of education: N/A   Occupational History  . Not on file.   Social History Main Topics  . Smoking status: Former Smoker    Packs/day: 1.00    Years: 7.00    Types: Cigarettes  . Smokeless tobacco: Never Used     Comment: smoked cigarettes age [age], up to 1ppd    . Alcohol use 1.2 oz/week    1 Glasses of wine, 1 Cans of beer per week     Comment: less than 1 drink per week  . Drug use: No  . Sexual activity: Yes   Other Topics Concern  . Not on file   Social History Narrative  . No narrative on file    Family History  Problem Relation Age of Onset  . Prostate cancer Father   . Hypertension Father   . Hypertension Mother   . Prostate cancer Brother   . Diabetes Neg Hx   . Heart disease Neg Hx   . Stroke Neg Hx   . Colon cancer Neg Hx   . Melanoma Sister   . Atrial fibrillation Brother   . Melanoma Brother   . Lupus Sister     ROS- All systems are reviewed and negative except as per the HPI above  Physical Exam: Vitals:   02/04/16 1352  BP: (!) 152/86  Weight: 193 lb (87.5 kg)  Height: 5' 7.5" (1.715 m)    GEN- The patient is well appearing, alert and oriented x 3 today.   Head- normocephalic, atraumatic Eyes-  Sclera clear, conjunctiva pink Ears- hearing intact Oropharynx- clear Neck- supple, no JVP Lymph- no cervical lymphadenopathy Lungs- Clear to ausculation bilaterally, normal work of breathing Heart- Slow,regular rate and rhythm, no murmurs, rubs or gallops, PMI not laterally displaced GI- soft, NT, ND, + BS Extremities- no clubbing, cyanosis, or edema MS- no significant deformity or atrophy Skin- no rash or lesion Psych- euthymic mood, full affect Neuro- strength and sensation are intact  EKG- Sinus brady at 57 bpm,  With LAD, RBBB,  pr int 150 ms, qrs int 134 ms, qtc 441 ms(stable).  Epic records reviewed.  Assessment and Plan: 1. afib Continue on tikosyn 500 mg bid, qtc stable Continue apixaban, no bleeding issues Bmet/mag  2.OSA F/u with Dr. Toy Cookey for mouthpiece   3. HTN  Avoid salt  4. Asymptomatic brady Improved  with reduction of metoprolol   F/u in 3 months in afib clinic  Marbeth Smedley C. Erica Osuna, [facility] [address] [place], [place]  [address] [phone]

## 2016-02-13 DIAGNOSIS — M1611 Unilateral primary osteoarthritis, right hip: Secondary | ICD-10-CM | POA: Diagnosis not present

## 2016-03-20 DIAGNOSIS — M1611 Unilateral primary osteoarthritis, right hip: Secondary | ICD-10-CM | POA: Diagnosis not present

## 2016-03-28 ENCOUNTER — Other Ambulatory Visit: Payer: Self-pay

## 2016-04-02 ENCOUNTER — Other Ambulatory Visit (HOSPITAL_COMMUNITY): Payer: Self-pay | Admitting: Nurse Practitioner

## 2016-05-05 ENCOUNTER — Encounter (HOSPITAL_COMMUNITY): Payer: Self-pay | Admitting: Nurse Practitioner

## 2016-05-05 ENCOUNTER — Ambulatory Visit (HOSPITAL_COMMUNITY)
Admission: RE | Admit: 2016-05-05 | Discharge: 2016-05-05 | Disposition: A | Discharge status: Home / Self Care | Payer: Medicare Other | Source: Ambulatory Visit | Attending: Nurse Practitioner | Admitting: Nurse Practitioner

## 2016-05-05 VITALS — BP 148/84 | HR 60 | Ht 67.5 in | Wt 194.5 lb

## 2016-05-05 DIAGNOSIS — I10 Essential (primary) hypertension: Secondary | ICD-10-CM | POA: Insufficient documentation

## 2016-05-05 DIAGNOSIS — Z7901 Long term (current) use of anticoagulants: Secondary | ICD-10-CM | POA: Diagnosis not present

## 2016-05-05 DIAGNOSIS — I481 Persistent atrial fibrillation: Secondary | ICD-10-CM | POA: Insufficient documentation

## 2016-05-05 DIAGNOSIS — Z79899 Other long term (current) drug therapy: Secondary | ICD-10-CM | POA: Diagnosis not present

## 2016-05-05 DIAGNOSIS — G4733 Obstructive sleep apnea (adult) (pediatric): Secondary | ICD-10-CM | POA: Diagnosis not present

## 2016-05-05 DIAGNOSIS — I480 Paroxysmal atrial fibrillation: Secondary | ICD-10-CM

## 2016-05-05 DIAGNOSIS — Z96642 Presence of left artificial hip joint: Secondary | ICD-10-CM | POA: Insufficient documentation

## 2016-05-05 DIAGNOSIS — I4891 Unspecified atrial fibrillation: Secondary | ICD-10-CM | POA: Diagnosis present

## 2016-05-05 DIAGNOSIS — E785 Hyperlipidemia, unspecified: Secondary | ICD-10-CM | POA: Insufficient documentation

## 2016-05-05 DIAGNOSIS — Z87891 Personal history of nicotine dependence: Secondary | ICD-10-CM | POA: Insufficient documentation

## 2016-05-05 LAB — BASIC METABOLIC PANEL
ANION GAP: 6 (ref 5–15)
BUN: 17 mg/dL (ref 6–20)
CALCIUM: 9.3 mg/dL (ref 8.9–10.3)
CO2: 29 mmol/L (ref 22–32)
Chloride: 103 mmol/L (ref 101–111)
Creatinine, Ser: 1.08 mg/dL (ref 0.61–1.24)
GFR calc Af Amer: >60 mL/min (ref >60)
Glucose, Bld: 105 mg/dL — ABNORMAL HIGH (ref 65–99)
Potassium: 4.2 mmol/L (ref 3.5–5.1)
Sodium: 138 mmol/L (ref 135–145)

## 2016-05-05 LAB — MAGNESIUM: MAGNESIUM: 2.2 mg/dL (ref 1.7–2.4)

## 2016-05-05 NOTE — Progress Notes (Signed)
Patient ID: Daniel Tucker, male   DOB: 09-Aug-1940, 76 y.o.   MRN: [id number]     Primary Care Physician: Binnie Rail, MD Referring Physician: Dr. Jovita Kussmaul is a 77 y.o. male with a h/o persistent  afib maintaining SR on tikosyn. He reports that he feels well.No issues with blood thinner. Not aware of any afib. Using mouth piece for sleep apnea followed thru dentist, Dr. Toy Cookey.  Returns 7/11 and feels well. No issues with afib. Being compliant with tikosyn and apixaban. No afib issues. He is on slow side at 49 bpm, will try to decrease metoprolol to 50 1/2 tab a day.  F/u afib clinic 10/16 and feels well. No afib with tikosyn. Qtc stable.  Afib clinic 1/18, no afib occurence that he is aware. Qtc stable.  Today, he denies symptoms of palpitations, chest pain, shortness of breath, orthopnea, PND, lower extremity edema, dizziness, presyncope, syncope, or neurologic sequela. The patient is tolerating medications without difficulties and is otherwise without complaint today.   Past Medical History:  Diagnosis Date  . Arthritis    "hips" (11/20/2014)  . Atrial fibrillation ([place]) 08/24/2014   a. 08/2014 Echo: EF 55-60%, no rwma, mild MR, mod-sev dil LA, mod dil RA, PASP 29mmHg;  b. 09/2014 DCCV ->Return to afib after 3 days;  b. CHA2DS2VASc = 1-->Eliquis.  . Fasting hyperglycemia 2006   FBS 111; normal A1c  . Hepatitis ~ 1955   "yellow juandice"  . Hyperlipidemia   . Sleep apnea    "had OR; thought it was gone; having another sleep study 12/2014" (11/20/2014)  . Tubular adenoma of colon 2006   Dr Fuller Plan  . Unspecified essential hypertension    Past Surgical History:  Procedure Laterality Date  . CARDIOVERSION N/A 10/12/2014   Procedure: CARDIOVERSION;  Surgeon: Darlin Coco, MD;  Location: [facility] ENDOSCOPY;  Service: Cardiovascular;  Laterality: N/A;  . COLONOSCOPY  2011   no polyp  . COLONOSCOPY W/ POLYPECTOMY  2006   [place]  . NASAL SINUS SURGERY  1997  . PROSTATE  BIOPSY  2010   Negative,Dr.Ottelin  . TONSILLECTOMY AND ADENOIDECTOMY  1997  . 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 UVULOPALATOPHARYNGOPLASTY  1997   Dr.Woliki    Current Outpatient Prescriptions  Medication Sig Dispense Refill  . acetaminophen (TYLENOL) 500 MG chewable tablet Chew 500 mg by mouth every 6 (six) hours as needed for pain.    Marland Kitchen acyclovir (ZOVIRAX) 200 MG capsule Take 2 capsules (400 mg total) by mouth 2 (two) times daily.  3  . amLODipine (NORVASC) 5 MG tablet TAKE 1 TABLET EVERY DAY 90 tablet 3  . apixaban (ELIQUIS) 5 MG TABS tablet Take 1 tablet (5 mg total) by mouth 2 (two) times daily. 14 tablet 3  . Ascorbic Acid (VITAMIN C) 1000 MG tablet Take 1,000 mg by mouth daily.      Marland Kitchen dofetilide (TIKOSYN) 500 MCG capsule Take 1 capsule (500 mcg total) by mouth 2 (two) times daily. 180 capsule 3  . GLUCOSAMINE SULFATE PO Take 2 tablets by mouth daily. Will start back med in January 2017    . metoprolol tartrate (LOPRESSOR) 25 MG tablet Take 0.5 tablets (12.5 mg total) by mouth 2 (two) times daily. 90 tablet 3  . Multiple Vitamin (MULTIVITAMIN) tablet Take 1 tablet by mouth daily.     . Multiple Vitamins-Minerals (PRESERVISION AREDS 2 PO) Take 1 tablet by mouth 2 (two) times daily.    Marland Kitchen  potassium chloride SA (K-DUR,KLOR-CON) 20 MEQ tablet TAKE 1 TABLET FOUR TIMES DAILY 360 tablet 3  . pravastatin (PRAVACHOL) 20 MG tablet TAKE 1 TABLET AT BEDTIME 90 tablet 3   No current facility-administered medications for this encounter.     No Known Allergies  Social History   Social History  . Marital status: Married    Spouse name: N/A  . Number of children: N/A  . Years of education: N/A   Occupational History  . Not on file.   Social History Main Topics  . Smoking status: Former Smoker    Packs/day: 1.00    Years: 7.00    Types: Cigarettes  . Smokeless tobacco: Never Used     Comment: smoked cigarettes age [age], up to 1ppd  . Alcohol use 1.2 oz/week    1  Glasses of wine, 1 Cans of beer per week     Comment: less than 1 drink per week  . Drug use: No  . Sexual activity: Yes   Other Topics Concern  . Not on file   Social History Narrative  . No narrative on file    Family History  Problem Relation Age of Onset  . Prostate cancer Father   . Hypertension Father   . Hypertension Mother   . Prostate cancer Brother   . Diabetes Neg Hx   . Heart disease Neg Hx   . Stroke Neg Hx   . Colon cancer Neg Hx   . Melanoma Sister   . Atrial fibrillation Brother   . Melanoma Brother   . Lupus Sister     ROS- All systems are reviewed and negative except as per the HPI above  Physical Exam: Vitals:   05/05/16 1410  BP: (!) 148/84  Pulse: 60  Weight: 194 lb 8 oz (88.2 kg)  Height: 5' 7.5" (1.715 m)    GEN- The patient is well appearing, alert and oriented x 3 today.   Head- normocephalic, atraumatic Eyes-  Sclera clear, conjunctiva pink Ears- hearing intact Oropharynx- clear Neck- supple, no JVP Lymph- no cervical lymphadenopathy Lungs- Clear to ausculation bilaterally, normal work of breathing Heart- Slow,regular rate and rhythm, no murmurs, rubs or gallops, PMI not laterally displaced GI- soft, NT, ND, + BS Extremities- no clubbing, cyanosis, or edema MS- no significant deformity or atrophy Skin- no rash or lesion Psych- euthymic mood, full affect Neuro- strength and sensation are intact  EKG- Sinus rhythm at 60 bpm,  LAD, RBBB,  pr int 134 ms, qrs int 134 ms, qtc 448 ms(stable).  Epic records reviewed.  Assessment and Plan: 1. afib Continue on tikosyn 500 mg bid, qtc stable Continue apixaban, no bleeding issues Bmet/mag  2.OSA F/u with Dr. Toy Cookey for mouthpiece   3. HTN  Avoid salt  4. Asymptomatic brady Improved  with reduction of metoprolol   F/u in 3 months in afib clinic  Ryder Man C. Lamarr Feenstra, [facility] [address] [place], [place] [address] [phone]

## 2016-06-06 ENCOUNTER — Encounter: Payer: Self-pay | Admitting: *Deleted

## 2016-06-18 ENCOUNTER — Encounter: Payer: Self-pay | Admitting: Cardiology

## 2016-06-18 ENCOUNTER — Telehealth: Payer: Self-pay | Admitting: Cardiology

## 2016-06-18 ENCOUNTER — Encounter (INDEPENDENT_AMBULATORY_CARE_PROVIDER_SITE_OTHER): Payer: Self-pay

## 2016-06-18 ENCOUNTER — Ambulatory Visit (INDEPENDENT_AMBULATORY_CARE_PROVIDER_SITE_OTHER): Payer: Medicare Other | Admitting: Cardiology

## 2016-06-18 VITALS — BP 138/78 | HR 68 | Ht 68.5 in | Wt 192.0 lb

## 2016-06-18 DIAGNOSIS — G4733 Obstructive sleep apnea (adult) (pediatric): Secondary | ICD-10-CM

## 2016-06-18 DIAGNOSIS — I10 Essential (primary) hypertension: Secondary | ICD-10-CM

## 2016-06-18 DIAGNOSIS — I481 Persistent atrial fibrillation: Secondary | ICD-10-CM | POA: Diagnosis not present

## 2016-06-18 DIAGNOSIS — I4819 Other persistent atrial fibrillation: Secondary | ICD-10-CM

## 2016-06-18 NOTE — Patient Instructions (Signed)

## 2016-06-18 NOTE — Progress Notes (Signed)
Cardiology Office Note    Date:  06/19/2016   ID:  Daniel Tucker, DOB [birth year], MRN [id number]  PCP:  Binnie Rail, MD  Cardiologist:  Fransico Him, MD   Chief Complaint  Patient presents with  . Sleep Apnea  . Hypertension  . Atrial Fibrillation    History of Present Illness:  Daniel Tucker is a 76 y.o. male who presents for followup of OSA. He has a history of atrial fibrillation and recently underwent a sleep study which showed mild OSA with an AHI of 12.3/hr and oxygen desaturations to 86% with soft snoring.He has a history of severe snoring years ago which improved with tonsillectomy and uvuloplasty. He was referred to dentistry for oral device and is doing well with it.  He tolerates it without any discomfort. He says that his wife says he no longer snores and he feels more rested in the am with less daytime sleepiness.  He sleeps fairly well at night.  He does have some problems with right hip pain at night that can keep him up at night.  He denies any palpitations, chest pain, SOB, DOE, LE edema, dizziness or syncope.     Past Medical History:  Diagnosis Date  . Arthritis    "hips" (11/20/2014)  . Benign essential HTN   . Fasting hyperglycemia 2006   FBS 111; normal A1c  . Hepatitis ~ 1955   "yellow juandice"  . Hyperlipidemia   . Persistent atrial fibrillation ([place]) 08/24/2014   CHASD2VASC score is 3 - on Apixaban  . Sleep apnea    intolerant to CPAP and now using oral device followed by Dr. Toy Cookey  . Tubular adenoma of colon 2006   Dr Fuller Plan    Past Surgical History:  Procedure Laterality Date  . CARDIOVERSION N/A 10/12/2014   Procedure: CARDIOVERSION;  Surgeon: Darlin Coco, MD;  Location: [facility] ENDOSCOPY;  Service: Cardiovascular;  Laterality: N/A;  . COLONOSCOPY  2011   no polyp  . COLONOSCOPY W/ POLYPECTOMY  2006   [place]  . NASAL SINUS SURGERY  1997  . PROSTATE BIOPSY  2010   Negative,Dr.Ottelin  . TONSILLECTOMY AND ADENOIDECTOMY  1997  .  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 UVULOPALATOPHARYNGOPLASTY  1997   Dr.Woliki    Current Medications: Current Meds  Medication Sig  . acetaminophen (TYLENOL) 500 MG chewable tablet Chew 500 mg by mouth every 6 (six) hours as needed for pain.  Marland Kitchen acyclovir (ZOVIRAX) 200 MG capsule Take 2 capsules (400 mg total) by mouth 2 (two) times daily.  Marland Kitchen amLODipine (NORVASC) 5 MG tablet Take 5 mg by mouth daily.  Marland Kitchen apixaban (ELIQUIS) 5 MG TABS tablet Take 1 tablet (5 mg total) by mouth 2 (two) times daily.  . Ascorbic Acid (VITAMIN C) 1000 MG tablet Take 1,000 mg by mouth daily.    Marland Kitchen dofetilide (TIKOSYN) 500 MCG capsule Take 1 capsule (500 mcg total) by mouth 2 (two) times daily.  Marland Kitchen GLUCOSAMINE SULFATE PO Take 2 tablets by mouth daily. Will start back med in January 2017  . metoprolol tartrate (LOPRESSOR) 25 MG tablet Take 0.5 tablets (12.5 mg total) by mouth 2 (two) times daily.  . Multiple Vitamin (MULTIVITAMIN) tablet Take 1 tablet by mouth daily.   . Multiple Vitamins-Minerals (PRESERVISION AREDS 2 PO) Take 1 tablet by mouth 2 (two) times daily.  . potassium chloride SA (K-DUR,KLOR-CON) 20 MEQ tablet Take 40 mEq by mouth 2 (two) times daily.  . pravastatin (  PRAVACHOL) 20 MG tablet Take 20 mg by mouth daily.    Allergies:   Patient has no known allergies.   Social History   Social History  . Marital status: Married    Spouse name: N/A  . Number of children: N/A  . Years of education: N/A   Social History Main Topics  . Smoking status: Former Smoker    Packs/day: 1.00    Years: 7.00    Types: Cigarettes  . Smokeless tobacco: Never Used     Comment: smoked cigarettes age [age], up to 1ppd  . Alcohol use 1.2 oz/week    1 Glasses of wine, 1 Cans of beer per week     Comment: less than 1 drink per week  . Drug use: No  . Sexual activity: Yes   Other Topics Concern  . None   Social History Narrative  . None     Family History:  The patient's family history  includes Atrial fibrillation in his brother; Hypertension in his father and mother; Lupus in his sister; Melanoma in his brother and sister; Prostate cancer in his brother and father.   ROS:   Please see the history of present illness.    ROS All other systems reviewed and are negative.  No flowsheet data found.     PHYSICAL EXAM:   VS:  BP 138/78   Pulse 68   Ht 5' 8.5" (1.74 m)   Wt 192 lb (87.1 kg)   BMI 28.77 kg/m    GEN: Well nourished, well developed, in no acute distress  HEENT: normal  Neck: no JVD, carotid bruits, or masses Cardiac: RRR; no murmurs, rubs, or gallops,no edema.  Intact distal pulses bilaterally.  Respiratory:  clear to auscultation bilaterally, normal work of breathing GI: soft, nontender, nondistended, + BS MS: no deformity or atrophy  Skin: warm and dry, no rash Neuro:  Alert and Oriented x 3, Strength and sensation are intact Psych: euthymic mood, full affect  Wt Readings from Last 3 Encounters:  06/18/16 192 lb (87.1 kg)  05/05/16 194 lb 8 oz (88.2 kg)  02/04/16 193 lb (87.5 kg)      Studies/Labs Reviewed:   EKG:  EKG is not ordered today.    Recent Labs: 09/05/2015: ALT 16; Hemoglobin 14.6; Platelets 217.0; TSH 1.44 05/05/2016: BUN 17; Creatinine, Ser 1.08; Magnesium 2.2; Potassium 4.2; Sodium 138   Lipid Panel    Component Value Date/Time   CHOL 150 09/05/2015 0845   CHOL 162 08/24/2014 1042   TRIG 102.0 09/05/2015 0845   TRIG 77 08/24/2014 1042   HDL 37.80 (L) 09/05/2015 0845   HDL 50 08/24/2014 1042   CHOLHDL 4 09/05/2015 0845   VLDL 20.4 09/05/2015 0845   LDLCALC 92 09/05/2015 0845   LDLCALC 97 08/24/2014 1042    Additional studies/ records that were reviewed today include:  none    ASSESSMENT:    1. Obstructive sleep apnea   2. Essential hypertension   3. Persistent atrial fibrillation (HCC)      PLAN:  In order of problems listed above:  OSA - the patient is tolerating oral device well without any problems. I  will get the latest home sleep study from Dr. Toy Cookey.   HTN - Bp controlled on current meds.  He will continue on amlodipine/BB. Persistent atrial fibrillation maintaining NSR on Tikosyn.  He does not think that he has had an reoccurrence on his afib.  He will continue on Tikosyn, BB and apixaban.  Potassium  and Magnesium normal on labs 04/2016.     Medication Adjustments/Labs and Tests Ordered: Current medicines are reviewed at length with the patient today.  Concerns regarding medicines are outlined above.  Medication changes, Labs and Tests ordered today are listed in the Patient Instructions below.  Patient Instructions  Medication Instructions:  Your physician recommends that you continue on your current medications as directed. Please refer to the Current Medication list given to you today.   Labwork: None  Testing/Procedures: None  Follow-Up: Your physician wants you to follow-up in: 6 months with Dr. Radford Pax. You will receive a reminder letter in the mail two months in advance. If you don't receive a letter, please call our office to schedule the follow-up appointment.   Any Other Special Instructions Will Be Listed Below (If Applicable).     If you need a refill on your cardiac medications before your next appointment, please call your pharmacy.      Signed, Fransico Him, MD  06/19/2016 11:09 AM    [address], [place], [place]  [address] Phone: [phone]; Fax: [phone]

## 2016-06-18 NOTE — Telephone Encounter (Signed)
Reviewed patients home sleep study with him.  He is still having mild to moderate OSA with an AHI of 15.5/hr despite oral device. Please refer him to Dr. Redmond Baseman at [facility] for consideration of Inspire stimulator device.

## 2016-06-19 NOTE — Telephone Encounter (Signed)
Reviewed sleep study results with patient. He agrees to Dr. Redmond Baseman referral.  Referral placed.

## 2016-06-19 NOTE — Telephone Encounter (Signed)
Patient has scheduled appointment with Dr. Redmond Baseman 3/22.

## 2016-06-19 NOTE — Addendum Note (Signed)
Addended by: Harland German A on: 06/19/2016 09:56 AM   Modules accepted: Orders

## 2016-07-10 DIAGNOSIS — G4733 Obstructive sleep apnea (adult) (pediatric): Secondary | ICD-10-CM | POA: Diagnosis not present

## 2016-07-28 ENCOUNTER — Other Ambulatory Visit (HOSPITAL_COMMUNITY): Payer: Self-pay | Admitting: Nurse Practitioner

## 2016-08-04 ENCOUNTER — Encounter (HOSPITAL_COMMUNITY): Payer: Self-pay | Admitting: Nurse Practitioner

## 2016-08-04 ENCOUNTER — Ambulatory Visit (HOSPITAL_COMMUNITY)
Admission: RE | Admit: 2016-08-04 | Discharge: 2016-08-04 | Disposition: A | Discharge status: Home / Self Care | Payer: Medicare Other | Source: Ambulatory Visit | Attending: Nurse Practitioner | Admitting: Nurse Practitioner

## 2016-08-04 VITALS — BP 146/94 | HR 59 | Ht 68.0 in | Wt 192.8 lb

## 2016-08-04 DIAGNOSIS — Z833 Family history of diabetes mellitus: Secondary | ICD-10-CM | POA: Insufficient documentation

## 2016-08-04 DIAGNOSIS — Z87891 Personal history of nicotine dependence: Secondary | ICD-10-CM | POA: Diagnosis not present

## 2016-08-04 DIAGNOSIS — I480 Paroxysmal atrial fibrillation: Secondary | ICD-10-CM | POA: Diagnosis not present

## 2016-08-04 DIAGNOSIS — I4510 Unspecified right bundle-branch block: Secondary | ICD-10-CM | POA: Insufficient documentation

## 2016-08-04 DIAGNOSIS — Z808 Family history of malignant neoplasm of other organs or systems: Secondary | ICD-10-CM | POA: Insufficient documentation

## 2016-08-04 DIAGNOSIS — Z79899 Other long term (current) drug therapy: Secondary | ICD-10-CM | POA: Insufficient documentation

## 2016-08-04 DIAGNOSIS — G4733 Obstructive sleep apnea (adult) (pediatric): Secondary | ICD-10-CM | POA: Diagnosis not present

## 2016-08-04 DIAGNOSIS — Z823 Family history of stroke: Secondary | ICD-10-CM | POA: Insufficient documentation

## 2016-08-04 DIAGNOSIS — I481 Persistent atrial fibrillation: Secondary | ICD-10-CM | POA: Insufficient documentation

## 2016-08-04 DIAGNOSIS — I10 Essential (primary) hypertension: Secondary | ICD-10-CM | POA: Insufficient documentation

## 2016-08-04 DIAGNOSIS — Z9889 Other specified postprocedural states: Secondary | ICD-10-CM | POA: Insufficient documentation

## 2016-08-04 DIAGNOSIS — Z8249 Family history of ischemic heart disease and other diseases of the circulatory system: Secondary | ICD-10-CM | POA: Insufficient documentation

## 2016-08-04 DIAGNOSIS — R001 Bradycardia, unspecified: Secondary | ICD-10-CM | POA: Diagnosis not present

## 2016-08-04 LAB — BASIC METABOLIC PANEL
ANION GAP: 7 (ref 5–15)
BUN: 17 mg/dL (ref 6–20)
CALCIUM: 9.6 mg/dL (ref 8.9–10.3)
CO2: 30 mmol/L (ref 22–32)
CREATININE: 1.06 mg/dL (ref 0.61–1.24)
Chloride: 102 mmol/L (ref 101–111)
GFR calc non Af Amer: >60 mL/min (ref >60)
Glucose, Bld: 97 mg/dL (ref 65–99)
Potassium: 4 mmol/L (ref 3.5–5.1)
Sodium: 139 mmol/L (ref 135–145)

## 2016-08-04 LAB — MAGNESIUM: MAGNESIUM: 2 mg/dL (ref 1.7–2.4)

## 2016-08-04 NOTE — Progress Notes (Signed)
Patient ID: Daniel Tucker, male   DOB: 03-26-1941, 76 y.o.   MRN: [id number]     Primary Care Physician: Binnie Rail, MD Referring Physician: Dr. Jovita Kussmaul is a 76 y.o. male with a h/o persistent  afib maintaining SR on tikosyn. He reports that he feels well.No issues with blood thinner. Not aware of any afib. Using mouth piece for sleep apnea followed thru dentist, Dr. Toy Cookey.  Returns 7/11 and feels well. No issues with afib. Being compliant with tikosyn and apixaban. No afib issues. He is on slow side at 49 bpm, will try to decrease metoprolol to 50 1/2 tab a day.  F/u afib clinic 10/16 and feels well. No afib with tikosyn. Qtc stable.  Afib clinic 1/18, no afib occurence that he is aware. Qtc stable.  F/u afib clinic 4/16, he continues on tikosyn and feels well. No breakthrough afib. No bleeding issues with eliquis. He continues to wear a mouth piece for sleep apnea followed by Dr. Radford Pax, recently re evaluated with a home sleep study.  Today, he denies symptoms of palpitations, chest pain, shortness of breath, orthopnea, PND, lower extremity edema, dizziness, presyncope, syncope, or neurologic sequela. The patient is tolerating medications without difficulties and is otherwise without complaint today.   Past Medical History:  Diagnosis Date  . Arthritis    "hips" (11/20/2014)  . Benign essential HTN   . Fasting hyperglycemia 2006   FBS 111; normal A1c  . Hepatitis ~ 1955   "yellow juandice"  . Hyperlipidemia   . Persistent atrial fibrillation ([place]) 08/24/2014   CHASD2VASC score is 3 - on Apixaban  . Sleep apnea    intolerant to CPAP and now using oral device followed by Dr. Toy Cookey  . Tubular adenoma of colon 2006   Dr Fuller Plan   Past Surgical History:  Procedure Laterality Date  . CARDIOVERSION N/A 10/12/2014   Procedure: CARDIOVERSION;  Surgeon: Darlin Coco, MD;  Location: [facility] ENDOSCOPY;  Service: Cardiovascular;  Laterality: N/A;  . COLONOSCOPY   2011   no polyp  . COLONOSCOPY W/ POLYPECTOMY  2006   [place]  . NASAL SINUS SURGERY  1997  . PROSTATE BIOPSY  2010   Negative,Dr.Ottelin  . TONSILLECTOMY AND ADENOIDECTOMY  1997  . 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 UVULOPALATOPHARYNGOPLASTY  1997   Dr.Woliki    Current Outpatient Prescriptions  Medication Sig Dispense Refill  . acetaminophen (TYLENOL) 500 MG chewable tablet Chew 500 mg by mouth every 6 (six) hours as needed for pain.    Marland Kitchen acyclovir (ZOVIRAX) 200 MG capsule Take 2 capsules (400 mg total) by mouth 2 (two) times daily. (Patient taking differently: Take 400 mg by mouth daily as needed. )  3  . amLODipine (NORVASC) 5 MG tablet Take 5 mg by mouth daily.    Marland Kitchen apixaban (ELIQUIS) 5 MG TABS tablet Take 1 tablet (5 mg total) by mouth 2 (two) times daily. 14 tablet 3  . Ascorbic Acid (VITAMIN C) 1000 MG tablet Take 1,000 mg by mouth daily.      Marland Kitchen dofetilide (TIKOSYN) 500 MCG capsule TAKE 1 CAPSULE TWICE DAILY 180 capsule 1  . GLUCOSAMINE SULFATE PO Take 2 tablets by mouth daily. Will start back med in January 2017    . metoprolol tartrate (LOPRESSOR) 25 MG tablet Take 0.5 tablets (12.5 mg total) by mouth 2 (two) times daily. 90 tablet 3  . Multiple Vitamin (MULTIVITAMIN) tablet Take 1 tablet by mouth  daily.     . Multiple Vitamins-Minerals (PRESERVISION AREDS 2 PO) Take 1 tablet by mouth 2 (two) times daily.    . potassium chloride SA (K-DUR,KLOR-CON) 20 MEQ tablet Take 40 mEq by mouth 2 (two) times daily.    . pravastatin (PRAVACHOL) 20 MG tablet Take 20 mg by mouth daily.     No current facility-administered medications for this encounter.     No Known Allergies  Social History   Social History  . Marital status: Married    Spouse name: N/A  . Number of children: N/A  . Years of education: N/A   Occupational History  . Not on file.   Social History Main Topics  . Smoking status: Former Smoker    Packs/day: 1.00    Years: 7.00    Types:  Cigarettes  . Smokeless tobacco: Never Used     Comment: smoked cigarettes age [age], up to 1ppd  . Alcohol use 1.2 oz/week    1 Glasses of wine, 1 Cans of beer per week     Comment: less than 1 drink per week  . Drug use: No  . Sexual activity: Yes   Other Topics Concern  . Not on file   Social History Narrative  . No narrative on file    Family History  Problem Relation Age of Onset  . Prostate cancer Father   . Hypertension Father   . Hypertension Mother   . Prostate cancer Brother   . Melanoma Sister   . Atrial fibrillation Brother   . Melanoma Brother   . Lupus Sister   . Diabetes Neg Hx   . Heart disease Neg Hx   . Stroke Neg Hx   . Colon cancer Neg Hx     ROS- All systems are reviewed and negative except as per the HPI above  Physical Exam: Vitals:   08/04/16 1056  BP: (!) 146/94  Pulse: (!) 59  Weight: 192 lb 12.8 oz (87.5 kg)  Height: 5\' 8"  (1.727 m)    GEN- The patient is well appearing, alert and oriented x 3 today.   Head- normocephalic, atraumatic Eyes-  Sclera clear, conjunctiva pink Ears- hearing intact Oropharynx- clear Neck- supple, no JVP Lymph- no cervical lymphadenopathy Lungs- Clear to ausculation bilaterally, normal work of breathing Heart- Slow,regular rate and rhythm, no murmurs, rubs or gallops, PMI not laterally displaced GI- soft, NT, ND, + BS Extremities- no clubbing, cyanosis, or edema MS- no significant deformity or atrophy Skin- no rash or lesion Psych- euthymic mood, full affect Neuro- strength and sensation are intact  EKG- Sinus rhythm at 59 bpm,  LAD, RBBB,  pr int 134 ms, qrs int 134 ms, qtc 448 ms(stable).  Epic records reviewed.  Assessment and Plan: 1. afib Continue on tikosyn 500 mg bid, qtc stable Continue apixaban, no bleeding issues Bmet/mag  2.OSA F/u with Dr. Thurston Hole for sleep apnea Still wearing mouthpiece  3. HTN  Stable Avoid salt  4. Asymptomatic brady Improved  with reduction of  metoprolol   F/u in 4-5 months in afib clinic Has been very stable on [employer] C. Noah Lembke, [facility] [address] [place],  [address] [phone]

## 2016-08-11 ENCOUNTER — Telehealth: Payer: Self-pay | Admitting: *Deleted

## 2016-08-11 DIAGNOSIS — G4733 Obstructive sleep apnea (adult) (pediatric): Secondary | ICD-10-CM

## 2016-08-11 NOTE — Telephone Encounter (Addendum)
Called the patient to inform him of his upcoming oral device titration On Thursday June 14th  with Dr. Toy Cookey at [facility] sleep lab. He verbalized understanding and agreed to the treatment. He understands he will receive a letter in the mail from the [facility] sleep lab with a time and a date for his study. Awaiting confirmation from Dr. Betsey Holiday office for PSG. Sandie from Dr. Betsey Holiday office called to confirm the date of June 14th for PSG scheduling.

## 2016-08-11 NOTE — Telephone Encounter (Signed)
Per Dr. Radford Pax in lab PSG study ordered for oral device titration by Dr. Toy Cookey

## 2016-08-15 ENCOUNTER — Other Ambulatory Visit: Payer: Self-pay | Admitting: Internal Medicine

## 2016-08-24 NOTE — Progress Notes (Signed)
Subjective:    Patient ID: Daniel Tucker, male    DOB: December 18, 1940, 76 y.o.   MRN: [id number]  HPI He is here for follow up.  He has a wellness visit scheduled later this month.   Afib, Hypertension: He is taking his medication daily. He is compliant with a low sodium diet.  He denies chest pain, palpitations, edema, shortness of breath and regular headaches. He is exercising regularly - yard work, walking.  He does monitor his blood pressure at home and it is 140/80.    Hyperlipidemia: He is taking his medication daily. He is compliant with a low fat/cholesterol diet. He is exercising regularly. He denies myalgias.   Prediabetes:  He is compliant with a low sugar/carbohydrate diet.  He is exercising regularly.   Medications and allergies reviewed with patient and updated if appropriate.  Patient Active Problem List   Diagnosis Date Noted  . Prediabetes 09/05/2015  . 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 Arthritis of right hip 04/02/2015  . Atrial fibrillation ([place]) 08/24/2014  . Obstructive sleep apnea 07/28/2012  . Nonspecific abnormal electrocardiogram (ECG) (EKG) 05/15/2011  . Hyperlipidemia 10/10/2008  . Essential hypertension 10/10/2008  . History of colonic polyps 10/10/2008  . 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 acetaminophen (TYLENOL) 500 MG chewable tablet Chew 500 mg by mouth every 6 (six) hours as needed for pain.    Marland Kitchen acyclovir (ZOVIRAX) 200 MG capsule Take 2 capsules (400 mg total) by mouth 2 (two) times daily. (Patient taking differently: Take 400 mg by mouth daily as needed. )  3  . apixaban (ELIQUIS) 5 MG TABS tablet Take 1 tablet (5 mg total) by mouth 2 (two) times daily. 14 tablet 3  . Ascorbic Acid (VITAMIN C) 1000 MG tablet Take 1,000 mg by mouth daily.      Marland Kitchen dofetilide (TIKOSYN) 500 MCG capsule TAKE 1 CAPSULE TWICE DAILY 180 capsule 1  .  GLUCOSAMINE SULFATE PO Take 2 tablets by mouth daily. Will start back med in January 2017    . metoprolol tartrate (LOPRESSOR) 25 MG tablet Take 0.5 tablets (12.5 mg total) by mouth 2 (two) times daily. 90 tablet 3  . Multiple Vitamin (MULTIVITAMIN) tablet Take 1 tablet by mouth daily.     . Multiple Vitamins-Minerals (PRESERVISION AREDS 2 PO) Take 1 tablet by mouth 2 (two) times daily.    . potassium chloride SA (K-DUR,KLOR-CON) 20 MEQ tablet Take 40 mEq by mouth 2 (two) times daily.     No current facility-administered medications on file prior to visit.     Past Medical History:  Diagnosis Date  . Arthritis    "hips" (11/20/2014)  . Benign essential HTN   . Fasting hyperglycemia 2006   FBS 111; normal A1c  . Hepatitis ~ 1955   "yellow juandice"  . Hyperlipidemia   . Persistent atrial fibrillation ([place]) 08/24/2014   CHASD2VASC score is 3 - on Apixaban  . Sleep apnea    intolerant to CPAP and now using oral device followed by Dr. Toy Cookey  . Tubular adenoma of colon 2006   Dr Fuller Plan    Past Surgical History:  Procedure Laterality Date  . CARDIOVERSION N/A 10/12/2014   Procedure: CARDIOVERSION;  Surgeon: Darlin Coco, MD;  Location: [facility] ENDOSCOPY;  Service: Cardiovascular;  Laterality: N/A;  . COLONOSCOPY  2011   no polyp  . COLONOSCOPY W/ POLYPECTOMY  2006   [place]  . NASAL SINUS SURGERY  1997  . PROSTATE BIOPSY  2010   Negative,Dr.Ottelin  . TONSILLECTOMY AND ADENOIDECTOMY  1997  . 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 UVULOPALATOPHARYNGOPLASTY  1997   Dr.Woliki    Social History   Social History  . Marital status: Married    Spouse name: N/A  . Number of children: N/A  . Years of education: N/A   Social History Main Topics  . Smoking status: Former Smoker    Packs/day: 1.00    Years: 7.00    Types: Cigarettes  . Smokeless tobacco: Never Used     Comment: smoked cigarettes age [age], up to 1ppd  . Alcohol use 1.2 oz/week    1 Glasses of wine,  1 Cans of beer per week     Comment: less than 1 drink per week  . Drug use: No  . Sexual activity: Yes   Other Topics Concern  . None   Social History Narrative  . None    Family History  Problem Relation Age of Onset  . Prostate cancer Father   . Hypertension Father   . Hypertension Mother   . Prostate cancer Brother   . Melanoma Sister   . Atrial fibrillation Brother   . Melanoma Brother   . Lupus Sister   . Diabetes Neg Hx   . Heart disease Neg Hx   . Stroke Neg Hx   . Colon cancer Neg Hx     Review of Systems  Constitutional: Negative for chills and fever.  Respiratory: Negative for cough, shortness of breath and wheezing.   Cardiovascular: Negative for chest pain and leg swelling.  Musculoskeletal: Positive for arthralgias. Negative for myalgias.  Neurological: Negative for light-headedness and headaches.       Objective:   Vitals:   08/25/16 0945  BP: (!) 164/96  Pulse: (!) 52  Resp: 16  Temp: 97.5 F (36.4 C)   Filed Weights   08/25/16 0945  Weight: 188 lb (85.3 kg)   Body mass index is 28.59 kg/m.  Wt Readings from Last 3 Encounters:  08/25/16 188 lb (85.3 kg)  08/04/16 192 lb 12.8 oz (87.5 kg)  06/18/16 192 lb (87.1 kg)     Physical Exam Constitutional: Appears well-developed and well-nourished. No distress.  HENT:  Head: Normocephalic and atraumatic.  Neck: Neck supple. No tracheal deviation present. No thyromegaly present.  No cervical lymphadenopathy Cardiovascular: Normal rate, regular rhythm and normal heart sounds.   No murmur heard. No carotid bruit .  No edema Pulmonary/Chest: Effort normal and breath sounds normal. No respiratory distress. No has no wheezes. No rales.  Skin: Skin is warm and dry. Not diaphoretic.  Psychiatric: Normal mood and affect. Behavior is normal.        Assessment & Plan:      See Problem List for Assessment and Plan of chronic medical problems.   FU annually, sooner if BP is not controlled

## 2016-08-24 NOTE — Patient Instructions (Addendum)
Test(s) ordered today. Your results will be released to [place] (or called to you) after review, usually within 72hours after test completion. If any changes need to be made, you will be notified at that same time.  All other Health Maintenance issues reviewed.   All recommended immunizations and age-appropriate screenings are up-to-date or discussed.  No immunizations administered today.   Medications reviewed and updated.  Changes include increasing amlodipine to 7.5 mg daily.  Monitor your BP at home and call with concerns.  Your prescription(s) have been submitted to your pharmacy. Please take as directed and contact our office if you believe you are having problem(s) with the medication(s).   Please followup in 1 year

## 2016-08-25 ENCOUNTER — Other Ambulatory Visit (INDEPENDENT_AMBULATORY_CARE_PROVIDER_SITE_OTHER): Payer: Medicare Other

## 2016-08-25 ENCOUNTER — Ambulatory Visit (INDEPENDENT_AMBULATORY_CARE_PROVIDER_SITE_OTHER): Payer: Medicare Other | Admitting: Internal Medicine

## 2016-08-25 ENCOUNTER — Encounter: Payer: Self-pay | Admitting: Internal Medicine

## 2016-08-25 VITALS — BP 162/82 | HR 52 | Temp 97.5°F | Resp 16 | Ht 68.0 in | Wt 188.0 lb

## 2016-08-25 DIAGNOSIS — R7303 Prediabetes: Secondary | ICD-10-CM | POA: Diagnosis not present

## 2016-08-25 DIAGNOSIS — I481 Persistent atrial fibrillation: Secondary | ICD-10-CM

## 2016-08-25 DIAGNOSIS — E7800 Pure hypercholesterolemia, unspecified: Secondary | ICD-10-CM

## 2016-08-25 DIAGNOSIS — I4819 Other persistent atrial fibrillation: Secondary | ICD-10-CM

## 2016-08-25 DIAGNOSIS — I10 Essential (primary) hypertension: Secondary | ICD-10-CM

## 2016-08-25 LAB — CBC WITH DIFFERENTIAL/PLATELET
BASOS ABS: 0 10*3/uL (ref 0.0–0.1)
Basophils Relative: 0.2 % (ref 0.0–3.0)
Eosinophils Absolute: 0.1 10*3/uL (ref 0.0–0.7)
Eosinophils Relative: 2.5 % (ref 0.0–5.0)
HEMATOCRIT: 45.5 % (ref 39.0–52.0)
HEMOGLOBIN: 15.4 g/dL (ref 13.0–17.0)
LYMPHS PCT: 28.4 % (ref 12.0–46.0)
Lymphs Abs: 1.5 10*3/uL (ref 0.7–4.0)
MCHC: 33.9 g/dL (ref 30.0–36.0)
MCV: 95.9 fl (ref 78.0–100.0)
Monocytes Absolute: 0.6 10*3/uL (ref 0.1–1.0)
Monocytes Relative: 11.5 % (ref 3.0–12.0)
Neutro Abs: 3.1 10*3/uL (ref 1.4–7.7)
Neutrophils Relative %: 57.4 % (ref 43.0–77.0)
Platelets: 213 10*3/uL (ref 150.0–400.0)
RBC: 4.74 Mil/uL (ref 4.22–5.81)
RDW: 12.5 % (ref 11.5–15.5)
WBC: 5.4 10*3/uL (ref 4.0–10.5)

## 2016-08-25 LAB — HEPATIC FUNCTION PANEL
ALK PHOS: 64 U/L (ref 39–117)
ALT: 14 U/L (ref 0–53)
AST: 16 U/L (ref 0–37)
Albumin: 4.6 g/dL (ref 3.5–5.2)
BILIRUBIN DIRECT: 0.2 mg/dL (ref 0.0–0.3)
TOTAL PROTEIN: 7.3 g/dL (ref 6.0–8.3)
Total Bilirubin: 0.8 mg/dL (ref 0.2–1.2)

## 2016-08-25 LAB — LIPID PANEL
CHOL/HDL RATIO: 3
CHOLESTEROL: 149 mg/dL (ref 0–200)
HDL: 48.5 mg/dL (ref >39.00)
LDL Cholesterol: 76 mg/dL (ref 0–99)
NonHDL: 100.55
TRIGLYCERIDES: 121 mg/dL (ref 0.0–149.0)
VLDL: 24.2 mg/dL (ref 0.0–40.0)

## 2016-08-25 LAB — HEMOGLOBIN A1C: HEMOGLOBIN A1C: 5.9 % (ref 4.6–6.5)

## 2016-08-25 LAB — TSH: TSH: 1.18 u[IU]/mL (ref 0.35–4.50)

## 2016-08-25 MED ORDER — AMLODIPINE BESYLATE 5 MG PO TABS: 7.5000 mg | ORAL_TABLET | Freq: Every day | ORAL | 3 refills | Status: DC

## 2016-08-25 MED ORDER — PRAVASTATIN SODIUM 20 MG PO TABS: 20.0000 mg | ORAL_TABLET | Freq: Every day | ORAL | 3 refills | Status: DC

## 2016-08-25 NOTE — Assessment & Plan Note (Signed)
Well controlled Asymptomatic Management per cardiology On eliquis, tikosyn, metoprolol cbc

## 2016-08-25 NOTE — Progress Notes (Signed)
Pre visit review using our clinic review tool, if applicable. No additional management support is needed unless otherwise documented below in the visit note. 

## 2016-08-25 NOTE — Assessment & Plan Note (Signed)
Check a1c Low sugar / carb diet Stressed regular exercise, keeping weight down  

## 2016-08-25 NOTE — Assessment & Plan Note (Signed)
BP slightly elevated here and at home Will increase amlodipine to 7.5 mg daily He will continue to monitor at home and let me know if it is not ideally controlled cmp, tsh

## 2016-08-25 NOTE — Assessment & Plan Note (Signed)
Check lipid panel  Continue daily statin Regular exercise and healthy diet encouraged  

## 2016-09-01 ENCOUNTER — Other Ambulatory Visit: Payer: Self-pay | Admitting: Internal Medicine

## 2016-09-04 NOTE — Progress Notes (Addendum)
Subjective:   Daniel Tucker is a 76 y.o. male who presents for Medicare Annual/Subsequent preventive examination.  Review of Systems:  No ROS.  Medicare Wellness Visit.  Cardiac Risk Factors include: advanced age (>58men, >67 women);dyslipidemia;male gender Sleep patterns: has difficulty falling asleep, feels rested on waking, gets up 1-2 times nightly to void and sleeps 7 hours nightly.   Home Safety/Smoke Alarms: Feels safe in home. Smoke alarms in place.    Living environment; residence and Firearm Safety: 2-story house, no firearms. Lives with wife, no needs for DME at this time Seat Belt Safety/Bike Helmet: Wears seat belt.   Counseling:   Eye Exam- appointment yearly Dental- appointment every 6 months  Male:   CCS-  Last 08/02/15, precancerous polyp, recall 5 years   PSA-  Lab Results  Component Value Date   PSA 0.43 09/05/2015   PSA 0.42 08/24/2014   PSA 0.47 08/17/2013       Objective:    Vitals: BP (!) 142/78   Pulse (!) 58   Resp 20   Ht 5\' 8"  (1.727 m)   Wt 188 lb (85.3 kg)   SpO2 98%   BMI 28.59 kg/m   Body mass index is 28.59 kg/m.  Tobacco History  Smoking Status  . Former Smoker  . Packs/day: 1.00  . Years: 7.00  . Types: Cigarettes  Smokeless Tobacco  . Never Used    Comment: smoked cigarettes age [age], up to 1ppd     Counseling given: Not Answered   Past Medical History:  Diagnosis Date  . Arthritis    "hips" (11/20/2014)  . Benign essential HTN   . Fasting hyperglycemia 2006   FBS 111; normal A1c  . Hepatitis ~ 1955   "yellow juandice"  . Hyperlipidemia   . Persistent atrial fibrillation ([place]) 08/24/2014   CHASD2VASC score is 3 - on Apixaban  . Sleep apnea    intolerant to CPAP and now using oral device followed by Dr. Toy Cookey  . Tubular adenoma of colon 2006   Dr Fuller Plan   Past Surgical History:  Procedure Laterality Date  . CARDIOVERSION N/A 10/12/2014   Procedure: CARDIOVERSION;  Surgeon: Darlin Coco, MD;  Location:  [facility] ENDOSCOPY;  Service: Cardiovascular;  Laterality: N/A;  . COLONOSCOPY  2011   no polyp  . COLONOSCOPY W/ POLYPECTOMY  2006   [place]  . NASAL SINUS SURGERY  1997  . PROSTATE BIOPSY  2010   Negative,Dr.Ottelin  . TONSILLECTOMY AND ADENOIDECTOMY  1997  . 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 UVULOPALATOPHARYNGOPLASTY  1997   Dr.Woliki   Family History  Problem Relation Age of Onset  . Prostate cancer Father   . Hypertension Father   . Hypertension Mother   . Prostate cancer Brother   . Melanoma Sister   . Atrial fibrillation Brother   . Melanoma Brother   . Lupus Sister   . Diabetes Neg Hx   . Heart disease Neg Hx   . Stroke Neg Hx   . Colon cancer Neg Hx    History  Sexual Activity  . Sexual activity: Yes    Outpatient Encounter Prescriptions as of 09/05/2016  Medication Sig  . acetaminophen (TYLENOL) 500 MG chewable tablet Chew 500 mg by mouth every 6 (six) hours as needed for pain.  Marland Kitchen acyclovir (ZOVIRAX) 200 MG capsule TAKE 2 CAPSULES EVERY DAY AS DIRECTED  . amLODipine (NORVASC) 5 MG tablet Take 1.5 tablets (7.5 mg total) by mouth daily.  Marland Kitchen  apixaban (ELIQUIS) 5 MG TABS tablet Take 1 tablet (5 mg total) by mouth 2 (two) times daily.  . Ascorbic Acid (VITAMIN C) 1000 MG tablet Take 1,000 mg by mouth daily.    Marland Kitchen dofetilide (TIKOSYN) 500 MCG capsule TAKE 1 CAPSULE TWICE DAILY  . GLUCOSAMINE SULFATE PO Take 2 tablets by mouth daily. Will start back med in January 2017  . metoprolol tartrate (LOPRESSOR) 25 MG tablet Take 0.5 tablets (12.5 mg total) by mouth 2 (two) times daily.  . Multiple Vitamin (MULTIVITAMIN) tablet Take 1 tablet by mouth daily.   . Multiple Vitamins-Minerals (PRESERVISION AREDS 2 PO) Take 1 tablet by mouth 2 (two) times daily.  . potassium chloride SA (K-DUR,KLOR-CON) 20 MEQ tablet Take 40 mEq by mouth 2 (two) times daily.  . pravastatin (PRAVACHOL) 20 MG tablet Take 1 tablet (20 mg total) by mouth daily.   No facility-administered  encounter medications on file as of 09/05/2016.     Activities of Daily Living In your present state of health, do you have any difficulty performing the following activities: 09/05/2016  Hearing? N  Vision? N  Difficulty concentrating or making decisions? N  Walking or climbing stairs? N  Dressing or bathing? N  Preparing Food and eating ? N  Using the Toilet? N  In the past six months, have you accidently leaked urine? N  Do you have problems with loss of bowel control? N  Managing your Medications? N  Managing your Finances? N  Housekeeping or managing your Housekeeping? N  Some recent data might be hidden    Patient Care Team: Binnie Rail, MD as PCP - General (Internal Medicine) Gaynelle Arabian, MD as Consulting Physician (Orthopedic Surgery) Lelon Perla, MD as Consulting Physician (Cardiology) Freada Bergeron, CMA as Technician Melida Quitter, MD as Consulting Physician (Otolaryngology) Ladene Artist, MD as Consulting Physician (Gastroenterology)   Assessment:    Physical assessment deferred to PCP.  Exercise Activities and Dietary recommendations Current Exercise Habits: The patient has a physically strenous job, but has no regular exercise apart from work. 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   Goals    . Continue with my home projects, enjoy life, family, and church      Fall Risk Fall Risk  09/05/2016 03/28/2016 08/24/2014 07/28/2012  Falls in the past year? Yes No No No  Injury with Fall? No - - -   Depression Screen PHQ 2/9 Scores 09/05/2016 08/24/2014 07/28/2012  PHQ - 2 Score 0 0 0    Cognitive  Function       Ad8 score reviewed for issues:  Issues making decisions: no  Less interest in hobbies / activities:no  Repeats questions, stories (family complaining): no  Trouble using ordinary gadgets (microwave, computer, phone):no  forgets the month or year: no  Mismanaging finances: no  Remembering appts: no  Daily problems with thinking and/or memory: no Ad8 score is= 0     Immunization History  Administered Date(s) Administered  . Influenza Split 03/18/2011  . Influenza Whole 01/14/2010  . Influenza, High Dose Seasonal PF 02/15/2013, 01/26/2014, 02/16/2015, 01/16/2016  . Pneumococcal Conjugate-13 08/24/2014  . Pneumococcal Polysaccharide-23 02/04/2010  . Td 11/08/2009  Screening Tests Health Maintenance  Topic Date Due  . INFLUENZA VACCINE  11/19/2016  . TETANUS/TDAP  11/09/2019  . COLONOSCOPY  08/01/2020  . PNA vac Low Risk Adult  Completed      Plan:    Continue to eat heart healthy diet (full of fruits, vegetables, whole grains, lean protein, water--limit salt, fat, and sugar intake) and increase physical activity as tolerated.  Continue doing brain stimulating activities (puzzles, reading, adult coloring books, staying active) to keep memory sharp.   I have personally reviewed and noted the following in the patient's chart:   . Medical and social history . Use of alcohol, tobacco or illicit drugs  . Current medications and supplements . Functional ability and status . Nutritional status . Physical activity . Advanced directives . List of other physicians . Vitals . Screenings to include cognitive, depression, and falls . Referrals and appointments  In addition, I have reviewed and discussed with patient certain preventive protocols, quality metrics, and best practice recommendations. A written personalized care plan for preventive services as well as general preventive health recommendations were provided to patient.     Michiel Cowboy,  RN  09/05/2016    Medical screening examination/treatment/procedure(s) were performed by non-physician practitioner and as supervising physician I was immediately available for consultation/collaboration. I agree with above. Binnie Rail, MD

## 2016-09-04 NOTE — Progress Notes (Signed)
Pre visit review using our clinic review tool, if applicable. No additional management support is needed unless otherwise documented below in the visit note. 

## 2016-09-05 ENCOUNTER — Ambulatory Visit (INDEPENDENT_AMBULATORY_CARE_PROVIDER_SITE_OTHER): Payer: Medicare Other | Admitting: *Deleted

## 2016-09-05 VITALS — BP 142/78 | HR 58 | Resp 20 | Ht 68.0 in | Wt 188.0 lb

## 2016-09-05 DIAGNOSIS — Z0000 Encounter for general adult medical examination without abnormal findings: Secondary | ICD-10-CM | POA: Diagnosis not present

## 2016-09-05 NOTE — Patient Instructions (Signed)
Continue to eat heart healthy diet (full of fruits, vegetables, whole grains, lean protein, water--limit salt, fat, and sugar intake) and increase physical activity as tolerated.  Continue doing brain stimulating activities (puzzles, reading, adult coloring books, staying active) to keep memory sharp.    Daniel Tucker , Thank you for taking time to come for your Medicare Wellness Visit. I appreciate your ongoing commitment to your health goals. Please review the following plan we discussed and let me know if I can assist you in the future.   These are the goals we discussed: Goals    . Continue with my home projects, enjoy life, family, and church       This is a list of the screening recommended for you and due dates:  Health Maintenance  Topic Date Due  . Flu Shot  11/19/2016  . Tetanus Vaccine  11/09/2019  . Colon Cancer Screening  08/01/2020  . Pneumonia vaccines  Completed

## 2016-09-17 ENCOUNTER — Other Ambulatory Visit: Payer: Self-pay | Admitting: Internal Medicine

## 2016-09-25 DIAGNOSIS — M1611 Unilateral primary osteoarthritis, right hip: Secondary | ICD-10-CM | POA: Diagnosis not present

## 2016-10-02 ENCOUNTER — Ambulatory Visit (HOSPITAL_BASED_OUTPATIENT_CLINIC_OR_DEPARTMENT_OTHER): Payer: Medicare Other | Attending: Cardiology | Admitting: Cardiology

## 2016-10-02 DIAGNOSIS — G4733 Obstructive sleep apnea (adult) (pediatric): Secondary | ICD-10-CM

## 2016-10-02 DIAGNOSIS — I471 Supraventricular tachycardia: Secondary | ICD-10-CM | POA: Diagnosis not present

## 2016-10-08 ENCOUNTER — Telehealth: Payer: Self-pay | Admitting: *Deleted

## 2016-10-08 NOTE — Telephone Encounter (Signed)
Patient's sleep study will be sent once it is resulted by Dr Radford Pax.

## 2016-10-08 NOTE — Telephone Encounter (Signed)
-----   Message from Sueanne Margarita, MD sent at 10/08/2016  3:33 PM EDT ----- I cant result it because it has not been scored for me to read yet  Traci ----- Message ----- From: Freada Bergeron, CMA Sent: 10/08/2016   2:46 PM To: Sueanne Margarita, MD  Hello Dr Radford Pax could you result this sleep study, Dr Toy Cookey would like a copy. Thanks

## 2016-10-19 NOTE — Procedures (Addendum)
   Patient Name: Daniel Tucker, Daniel Tucker Date: 10/02/2016 Gender: Male D.O.B: 1940/06/22 Age (years): 22 Referring Provider: Fransico Him MD, ABSM Height (inches): 69 Interpreting Physician: Fransico Him MD, ABSM Weight (lbs): 190 RPSGT: Laren Everts BMI: 28 MRN: [id number] Neck Size: 16.00  CLINICAL INFORMATION Sleep Study Type: NPSG  Indication for sleep study: Atrial Fibrillation, Establised OSA, Evauate Oral Appliance, Hypertension, OSA, Restless Sleep, Witnessed Apneas  Epworth Sleepiness Score: 2  Most recent polysomnogram dated 12/01/2014 revealed an AHI of 12.3/h and RDI of 12.7/h.  SLEEP STUDY TECHNIQUE As per the AASM Manual for the Scoring of Sleep and Associated Events v2.3 (April 2016) with a hypopnea requiring 4% desaturations.  The channels recorded and monitored were frontal, central and occipital EEG, electrooculogram (EOG), submentalis EMG (chin), nasal and oral airflow, thoracic and abdominal wall motion, anterior tibialis EMG, snore microphone, electrocardiogram, and pulse oximetry.  MEDICATIONS Medications self-administered by patient taken the night of the study : N/A  SLEEP ARCHITECTURE The study was initiated at 10:14:47 PM and ended at 5:26:44 AM.  Sleep onset time was 23.9 minutes and the sleep efficiency was 66.6%. The total sleep time was 287.5 minutes.  Stage REM latency was 134.5 minutes.  The patient spent 9.22% of the night in stage N1 sleep, 70.26% in stage N2 sleep, 0.00% in stage N3 and 20.52% in REM.  Alpha intrusion was absent.  Supine sleep was 5.46%.  RESPIRATORY PARAMETERS The overall apnea/hypopnea index (AHI) was 7.9 per hour. There were 5 total apneas, including 5 obstructive, 0 central and 0 mixed apneas. There were 33 hypopneas and 101 RERAs.  The AHI during Stage REM sleep was 11.2 per hour.  AHI while supine was 19.1 per hour.  The mean oxygen saturation was 94.14%. The minimum SpO2 during sleep was 85.00%.  Soft  snoring was noted during this study.  CARDIAC DATA The 2 lead EKG demonstrated sinus rhythm. The mean heart rate was 53.46 beats per minute. Other EKG findings include:frequent PACs and nonsustained atrial tachycardia..  LEG MOVEMENT DATA The total PLMS were 206 with a resulting PLMS index of 42.99. Associated arousal with leg movement index was 15.9 .  IMPRESSIONS - Mild obstructive sleep apnea occurred during this study (AHI = 7.9/h) in the setting of oral device titration. - No significant central sleep apnea occurred during this study (CAI = 0.0/h). - Mild oxygen desaturation was noted during this study (Min O2 = 85.00%). - The patient snored with Soft snoring volume. - No cardiac abnormalities were noted during this study. 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 Associated arousals were significant.  DIAGNOSIS - Obstructive Sleep Apnea (327.23 [G47.33 ICD-10]) -  Nonsustained atrial tachycardia  RECOMMENDATIONS - Positional therapy avoiding supine position during sleep. - Very mild obstructive sleep apnea mainly during supine sleep. - Avoid alcohol, sedatives and other CNS depressants that may worsen sleep apnea and disrupt normal sleep architecture. - Sleep hygiene should be reviewed to assess factors that may improve sleep quality. - Weight management and regular exercise should be initiated or continued if appropriate. - Further oral device management per Dr. Toy Cookey. - Recommend repeat home sleep study with patient in the nonsupine position.  [place], American Board of Sleep Medicine  ELECTRONICALLY SIGNED ON:  10/19/2016, 11:40 PM [place] PH: [phone]   FX: [phone] [employer]

## 2016-10-20 NOTE — Telephone Encounter (Signed)
-----   Message from Sueanne Margarita, MD sent at 10/19/2016 11:44 PM EDT ----- Please let patient know that he still has an elevated AHI but improved and worse mainly when sleeping supine.  Please encourage patient to avoid sleeping supine and get a home sleep study with patient sleeping on his side.

## 2016-10-20 NOTE — Telephone Encounter (Signed)
LMTCB

## 2016-10-21 NOTE — Telephone Encounter (Addendum)
ESS= 4  Informed patient of sleep study results and patient understanding was verbalized. Patient understands his apnea events are worse when he sleeps on his back. Patient has been encouraged to avoid sleeping on his back. Patient understands Dr Radford Pax has recommended a Home Sleep Study with him sleeping on his side  Informed patient of  home sleep study and patient understanding was verbalized. Patient understands he will be contacted by [facility]to set up his sleep study. He understands to call if [facility] does not contact him with  setup in a timely manner. He understands he will be called once confirmation has been received from [facility] that he has completed his testing to schedule 10 week follow up appointment. All records needed have been faxed to [facility]  He was grateful for the call and thanked me

## 2016-10-21 NOTE — Telephone Encounter (Deleted)
-----   Message from Sueanne Margarita, MD sent at 10/19/2016 11:44 PM EDT ----- Please let patient know that he still has an elevated AHI but improved and worse mainly when sleeping supine.  Please encourage patient to avoid sleeping supine and get a home sleep study with patient sleeping on his side.

## 2016-11-02 ENCOUNTER — Encounter: Payer: Self-pay | Admitting: Cardiology

## 2016-11-02 DIAGNOSIS — G4733 Obstructive sleep apnea (adult) (pediatric): Secondary | ICD-10-CM | POA: Diagnosis not present

## 2016-11-03 DIAGNOSIS — G4733 Obstructive sleep apnea (adult) (pediatric): Secondary | ICD-10-CM | POA: Diagnosis not present

## 2016-11-06 DIAGNOSIS — G4733 Obstructive sleep apnea (adult) (pediatric): Secondary | ICD-10-CM | POA: Diagnosis not present

## 2016-11-12 ENCOUNTER — Telehealth: Payer: Self-pay | Admitting: *Deleted

## 2016-11-12 DIAGNOSIS — H2513 Age-related nuclear cataract, bilateral: Secondary | ICD-10-CM | POA: Diagnosis not present

## 2016-11-12 DIAGNOSIS — G4733 Obstructive sleep apnea (adult) (pediatric): Secondary | ICD-10-CM

## 2016-11-12 NOTE — Telephone Encounter (Signed)
-----   Message from Sueanne Margarita, MD sent at 11/10/2016  7:57 PM EDT ----- Moderate to severe OSA with AHI 25-29/hr.  Recommend in lab CPAP titration

## 2016-11-12 NOTE — Telephone Encounter (Signed)
Yes, patient states his was wearing his oral device during his test

## 2016-11-12 NOTE — Telephone Encounter (Addendum)
Informed patient of sleep study results and patient understanding was verbalized. Patient understands he stopped breathing 25-29/hr. Patient understands Dr Radford Pax recommends a CPAP titration. Patient understands his sleep study is scheduled for Wednesday December 24, 2016. Patient understands his titration will be done at [facility] sleep lab. Patient understands he will receive a sleep packet in a week or so. Patient understands to call if he does not receive the sleep packet in a timely manner. Patient agrees with treatment and thanked me for call

## 2016-11-12 NOTE — Telephone Encounter (Signed)
-----   Message from Sueanne Margarita, MD sent at 11/10/2016  7:57 PM EDT ----- Please find out if this sleep study was done using his oral device

## 2016-11-13 ENCOUNTER — Encounter: Payer: Self-pay | Admitting: *Deleted

## 2016-11-17 ENCOUNTER — Other Ambulatory Visit (HOSPITAL_COMMUNITY): Payer: Self-pay | Admitting: Nurse Practitioner

## 2016-11-17 DIAGNOSIS — I480 Paroxysmal atrial fibrillation: Secondary | ICD-10-CM

## 2016-12-08 ENCOUNTER — Ambulatory Visit (HOSPITAL_COMMUNITY)
Admission: RE | Admit: 2016-12-08 | Discharge: 2016-12-08 | Disposition: A | Discharge status: Home / Self Care | Payer: Medicare Other | Source: Ambulatory Visit | Attending: Nurse Practitioner | Admitting: Nurse Practitioner

## 2016-12-08 ENCOUNTER — Encounter (HOSPITAL_COMMUNITY): Payer: Self-pay | Admitting: Nurse Practitioner

## 2016-12-08 VITALS — BP 150/76 | HR 54 | Ht 69.0 in | Wt 187.0 lb

## 2016-12-08 DIAGNOSIS — Z8042 Family history of malignant neoplasm of prostate: Secondary | ICD-10-CM | POA: Diagnosis not present

## 2016-12-08 DIAGNOSIS — I481 Persistent atrial fibrillation: Secondary | ICD-10-CM | POA: Diagnosis not present

## 2016-12-08 DIAGNOSIS — Z8249 Family history of ischemic heart disease and other diseases of the circulatory system: Secondary | ICD-10-CM | POA: Insufficient documentation

## 2016-12-08 DIAGNOSIS — I10 Essential (primary) hypertension: Secondary | ICD-10-CM | POA: Diagnosis not present

## 2016-12-08 DIAGNOSIS — Z79899 Other long term (current) drug therapy: Secondary | ICD-10-CM | POA: Insufficient documentation

## 2016-12-08 DIAGNOSIS — E785 Hyperlipidemia, unspecified: Secondary | ICD-10-CM | POA: Diagnosis not present

## 2016-12-08 DIAGNOSIS — Z87891 Personal history of nicotine dependence: Secondary | ICD-10-CM | POA: Diagnosis not present

## 2016-12-08 DIAGNOSIS — G4733 Obstructive sleep apnea (adult) (pediatric): Secondary | ICD-10-CM | POA: Diagnosis not present

## 2016-12-08 DIAGNOSIS — I480 Paroxysmal atrial fibrillation: Secondary | ICD-10-CM | POA: Diagnosis not present

## 2016-12-08 LAB — BASIC METABOLIC PANEL
ANION GAP: 6 (ref 5–15)
BUN: 15 mg/dL (ref 6–20)
CO2: 27 mmol/L (ref 22–32)
Calcium: 9.3 mg/dL (ref 8.9–10.3)
Chloride: 105 mmol/L (ref 101–111)
Creatinine, Ser: 1.05 mg/dL (ref 0.61–1.24)
GFR calc Af Amer: >60 mL/min (ref >60)
Glucose, Bld: 96 mg/dL (ref 65–99)
POTASSIUM: 4.2 mmol/L (ref 3.5–5.1)
SODIUM: 138 mmol/L (ref 135–145)

## 2016-12-08 LAB — MAGNESIUM: MAGNESIUM: 2.2 mg/dL (ref 1.7–2.4)

## 2016-12-08 NOTE — Progress Notes (Signed)
Patient ID: Daniel Tucker, male   DOB: Oct 14, 1940, 76 y.o.   MRN: [id number]     Primary Care Physician: Binnie Rail, MD Referring Physician: Dr. Jovita Kussmaul is a 76 y.o. male with a h/o persistent  afib maintaining SR on tikosyn. He reports that he feels well.No issues with blood thinner. Not aware of any afib. Using mouth piece for sleep apnea followed thru dentist, Dr. Toy Cookey.  Returns 7/11 and feels well. No issues with afib. Being compliant with tikosyn and apixaban. No afib issues. He is on slow side at 49 bpm, will try to decrease metoprolol to 50 1/2 tab a day.  F/u afib clinic 10/16 and feels well. No afib with tikosyn. Qtc stable.  Afib clinic 1/18, no afib occurence that he is aware. Qtc stable.  F/u afib clinic 4/16, he continues on tikosyn and feels well. No breakthrough afib. No bleeding issues with eliquis. He continues to wear a mouth piece for sleep apnea followed by Dr. Radford Pax, recently re evaluated with a home sleep study.  F/u in afib clinic for surveillance of tikosyn. QTc is stable, no awareness of afib. Has been on tikosyn now for around 2 years and labs, QTc very stable. Very compliant pt.  Today, he denies symptoms of palpitations, chest pain, shortness of breath, orthopnea, PND, lower extremity edema, dizziness, presyncope, syncope, or neurologic sequela. The patient is tolerating medications without difficulties and is otherwise without complaint today.   Past Medical History:  Diagnosis Date  . Arthritis    "hips" (11/20/2014)  . Benign essential HTN   . Fasting hyperglycemia 2006   FBS 111; normal A1c  . Hepatitis ~ 1955   "yellow juandice"  . Hyperlipidemia   . Persistent atrial fibrillation ([place]) 08/24/2014   CHASD2VASC score is 3 - on Apixaban  . Sleep apnea    intolerant to CPAP and now using oral device followed by Dr. Toy Cookey  . Tubular adenoma of colon 2006   Dr Fuller Plan   Past Surgical History:  Procedure Laterality Date  .  CARDIOVERSION N/A 10/12/2014   Procedure: CARDIOVERSION;  Surgeon: Darlin Coco, MD;  Location: [facility] ENDOSCOPY;  Service: Cardiovascular;  Laterality: N/A;  . COLONOSCOPY  2011   no polyp  . COLONOSCOPY W/ POLYPECTOMY  2006   [place]  . NASAL SINUS SURGERY  1997  . PROSTATE BIOPSY  2010   Negative,Dr.Ottelin  . TONSILLECTOMY AND ADENOIDECTOMY  1997  . 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 UVULOPALATOPHARYNGOPLASTY  1997   Dr.Woliki    Current Outpatient Prescriptions  Medication Sig Dispense Refill  . acetaminophen (TYLENOL) 500 MG chewable tablet Chew 500 mg by mouth every 6 (six) hours as needed for pain.    Marland Kitchen acyclovir (ZOVIRAX) 200 MG capsule TAKE 2 CAPSULES EVERY DAY AS DIRECTED 180 capsule 3  . amLODipine (NORVASC) 5 MG tablet Take 1.5 tablets (7.5 mg total) by mouth daily. 135 tablet 3  . Ascorbic Acid (VITAMIN C) 1000 MG tablet Take 1,000 mg by mouth daily.      Marland Kitchen dofetilide (TIKOSYN) 500 MCG capsule TAKE 1 CAPSULE TWICE DAILY 180 capsule 1  . ELIQUIS 5 MG TABS tablet TAKE 1 TABLET (5 MG TOTAL) BY MOUTH 2 (TWO) TIMES DAILY. 180 tablet 3  . GLUCOSAMINE SULFATE PO Take 2 tablets by mouth daily. Will start back med in January 2017    . metoprolol tartrate (LOPRESSOR) 25 MG tablet TAKE 1/2 TABLET (12.5 MG TOTAL)  BY MOUTH 2 (TWO) TIMES DAILY. 90 tablet 3  . Multiple Vitamin (MULTIVITAMIN) tablet Take 1 tablet by mouth daily.     . Multiple Vitamins-Minerals (PRESERVISION AREDS 2 PO) Take 1 tablet by mouth 2 (two) times daily.    . potassium chloride SA (K-DUR,KLOR-CON) 20 MEQ tablet Take 40 mEq by mouth 2 (two) times daily.    . pravastatin (PRAVACHOL) 20 MG tablet Take 1 tablet (20 mg total) by mouth daily. 90 tablet 3   No current facility-administered medications for this encounter.     No Known Allergies  Social History   Social History  . Marital status: Married    Spouse name: N/A  . Number of children: N/A  . Years of education: N/A   Occupational  History  . Not on file.   Social History Main Topics  . Smoking status: Former Smoker    Packs/day: 1.00    Years: 7.00    Types: Cigarettes  . Smokeless tobacco: Never Used     Comment: smoked cigarettes age [age], up to 1ppd  . Alcohol use 1.2 oz/week    1 Glasses of wine, 1 Cans of beer per week     Comment: less than 1 drink per week  . Drug use: No  . Sexual activity: Yes   Other Topics Concern  . Not on file   Social History Narrative  . No narrative on file    Family History  Problem Relation Age of Onset  . Prostate cancer Father   . Hypertension Father   . Hypertension Mother   . Prostate cancer Brother   . Melanoma Sister   . Atrial fibrillation Brother   . Melanoma Brother   . Lupus Sister   . Diabetes Neg Hx   . Heart disease Neg Hx   . Stroke Neg Hx   . Colon cancer Neg Hx     ROS- All systems are reviewed and negative except as per the HPI above  Physical Exam: Vitals:   12/08/16 1101  BP: (!) 150/76  Pulse: (!) 54  Weight: 187 lb (84.8 kg)  Height: 5\' 9"  (1.753 m)    GEN- The patient is well appearing, alert and oriented x 3 today.   Head- normocephalic, atraumatic Eyes-  Sclera clear, conjunctiva pink Ears- hearing intact Oropharynx- clear Neck- supple, no JVP Lymph- no cervical lymphadenopathy Lungs- Clear to ausculation bilaterally, normal work of breathing Heart- Slow,regular rate and rhythm, no murmurs, rubs or gallops, PMI not laterally displaced GI- soft, NT, ND, + BS Extremities- no clubbing, cyanosis, or edema MS- no significant deformity or atrophy Skin- no rash or lesion Psych- euthymic mood, full affect Neuro- strength and sensation are intact  EKG- Sinus rhythm at 54 bpm,  LAD, RBBB,  pr int 152 ms, qrs int 132 ms, qtc 438 ms(stable).  Epic records reviewed.  Assessment and Plan: 1. afib Continue on tikosyn 500 mg bid, qtc stable Continue apixaban, no bleeding issues Bmet/mag  2.OSA F/u with Dr. Thurston Hole for  sleep apnea Still wearing mouthpiece  3. HTN  Stable Avoid salt  4. Asymptomatic brady Improved  with reduction of metoprolol   F/u in 5-6 months in afib clinic Has been very stable on [employer] [facility]. [place], [facility] [address] [place], [place] [address] [phone]

## 2016-12-23 ENCOUNTER — Other Ambulatory Visit (HOSPITAL_COMMUNITY): Payer: Self-pay | Admitting: Nurse Practitioner

## 2016-12-24 ENCOUNTER — Encounter (HOSPITAL_BASED_OUTPATIENT_CLINIC_OR_DEPARTMENT_OTHER): Payer: Medicare Other

## 2017-01-01 ENCOUNTER — Ambulatory Visit (INDEPENDENT_AMBULATORY_CARE_PROVIDER_SITE_OTHER): Payer: Medicare Other | Admitting: General Practice

## 2017-01-01 DIAGNOSIS — Z23 Encounter for immunization: Secondary | ICD-10-CM | POA: Diagnosis not present

## 2017-01-01 NOTE — Progress Notes (Signed)
Injection given.   Makynna Manocchio J Erlinda Solinger, MD  

## 2017-01-12 ENCOUNTER — Other Ambulatory Visit (HOSPITAL_COMMUNITY): Payer: Self-pay | Admitting: Nurse Practitioner

## 2017-01-21 DIAGNOSIS — M1611 Unilateral primary osteoarthritis, right hip: Secondary | ICD-10-CM | POA: Diagnosis not present

## 2017-02-05 ENCOUNTER — Ambulatory Visit (HOSPITAL_BASED_OUTPATIENT_CLINIC_OR_DEPARTMENT_OTHER): Payer: Medicare Other | Attending: Cardiology | Admitting: Cardiology

## 2017-02-05 VITALS — Ht 69.0 in | Wt 190.0 lb

## 2017-02-05 DIAGNOSIS — G4733 Obstructive sleep apnea (adult) (pediatric): Secondary | ICD-10-CM | POA: Diagnosis not present

## 2017-02-05 DIAGNOSIS — G4730 Sleep apnea, unspecified: Secondary | ICD-10-CM | POA: Diagnosis present

## 2017-02-06 DIAGNOSIS — M1611 Unilateral primary osteoarthritis, right hip: Secondary | ICD-10-CM | POA: Diagnosis not present

## 2017-02-10 ENCOUNTER — Encounter: Payer: Self-pay | Admitting: Cardiology

## 2017-02-12 ENCOUNTER — Telehealth: Payer: Self-pay | Admitting: Gastroenterology

## 2017-02-12 MED ORDER — HYDROCORTISONE ACETATE 25 MG RE SUPP: 25.0000 mg | Freq: Two times a day (BID) | RECTAL | 1 refills | Status: DC

## 2017-02-12 NOTE — Telephone Encounter (Signed)
Patient's wife reports that he had rectal bleeding this am with a BM.  Patient had a colonoscopy last year with internal hemorrhoids and was to use anusol PRN rectal bleeding.  Patient's wife is advised I called in a refill of anusol, patient to start on stool softener of Colace or Miralax daily.  They will call back if he has continued rectal bleeding.  Wife reports she understands to call back for large amount of bleeding or if the patient is passing blood independent of stool

## 2017-02-12 NOTE — Telephone Encounter (Signed)
anusol inadvertently sent to [facility] mail order pharmacy.  I spoke with Leory Plowman and cancelled the rx.

## 2017-02-15 NOTE — Procedures (Signed)
   Patient Name: Daniel Tucker, Daniel Tucker Date: 02/05/2017 Gender: Male D.O.B: 22-Jan-1941 Age (years): 56 Referring Provider: Fransico Him MD, ABSM Height (inches): 69 Interpreting Physician: Fransico Him MD, ABSM Weight (lbs): 190 RPSGT: Laren Everts BMI: 28 MRN: [id number] Neck Size: 16.00  CLINICAL INFORMATION The patient is referred for a CPAP titration to treat sleep apnea.  SLEEP STUDY TECHNIQUE As per the AASM Manual for the Scoring of Sleep and Associated Events v2.3 (April 2016) with a hypopnea requiring 4% desaturations.  The channels recorded and monitored were frontal, central and occipital EEG, electrooculogram (EOG), submentalis EMG (chin), nasal and oral airflow, thoracic and abdominal wall motion, anterior tibialis EMG, snore microphone, electrocardiogram, and pulse oximetry. Continuous positive airway pressure (CPAP) was initiated at the beginning of the study and titrated to treat sleep-disordered breathing.  MEDICATIONS Medications self-administered by patient taken the night of the study : N/A  TECHNICIAN COMMENTS Comments added by technician: Patient was restless all through the night.  Comments added by scorer: N/A  RESPIRATORY PARAMETERS Optimal PAP Pressure (cm): 15  AHI at Optimal Pressure (/hr):7.8 Overall Minimal O2 (%):90.00  Supine % at Optimal Pressure (%):15 Minimal O2 at Optimal Pressure (%): 94.0    SLEEP ARCHITECTURE The study was initiated at 10:36:55 PM and ended at 5:42:55 AM.  Sleep onset time was 6.5 minutes and the sleep efficiency was 62.2%. The total sleep time was 265.0 minutes.  The patient spent 19.62% of the night in stage N1 sleep, 63.96% in stage N2 sleep, 0.00% in stage N3 and 16.42% in REM.Stage REM latency was 143.5 minutes  Wake after sleep onset was 154.5. Alpha intrusion was absent. Supine sleep was 20.57%.  CARDIAC DATA The 2 lead EKG demonstrated NSR   The mean heart rate was 52.09 beats per minute. Other EKG  findings include: PACs and possible atrial fibrillation but significant wandering baseline artifact made interpretation difficult.  LEG MOVEMENT DATA The total Periodic Limb Movements of Sleep (PLMS) were 251. The PLMS index was 56.83. A PLMS index of <15 is considered normal in adults.  IMPRESSIONS - The optimal PAP pressure was 15 cm of water. - Central sleep apnea was not noted during this titration (CAI = 4.1/h). - Mild oxygen desaturations were observed during this titration (min O2 = 90.00%). - No snoring was audible during this study. 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 Arousals associated with PLMs were significant.  DIAGNOSIS - Obstructive Sleep Apnea (327.23 [G47.33 ICD-10])  RECOMMENDATIONS - Trial of CPAP therapy on 15 cm H2O with a Medium size Philips Respironics Full Face Mask Dreamwear mask and heated humidification. - Avoid alcohol, sedatives and other CNS depressants that may worsen sleep apnea and disrupt normal sleep architecture. - Sleep hygiene should be reviewed to assess factors that may improve sleep quality. - Weight management and regular exercise should be initiated or continued. - Return to Sleep Center for re-evaluation after 10 weeks of therapy  [place], [facility]  ELECTRONICALLY SIGNED ON:  02/15/2017, 6:29 PM [place] PH: [phone]   FX: [phone] [place]

## 2017-02-19 NOTE — Progress Notes (Signed)
The oral device did not control his OSA so that is why we proceeded with CPAP.  He needs to get his CPAP and stop using oral device since it is not working.  Then should see me in 8 weeks after starting his CPAP

## 2017-02-19 NOTE — Telephone Encounter (Signed)
Informed patient of titration results and verbalized understanding was indicated. Patient understands his CPAP Titration was successful. Patient understands Dr Radford Pax has ordered him a CPAP. Patient understands he will be contacted by [facility] to set up his cpap. He understands to call if [facility] does not contact him with new setup in a timely manner. He understands he will be called once confirmation has been received from [facility] that he has received his new machine to schedule 10 week follow up appointment.  [facility] notified of new cpap order Please add to Daniel Tucker he was grateful for the call and thanked me

## 2017-02-19 NOTE — Telephone Encounter (Signed)
TO HEART CARE BILLING DEPARTMENT: Not sure if I put the CPAP Titration in my first message but patient would like to know what his part will be for this test. Please call patient. Thanks, Gae Bon

## 2017-02-20 ENCOUNTER — Telehealth: Payer: Self-pay | Admitting: Cardiology

## 2017-02-20 NOTE — Telephone Encounter (Signed)
New message    Pt is calling stating that someone from billing called this morning about some information. He said he isn't sure if there was a name on the message but he accidentally deleted it.

## 2017-02-24 ENCOUNTER — Telehealth: Payer: Self-pay | Admitting: *Deleted

## 2017-02-24 NOTE — Telephone Encounter (Signed)
Late entry: Informed patient of titration results and verbalized understanding was indicated. Patient understands his CPAP titration was successful. Patient understands Dr Radford Pax has ordered him a CPAP in Epic but patient wants to see Dr Radford Pax as scheduled on 11/7 before agreeing to get a CPAP.

## 2017-02-24 NOTE — Telephone Encounter (Signed)
-----   Message from Sueanne Margarita, MD sent at 02/19/2017  5:37 PM EDT ----- Regarding: 6 mo sleep visit   ----- Message ----- From: Freada Bergeron, CMA Sent: 02/19/2017   3:59 PM To: Sueanne Margarita, MD Subject: 6 mo sleep visit                               Hello Dr Radford Pax, this patient has not received his CPAP and he has a 6 month sleep appointment with you on Wednesday 11/7. Our office referred him to Dr Toy Cookey for a oral device titration in April. Patient states he wears his oral device everyday. Do you still want to see him on Wednesday? Patient says he would like to see you because he has questions as to why he needs a CPAP and an oral device. Please advise, Gae Bon    ----- Message ----- From: Sueanne Margarita, MD Sent: 02/15/2017   6:32 PM To: Freada Bergeron, CMA  Please let patient know that they had a successful PAP titration and let DME know that orders are in EPIC.  Please set up 10 week OV with me.

## 2017-02-24 NOTE — Telephone Encounter (Signed)
Reached out to the patient to inform him of Dr Landis Gandy answer and the patient still insist on keeping his appointment with Dr Radford Pax on 11/7. Dr Radford Pax agreed to see the patient.

## 2017-02-24 NOTE — Telephone Encounter (Signed)
Sueanne Margarita, MD at 02/05/2017 8:00 PM   Status: Signed    The oral device did not control his OSA so that is why we proceeded with CPAP.  He needs to get his CPAP and stop using oral device since it is not working.  Then should see me in 8 weeks after starting his CPAP

## 2017-02-25 ENCOUNTER — Encounter: Payer: Self-pay | Admitting: Cardiology

## 2017-02-25 ENCOUNTER — Ambulatory Visit (INDEPENDENT_AMBULATORY_CARE_PROVIDER_SITE_OTHER): Payer: Medicare Other | Admitting: Cardiology

## 2017-02-25 VITALS — BP 150/7 | HR 67 | Ht 69.0 in | Wt 188.6 lb

## 2017-02-25 DIAGNOSIS — I10 Essential (primary) hypertension: Secondary | ICD-10-CM | POA: Diagnosis not present

## 2017-02-25 DIAGNOSIS — G4733 Obstructive sleep apnea (adult) (pediatric): Secondary | ICD-10-CM

## 2017-02-25 NOTE — Patient Instructions (Signed)
Medication Instructions:  Your physician recommends that you continue on your current medications as directed. Please refer to the Current Medication list given to you today.  Labwork: None ordered  Testing/Procedures: None ordered   Follow-Up: You will receive a call from [place], CPAP assistant to schedule a follow up appointment.   Any Other Special Instructions Will Be Listed Below (If Applicable).     If you need a refill on your cardiac medications before your next appointment, please call your pharmacy.

## 2017-03-04 NOTE — Progress Notes (Signed)
Cardiology Office Note:    Date:  03/04/2017   ID:  Daniel Tucker, DOB 24-Sep-1940, MRN [id number]  PCP:  Binnie Rail, MD  Cardiologist:  Fransico Him, MD   Referring MD: Binnie Rail, MD   Chief Complaint  Patient presents with  . Sleep Apnea    History of Present Illness:    Daniel Tucker is a 76 y.o. male with a hx of HTN, paroxysmal atrial fibrillation followed in atrial fibrillation clinic, mild obstructive sleep apnea with AHI 12.3/h and oxygen desaturations to 86% with soft snoring.  He is status post remote tonsillectomy and uvuloplasty.  Given the mild degree of obstructive sleep apnea, he decided to pursue an oral device.  He has been followed by Dr. Toy Cookey.  When I saw him in February he was doing well.  A home sleep study was done by Dr. Toy Cookey which showed mild to moderate obstructive sleep apnea with an AHI of 15.5/h despite the oral device being used.  He was referred to Dr. Redmond Baseman at [facility] for consideration of the inspire stimulator device but then decided he was not interested in it.    I recommended proceeding with an overnight home sleep study with his oral device in place.  This showed moderate obstructive sleep apnea with an AHI of 20/hr. He then underwent an lab sleep study which showed mild obstructive sleep apnea with overall an AHI of 7.9/h during REM sleep 11.2/h with oxygen saturations as low as 85%.  He underwent CPAP titration to 15 cm of water pressure but did not want to start the CPAP until he discussed his results with me further.  He is now here to discuss this further.  He is very frustrated that he has not heard anything from Dr. Toy Cookey in regards to the several home sleep studies that he has had.  He says a sleep device bothers his mouth and his bite.  He also thinks he still snoring.   Past Medical History:  Diagnosis Date  . Arthritis    "hips" (11/20/2014)  . Benign essential HTN   . Fasting hyperglycemia 2006   FBS 111; normal  A1c  . Hepatitis ~ 1955   "yellow juandice"  . Hyperlipidemia   . Persistent atrial fibrillation ([place]) 08/24/2014   CHASD2VASC score is 3 - on Apixaban  . Sleep apnea    intolerant to CPAP and now using oral device followed by Dr. Toy Cookey  . Tubular adenoma of colon 2006   Dr Fuller Plan    Past Surgical History:  Procedure Laterality Date  . COLONOSCOPY  2011   no polyp  . COLONOSCOPY W/ POLYPECTOMY  2006   [place]  . NASAL SINUS SURGERY  1997  . PROSTATE BIOPSY  2010   Negative,Dr.Ottelin  . TONSILLECTOMY AND ADENOIDECTOMY  1997  . 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 UVULOPALATOPHARYNGOPLASTY  1997   Dr.Woliki    Current Medications: Current Meds  Medication Sig  . acetaminophen (TYLENOL) 500 MG chewable tablet Chew 500 mg by mouth every 6 (six) hours as needed for pain.  Marland Kitchen acyclovir (ZOVIRAX) 200 MG capsule TAKE 2 CAPSULES EVERY DAY AS DIRECTED  . amLODipine (NORVASC) 5 MG tablet Take 1.5 tablets (7.5 mg total) by mouth daily.  . Ascorbic Acid (VITAMIN C) 1000 MG tablet Take 1,000 mg by mouth daily.    Marland Kitchen docusate sodium (COLACE) 100 MG capsule Take 100 mg daily by mouth.  . dofetilide (TIKOSYN) 500  MCG capsule TAKE 1 CAPSULE TWICE DAILY  . ELIQUIS 5 MG TABS tablet TAKE 1 TABLET (5 MG TOTAL) BY MOUTH 2 (TWO) TIMES DAILY.  Marland Kitchen GLUCOSAMINE SULFATE PO Take 2 tablets by mouth daily. Will start back med in January 2017  . hydrocortisone (ANUSOL-HC) 25 MG suppository Place 1 suppository (25 mg total) rectally 2 (two) times daily.  Marland Kitchen KLOR-CON M20 20 MEQ tablet TAKE 1 TABLET FOUR TIMES DAILY  . metoprolol tartrate (LOPRESSOR) 25 MG tablet TAKE 1/2 TABLET (12.5 MG TOTAL) BY MOUTH 2 (TWO) TIMES DAILY.  . Multiple Vitamin (MULTIVITAMIN) tablet Take 1 tablet by mouth daily.   . Multiple Vitamins-Minerals (PRESERVISION AREDS 2 PO) Take 1 tablet by mouth 2 (two) times daily.  . pravastatin (PRAVACHOL) 20 MG tablet Take 1 tablet (20 mg total) by mouth daily.     Allergies:    Patient has no known allergies.   Social History   Socioeconomic History  . Marital status: Married    Spouse name: None  . Number of children: None  . Years of education: None  . Highest education level: None  Social Needs  . Financial resource strain: None  . Food insecurity - worry: None  . Food insecurity - inability: None  . Transportation needs - medical: None  . Transportation needs - non-medical: None  Occupational History  . None  Tobacco Use  . Smoking status: Former Smoker    Packs/day: 1.00    Years: 7.00    Pack years: 7.00    Types: Cigarettes  . Smokeless tobacco: Never Used  . Tobacco comment: smoked cigarettes age [age], up to 1ppd  Substance and Sexual Activity  . Alcohol use: Yes    Alcohol/week: 1.2 oz    Types: 1 Glasses of wine, 1 Cans of beer per week    Comment: less than 1 drink per week  . Drug use: No  . Sexual activity: Yes  Other Topics Concern  . None  Social History Narrative  . None     Family History: The patient's family history includes Atrial fibrillation in his brother; Hypertension in his father and mother; Lupus in his sister; Melanoma in his brother and sister; Prostate cancer in his brother and father. There is no history of Diabetes, Heart disease, Stroke, or Colon cancer.  ROS:   Please see the history of present illness.    ROS  All other systems reviewed and negative.   EKGs/Labs/Other Studies Reviewed:    The following studies were reviewed today: Sleep study and CPAP titration  EKG:  EKG is not ordered today.  Recent Labs: 08/25/2016: ALT 14; Hemoglobin 15.4; Platelets 213.0; TSH 1.18 12/08/2016: BUN 15; Creatinine, Ser 1.05; Magnesium 2.2; Potassium 4.2; Sodium 138   Recent Lipid Panel    Component Value Date/Time   CHOL 149 08/25/2016 1043   CHOL 162 08/24/2014 1042   TRIG 121.0 08/25/2016 1043   TRIG 77 08/24/2014 1042   HDL 48.50 08/25/2016 1043   HDL 50 08/24/2014 1042   CHOLHDL 3 08/25/2016 1043    VLDL 24.2 08/25/2016 1043   LDLCALC 76 08/25/2016 1043   LDLCALC 97 08/24/2014 1042    Physical Exam:    VS:  BP (!) 150/7   Pulse 67   Ht 5\' 9"  (1.753 m)   Wt 188 lb 9.6 oz (85.5 kg)   SpO2 98%   BMI 27.85 kg/m      Wt Readings from Last 3 Encounters:  02/25/17 188 lb 9.6 oz (  85.5 kg)  02/05/17 190 lb (86.2 kg)  12/08/16 187 lb (84.8 kg)     GEN:  Well nourished, well developed in no acute distress HEENT: Normal NECK: No JVD; No carotid bruits LYMPHATICS: No lymphadenopathy CARDIAC: RRR, no murmurs, rubs, gallops RESPIRATORY:  Clear to auscultation without rales, wheezing or rhonchi  ABDOMEN: Soft, non-tender, non-distended MUSCULOSKELETAL:  No edema; No deformity  SKIN: Warm and dry NEUROLOGIC:  Alert and oriented x 3 PSYCHIATRIC:  Normal affect   ASSESSMENT:    1. Obstructive sleep apnea   2. Essential hypertension    PLAN:    In order of problems listed above:  1.  Obstructive sleep apnea- he has persistent obstructive sleep apnea despite wearing an oral device.  He is very frustrated with oral device and says that is messing up his bite is uncomfortable.  I have therefore recommended that he start the CPAP therapy.  He did ask if there were any other options and we did discuss the inspire device but he is already talked to Dr. Redmond Baseman about this and does not want to pursue this route.  He will go ahead and get started on the CPAP therapy and I will see him back in about 10 weeks to see how he is doing.  2.  Hypertension-blood pressure is borderline controlled on exam today.  Hopefully this will improve with CPAP therapy.  He will continue on amlodipine 7.5 mg daily, Lopressor 12.5 mg twice daily.   Medication Adjustments/Labs and Tests Ordered: Current medicines are reviewed at length with the patient today.  Concerns regarding medicines are outlined above.  No orders of the defined types were placed in this encounter.  No orders of the defined types were placed  in this encounter.   Signed, Fransico Him, MD  03/04/2017 8:58 AM    [place]

## 2017-03-04 NOTE — Progress Notes (Deleted)
Cardiology Office Note:    Date:  03/01/2017   ID:  Daniel Tucker, DOB [birth year], MRN [id number]  PCP:  Binnie Rail, MD  Cardiologist:  Fransico Him, MD   Referring MD: Binnie Rail, MD   Chief Complaint  Patient presents with  . Sleep Apnea    History of Present Illness:    Daniel Tucker is a 76 y.o. male with a hx of HTN,     I recommended proceeding with an overnight sleep study with his oral device in place.  This showed mild obstructive sleep apnea with an AHI of 7.9/h with oxygen desaturations of 85%.  He was noted to have some nonsustained tachycardia at that time.   Marland Kitchen  He did not want to pursue this until he spoke with me.  He is now here to discuss this further.  He is very frustrated that he has not heard anything from Dr. Toy Cookey in regards to the several home sleep studies that he has had.  He says a sleep device bothers his mouth and his bite.  He also thinks he still snoring.  Past Medical History:  Diagnosis Date  . Arthritis    "hips" (11/20/2014)  . Benign essential HTN   . Fasting hyperglycemia 2006   FBS 111; normal A1c  . Hepatitis ~ 1955   "yellow juandice"  . Hyperlipidemia   . Persistent atrial fibrillation ([place]) 08/24/2014   CHASD2VASC score is 3 - on Apixaban  . Sleep apnea    intolerant to CPAP and now using oral device followed by Dr. Toy Cookey  . Tubular adenoma of colon 2006   Dr Fuller Plan    Past Surgical History:  Procedure Laterality Date  . COLONOSCOPY  2011   no polyp  . COLONOSCOPY W/ POLYPECTOMY  2006   [place]  . NASAL SINUS SURGERY  1997  . PROSTATE BIOPSY  2010   Negative,Dr.Ottelin  . TONSILLECTOMY AND ADENOIDECTOMY  1997  . 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 UVULOPALATOPHARYNGOPLASTY  1997   Dr.Woliki    Current Medications: Current Meds  Medication Sig  . acetaminophen (TYLENOL) 500 MG chewable tablet Chew 500 mg by mouth every 6 (six) hours as needed for pain.  Marland Kitchen acyclovir (ZOVIRAX) 200 MG  capsule TAKE 2 CAPSULES EVERY DAY AS DIRECTED  . amLODipine (NORVASC) 5 MG tablet Take 1.5 tablets (7.5 mg total) by mouth daily.  . Ascorbic Acid (VITAMIN C) 1000 MG tablet Take 1,000 mg by mouth daily.    Marland Kitchen docusate sodium (COLACE) 100 MG capsule Take 100 mg daily by mouth.  . dofetilide (TIKOSYN) 500 MCG capsule TAKE 1 CAPSULE TWICE DAILY  . ELIQUIS 5 MG TABS tablet TAKE 1 TABLET (5 MG TOTAL) BY MOUTH 2 (TWO) TIMES DAILY.  Marland Kitchen GLUCOSAMINE SULFATE PO Take 2 tablets by mouth daily. Will start back med in January 2017  . hydrocortisone (ANUSOL-HC) 25 MG suppository Place 1 suppository (25 mg total) rectally 2 (two) times daily.  Marland Kitchen KLOR-CON M20 20 MEQ tablet TAKE 1 TABLET FOUR TIMES DAILY  . metoprolol tartrate (LOPRESSOR) 25 MG tablet TAKE 1/2 TABLET (12.5 MG TOTAL) BY MOUTH 2 (TWO) TIMES DAILY.  . Multiple Vitamin (MULTIVITAMIN) tablet Take 1 tablet by mouth daily.   . Multiple Vitamins-Minerals (PRESERVISION AREDS 2 PO) Take 1 tablet by mouth 2 (two) times daily.  . pravastatin (PRAVACHOL) 20 MG tablet Take 1 tablet (20 mg total) by mouth daily.     Allergies:  Patient has no known allergies.   Social History   Socioeconomic History  . Marital status: Married    Spouse name: None  . Number of children: None  . Years of education: None  . Highest education level: None  Social Needs  . Financial resource strain: None  . Food insecurity - worry: None  . Food insecurity - inability: None  . Transportation needs - medical: None  . Transportation needs - non-medical: None  Occupational History  . None  Tobacco Use  . Smoking status: Former Smoker    Packs/day: 1.00    Years: 7.00    Pack years: 7.00    Types: Cigarettes  . Smokeless tobacco: Never Used  . Tobacco comment: smoked cigarettes age [age], up to 1ppd  Substance and Sexual Activity  . Alcohol use: Yes    Alcohol/week: 1.2 oz    Types: 1 Glasses of wine, 1 Cans of beer per week    Comment: less than 1 drink per week    . Drug use: No  . Sexual activity: Yes  Other Topics Concern  . None  Social History Narrative  . None     Family History: The patient's family history includes Atrial fibrillation in his brother; Hypertension in his father and mother; Lupus in his sister; Melanoma in his brother and sister; Prostate cancer in his brother and father. There is no history of Diabetes, Heart disease, Stroke, or Colon cancer.  ROS:   Please see the history of present illness.    ROS  All other systems reviewed and negative.   EKGs/Labs/Other Studies Reviewed:    The following studies were reviewed today: Sleep study  EKG:  EKG is not ordered today.   Recent Labs: 08/25/2016: ALT 14; Hemoglobin 15.4; Platelets 213.0; TSH 1.18 12/08/2016: BUN 15; Creatinine, Ser 1.05; Magnesium 2.2; Potassium 4.2; Sodium 138   Recent Lipid Panel    Component Value Date/Time   CHOL 149 08/25/2016 1043   CHOL 162 08/24/2014 1042   TRIG 121.0 08/25/2016 1043   TRIG 77 08/24/2014 1042   HDL 48.50 08/25/2016 1043   HDL 50 08/24/2014 1042   CHOLHDL 3 08/25/2016 1043   VLDL 24.2 08/25/2016 1043   LDLCALC 76 08/25/2016 1043   LDLCALC 97 08/24/2014 1042    Physical Exam:    VS:  BP (!) 150/7   Pulse 67   Ht 5\' 9"  (1.753 m)   Wt 188 lb 9.6 oz (85.5 kg)   SpO2 98%   BMI 27.85 kg/m     Wt Readings from Last 3 Encounters:  02/25/17 188 lb 9.6 oz (85.5 kg)  02/05/17 190 lb (86.2 kg)  12/08/16 187 lb (84.8 kg)     GEN:  Well nourished, well developed in no acute distress HEENT: Normal NECK: No JVD; No carotid bruits LYMPHATICS: No lymphadenopathy CARDIAC: RRR, no murmurs, rubs, gallops RESPIRATORY:  Clear to auscultation without rales, wheezing or rhonchi  ABDOMEN: Soft, non-tender, non-distended MUSCULOSKELETAL:  No edema; No deformity  SKIN: Warm and dry NEUROLOGIC:  Alert and oriented x 3 PSYCHIATRIC:  Normal affect   ASSESSMENT:    No diagnosis found. PLAN:    In order of problems listed  above:  ***   Medication Adjustments/Labs and Tests Ordered: Current medicines are reviewed at length with the patient today.  Concerns regarding medicines are outlined above.  No orders of the defined types were placed in this encounter.  No orders of the defined types were placed in this  encounter.   Signed, Fransico Him, MD  03/01/2017 9:51 PM    [place]

## 2017-03-05 ENCOUNTER — Telehealth (HOSPITAL_COMMUNITY): Payer: Self-pay | Admitting: *Deleted

## 2017-03-05 NOTE — Telephone Encounter (Signed)
Patient called in stating he went back into afib early this morning. HR is in the 80-100 range irregular. BP 133/79. Pt feels ok other than can tell his heart rate is up. Pt tried extra 1/2 tab of metoprolol around 10 this morning which made him feel better but this afternoon he is still in afib. Offered pt appointment to be seen or he can try increasing metoprolol to 25mg  BID over weekend and see if this will convert him. If he does convert he will return to his normal dosing of 12.5mg  BID due to history of bradycardia in NSR. Pt will call with report on Monday if still in afib will bring in to set up cardioversion. Pt and wife verbalize understanding.

## 2017-03-06 NOTE — Telephone Encounter (Signed)
Pt called back in this morning stating he had returned to NSR HR in the upper 50s. Will decrease back to normal dosing of metoprolol 1/2 tab twice a day. Will call if further issues.

## 2017-05-18 ENCOUNTER — Other Ambulatory Visit (HOSPITAL_COMMUNITY): Payer: Self-pay | Admitting: Nurse Practitioner

## 2017-05-27 DIAGNOSIS — M1611 Unilateral primary osteoarthritis, right hip: Secondary | ICD-10-CM | POA: Diagnosis not present

## 2017-06-08 ENCOUNTER — Ambulatory Visit (HOSPITAL_COMMUNITY)
Admission: RE | Admit: 2017-06-08 | Discharge: 2017-06-08 | Disposition: A | Discharge status: Home / Self Care | Payer: Medicare Other | Source: Ambulatory Visit | Attending: Nurse Practitioner | Admitting: Nurse Practitioner

## 2017-06-08 ENCOUNTER — Encounter (HOSPITAL_COMMUNITY): Payer: Self-pay | Admitting: Nurse Practitioner

## 2017-06-08 VITALS — BP 152/82 | HR 56 | Ht 69.0 in | Wt 188.8 lb

## 2017-06-08 DIAGNOSIS — E785 Hyperlipidemia, unspecified: Secondary | ICD-10-CM | POA: Diagnosis not present

## 2017-06-08 DIAGNOSIS — I480 Paroxysmal atrial fibrillation: Secondary | ICD-10-CM | POA: Diagnosis not present

## 2017-06-08 DIAGNOSIS — Z7901 Long term (current) use of anticoagulants: Secondary | ICD-10-CM | POA: Insufficient documentation

## 2017-06-08 DIAGNOSIS — G4733 Obstructive sleep apnea (adult) (pediatric): Secondary | ICD-10-CM | POA: Diagnosis not present

## 2017-06-08 DIAGNOSIS — Z87891 Personal history of nicotine dependence: Secondary | ICD-10-CM | POA: Diagnosis not present

## 2017-06-08 DIAGNOSIS — I10 Essential (primary) hypertension: Secondary | ICD-10-CM | POA: Diagnosis not present

## 2017-06-08 DIAGNOSIS — Z79899 Other long term (current) drug therapy: Secondary | ICD-10-CM | POA: Insufficient documentation

## 2017-06-08 LAB — BASIC METABOLIC PANEL
Anion gap: 10 (ref 5–15)
BUN: 13 mg/dL (ref 6–20)
CHLORIDE: 102 mmol/L (ref 101–111)
CO2: 26 mmol/L (ref 22–32)
CREATININE: 1.08 mg/dL (ref 0.61–1.24)
Calcium: 9.3 mg/dL (ref 8.9–10.3)
GFR calc Af Amer: >60 mL/min (ref >60)
GFR calc non Af Amer: >60 mL/min (ref >60)
GLUCOSE: 88 mg/dL (ref 65–99)
POTASSIUM: 4.1 mmol/L (ref 3.5–5.1)
Sodium: 138 mmol/L (ref 135–145)

## 2017-06-08 LAB — MAGNESIUM: Magnesium: 2.2 mg/dL (ref 1.7–2.4)

## 2017-06-08 NOTE — Progress Notes (Addendum)
Patient ID: Daniel Tucker, male   DOB: 1941-02-07, 77 y.o.   MRN: [id number]     Primary Care Physician: Binnie Rail, MD Referring Physician: Dr. Jovita Kussmaul is a 77 y.o. male with a h/o paroxysmal afib maintaining SR on tikosyn. He reports that he feels well.No issues with blood thinner.  2 short episodes of afib that responded well to small dose of increase in BB. Using mouth piece for sleep apnea followed thru dentist, Dr. Toy Cookey. However, Dr. Radford Pax had recently encouraged pt to use cpap and pt has to look into getting supplies. Qtc stable.  Today, he denies symptoms of palpitations, chest pain, shortness of breath, orthopnea, PND, lower extremity edema, dizziness, presyncope, syncope, or neurologic sequela. The patient is tolerating medications without difficulties and is otherwise without complaint today.   Past Medical History:  Diagnosis Date  . Arthritis    "hips" (11/20/2014)  . Benign essential HTN   . Fasting hyperglycemia 2006   FBS 111; normal A1c  . Hepatitis ~ 1955   "yellow juandice"  . Hyperlipidemia   . Persistent atrial fibrillation ([place]) 08/24/2014   CHASD2VASC score is 3 - on Apixaban  . Sleep apnea    intolerant to CPAP and now using oral device followed by Dr. Toy Cookey  . Tubular adenoma of colon 2006   Dr Fuller Plan   Past Surgical History:  Procedure Laterality Date  . CARDIOVERSION N/A 10/12/2014   Procedure: CARDIOVERSION;  Surgeon: Darlin Coco, MD;  Location[facility] ENDOSCOPY;  Service: Cardiovascular;  Laterality: N/A;  . COLONOSCOPY  2011   no polyp  . COLONOSCOPY W/ POLYPECTOMY  2006   [place]  . NASAL SINUS SURGERY  1997  . PROSTATE BIOPSY  2010   Negative,Dr.Ottelin  . TONSILLECTOMY AND ADENOIDECTOMY  1997  . 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 UVULOPALATOPHARYNGOPLASTY  1997   Dr.Woliki    Current Outpatient Medications  Medication Sig Dispense Refill  . acetaminophen (TYLENOL) 500 MG chewable tablet Chew  500 mg by mouth every 6 (six) hours as needed for pain.    Marland Kitchen acyclovir (ZOVIRAX) 200 MG capsule TAKE 2 CAPSULES EVERY DAY AS DIRECTED 180 capsule 3  . amLODipine (NORVASC) 5 MG tablet Take 1.5 tablets (7.5 mg total) by mouth daily. 135 tablet 3  . Ascorbic Acid (VITAMIN C) 1000 MG tablet Take 1,000 mg by mouth daily.      Marland Kitchen docusate sodium (COLACE) 100 MG capsule Take 100 mg daily by mouth.    . dofetilide (TIKOSYN) 500 MCG capsule TAKE 1 CAPSULE TWICE DAILY 180 capsule 1  . ELIQUIS 5 MG TABS tablet TAKE 1 TABLET (5 MG TOTAL) BY MOUTH 2 (TWO) TIMES DAILY. 180 tablet 3  . GLUCOSAMINE SULFATE PO Take 2 tablets by mouth daily. Will start back med in January 2017    . hydrocortisone (ANUSOL-HC) 25 MG suppository Place 1 suppository (25 mg total) rectally 2 (two) times daily. 12 suppository 1  . KLOR-CON M20 20 MEQ tablet TAKE 1 TABLET FOUR TIMES DAILY 360 tablet 3  . metoprolol tartrate (LOPRESSOR) 25 MG tablet TAKE 1/2 TABLET (12.5 MG TOTAL) BY MOUTH 2 (TWO) TIMES DAILY. 90 tablet 3  . Multiple Vitamin (MULTIVITAMIN) tablet Take 1 tablet by mouth daily.     . Multiple Vitamins-Minerals (PRESERVISION AREDS 2 PO) Take 1 tablet by mouth 2 (two) times daily.    . pravastatin (PRAVACHOL) 20 MG tablet Take 1 tablet (20 mg total) by  mouth daily. 90 tablet 3   No current facility-administered medications for this encounter.     No Known Allergies  Social History   Socioeconomic History  . Marital status: Married    Spouse name: Not on file  . Number of children: Not on file  . Years of education: Not on file  . Highest education level: Not on file  Social Needs  . Financial resource strain: Not on file  . Food insecurity - worry: Not on file  . Food insecurity - inability: Not on file  . Transportation needs - medical: Not on file  . Transportation needs - non-medical: Not on file  Occupational History  . Not on file  Tobacco Use  . Smoking status: Former Smoker    Packs/day: 1.00     Years: 7.00    Pack years: 7.00    Types: Cigarettes  . Smokeless tobacco: Never Used  . Tobacco comment: smoked cigarettes age [age], up to 1ppd  Substance and Sexual Activity  . Alcohol use: Yes    Alcohol/week: 1.2 oz    Types: 1 Glasses of wine, 1 Cans of beer per week    Comment: less than 1 drink per week  . Drug use: No  . Sexual activity: Yes  Other Topics Concern  . Not on file  Social History Narrative  . Not on file    Family History  Problem Relation Age of Onset  . Prostate cancer Father   . Hypertension Father   . Hypertension Mother   . Prostate cancer Brother   . Melanoma Sister   . Atrial fibrillation Brother   . Melanoma Brother   . Lupus Sister   . Diabetes Neg Hx   . Heart disease Neg Hx   . Stroke Neg Hx   . Colon cancer Neg Hx     ROS- All systems are reviewed and negative except as per the HPI above  Physical Exam: Vitals:   06/08/17 1353  BP: (!) 152/82  Pulse: (!) 56  Weight: 188 lb 12.8 oz (85.6 kg)  Height: 5\' 9"  (1.753 m)    GEN- The patient is well appearing, alert and oriented x 3 today.   Head- normocephalic, atraumatic Eyes-  Sclera clear, conjunctiva pink Ears- hearing intact Oropharynx- clear Neck- supple, no JVP Lymph- no cervical lymphadenopathy Lungs- Clear to ausculation bilaterally, normal work of breathing Heart- Slow,regular rate and rhythm, no murmurs, rubs or gallops, PMI not laterally displaced GI- soft, NT, ND, + BS Extremities- no clubbing, cyanosis, or edema MS- no significant deformity or atrophy Skin- no rash or lesion Psych- euthymic mood, full affect Neuro- strength and sensation are intact  EKG- Sinus brady  at 56 bpm,  LAD, RBBB,  pr int 128 ms, qrs int 128 ms, qtc 441 ms(stable).  Epic records reviewed.  Assessment and Plan: 1. Afib Continue on tikosyn 500 mg bid, qtc stable Continue apixaban, no bleeding issues Bmet/mag today  2.OSA F/u with Dr. Radford Pax for sleep apnea Still wearing  mouthpiece, but is suppose to be looking tinto cpap  3. HTN  Stable Avoid salt  4. Asymptomatic brady Improved  with reduction of metoprolol   F/u in 5-6 months in afib clinic Has been very stable on [employer] C. Ayad Nieman, [facility] [address] [place], [place] [address] [phone]

## 2017-06-10 ENCOUNTER — Ambulatory Visit (HOSPITAL_COMMUNITY): Payer: Medicare Other | Admitting: Nurse Practitioner

## 2017-06-20 ENCOUNTER — Other Ambulatory Visit: Payer: Self-pay | Admitting: Internal Medicine

## 2017-06-24 ENCOUNTER — Telehealth: Payer: Self-pay | Admitting: Cardiology

## 2017-06-24 NOTE — Telephone Encounter (Signed)
New Message  Pt verbalized that he discussed getting a CPAP with [facility] and he would like to know what steps he needs to take to do so. Please call

## 2017-06-26 NOTE — Telephone Encounter (Signed)
Reached out to patient and informed him that Dr Radford Pax ordered him a CPAP on his11/7/18 office visit so I have sent that order to his preferred DME  [facility] to be processed. Patient has been notified.

## 2017-07-07 ENCOUNTER — Other Ambulatory Visit (HOSPITAL_COMMUNITY): Payer: Self-pay | Admitting: Nurse Practitioner

## 2017-08-14 ENCOUNTER — Ambulatory Visit: Payer: Medicare Other | Admitting: Gastroenterology

## 2017-08-21 ENCOUNTER — Encounter: Payer: Self-pay | Admitting: Gastroenterology

## 2017-08-21 ENCOUNTER — Ambulatory Visit (INDEPENDENT_AMBULATORY_CARE_PROVIDER_SITE_OTHER): Payer: Medicare Other | Admitting: Gastroenterology

## 2017-08-21 VITALS — BP 140/80 | HR 56 | Ht 67.5 in | Wt 188.0 lb

## 2017-08-21 DIAGNOSIS — K625 Hemorrhage of anus and rectum: Secondary | ICD-10-CM | POA: Diagnosis not present

## 2017-08-21 DIAGNOSIS — K648 Other hemorrhoids: Secondary | ICD-10-CM

## 2017-08-21 DIAGNOSIS — Z86010 Personal history of colonic polyps: Secondary | ICD-10-CM

## 2017-08-21 MED ORDER — HYDROCORTISONE ACETATE 25 MG RE SUPP: 25.0000 mg | Freq: Every day | RECTAL | 1 refills | Status: DC | PRN

## 2017-08-21 NOTE — Progress Notes (Signed)
    History of Present Illness: This is a 77 year old male with intermittent rectal bleeding on Eliquis.  He relates that his rectal bleeding improved after taking a course of Anusol HC suppositories last October however every few weeks he notes a small amount of bright red blood per rectum typically with bowel movements but sometimes with the passage of gas or urination.  His stools are soft without straining on daily Colace which she is continued to use since October.  Colonoscopy 07/2015: - One 6 mm polyp in the rectum, removed with a cold snare. Resected and retrieved. (tubluar adenoma) - Internal hemorrhoids. - Mild sigmoid diverticulosis  Current Medications, Allergies, Past Medical History, Past Surgical History, Family History and Social History were reviewed in [employer] record.  Physical Exam: General: Well developed, well nourished, no acute distress Head: Normocephalic and atraumatic Eyes:  sclerae anicteric, EOMI Ears: Normal auditory acuity Mouth: No deformity or lesions Lungs: Clear throughout to auscultation Heart: Regular rate and rhythm; no murmurs, rubs or bruits Abdomen: Soft, non tender and non distended. No masses, hepatosplenomegaly or hernias noted. Normal Bowel sounds Rectal:  No lesions, no tenderness, heme negative brown stool Musculoskeletal: Symmetrical with no gross deformities  Pulses:  Normal pulses noted Extremities: No clubbing, cyanosis, edema or deformities noted Neurological: Alert oriented x 4, grossly nonfocal Psychological:  Alert and cooperative. Normal mood and affect  Assessment and Recommendations:  1. Rectal bleeding due to internal hemorrhoids.  Anusol HC suppositories for 4 days when hemorrhoid symptoms occur.  Continue daily Colace.  Maintain adequate hydration.  If symptoms persist/not well controlled with above measures we discussed hemorrhoidal banding performed while on Eliquis. He is interested in banding if his  symptoms are not adequately controlled.  He will contact us if his symptoms are not adequately controlled.  2. Personal history of adenomatous colon polyps.  A 5-year interval surveillance colonoscopy is recommended in April 2022.

## 2017-08-21 NOTE — Patient Instructions (Signed)
We have sent the following medications to your pharmacy for you to pick up at your convenience: Anusol.  Call our office back if you decide you want to schedule a hemorrhoid banding.   Normal BMI (Body Mass Index- based on height and weight) is between 23 and 30. Your BMI today is Body mass index is 29.01 kg/m. Marland Kitchen Please consider follow up  regarding your BMI with your Primary Care Provider.  Thank you for choosing me and [facility].  Pricilla Riffle. Dagoberto Ligas., MD., Marval Regal

## 2017-08-24 ENCOUNTER — Other Ambulatory Visit: Payer: Self-pay | Admitting: Internal Medicine

## 2017-09-07 ENCOUNTER — Telehealth: Payer: Self-pay | Admitting: Gastroenterology

## 2017-09-07 NOTE — Telephone Encounter (Signed)
Patient states Dr.Stark told him to call nad schedule hem banding if he continued to have problems. Pt wanting to know if he can go ahead and schedule those. Please advise.

## 2017-09-07 NOTE — Telephone Encounter (Signed)
Banding #1 scheduled with the patient's wife.

## 2017-09-11 ENCOUNTER — Ambulatory Visit (INDEPENDENT_AMBULATORY_CARE_PROVIDER_SITE_OTHER): Payer: Medicare Other | Admitting: *Deleted

## 2017-09-11 VITALS — BP 142/64 | HR 64 | Resp 18 | Ht 68.0 in | Wt 188.0 lb

## 2017-09-11 DIAGNOSIS — Z0000 Encounter for general adult medical examination without abnormal findings: Secondary | ICD-10-CM

## 2017-09-11 NOTE — Patient Instructions (Signed)
Continue doing brain stimulating activities (puzzles, reading, adult coloring books, staying active) to keep memory sharp.   Continue to eat heart healthy diet (full of fruits, vegetables, whole grains, lean protein, water--limit salt, fat, and sugar intake) and increase physical activity as tolerated.   [name] , Thank you for taking time to come for your Medicare Wellness Visit. I appreciate your ongoing commitment to your health goals. Please review the following plan we discussed and let me know if I can assist you in the future.   These are the goals we discussed: Goals    . Patient Stated     I want to continue to work on my inside and outside projects which keeps me active. 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        This is a list of the screening recommended for you and due dates:  Health Maintenance  Topic Date Due  . Flu Shot  11/19/2017  . Tetanus Vaccine  11/09/2019  . Colon Cancer Screening  08/01/2020  . Pneumonia vaccines  Completed

## 2017-09-11 NOTE — Progress Notes (Addendum)
Subjective:   Daniel Tucker is a 77 y.o. male who presents for Medicare Annual/Subsequent preventive examination.  Review of Systems:  No ROS.  Medicare Wellness Visit. Additional risk factors are reflected in the social history.  Cardiac Risk Factors include: advanced age (>45men, >71 women);hypertension;male gender;dyslipidemia Sleep patterns: feels rested on waking, gets up 2 times nightly to void and sleeps 7-8 hours nightly.    Home Safety/Smoke Alarms: Feels safe in home. Smoke alarms in place.  Living environment; residence and Firearm Safety: 1-story house/ trailer, no firearms. Lives with wife, no needs for DME, good support system Seat Belt Safety/Bike Helmet: Wears seat belt.     Objective:    Vitals: BP (!) 142/64   Pulse 64   Resp 18   Ht 5\' 8"  (1.727 m)   Wt 188 lb (85.3 kg)   SpO2 98%   BMI 28.59 kg/m   Body mass index is 28.59 kg/m.  Advanced Directives 09/11/2017 02/05/2017 10/02/2016 09/05/2016 12/02/2014 12/01/2014 11/20/2014  Does Patient Have a Medical Advance Directive? No No No No No No No  Would patient like information on creating a medical advance directive? No - Patient declined No - Patient declined No - Patient declined No - Patient declined - - Yes - Scientist, clinical (histocompatibility and immunogenetics) given    Tobacco Social History   Tobacco Use  Smoking Status Former Smoker  . Packs/day: 1.00  . Years: 7.00  . Pack years: 7.00  . Types: Cigarettes  Smokeless Tobacco Never Used  Tobacco Comment   smoked cigarettes age [age], up to 1ppd     Counseling given: Not Answered Comment: smoked cigarettes age [age], up to 1ppd   Clinical Intake:     Pain : 0-10 Pain Score: 1  Pain Type: Chronic pain Pain Location: Hip Pain Orientation: Right  Past Medical History:  Diagnosis Date  . Arthritis    "hips" (11/20/2014)  . Benign essential HTN   . Fasting hyperglycemia 2006   FBS 111; normal A1c  . Hepatitis ~ 1955   "yellow juandice"  . Hyperlipidemia   .  Persistent atrial fibrillation ([place]) 08/24/2014   CHASD2VASC score is 3 - on Apixaban  . Sleep apnea    intolerant to CPAP and now using oral device followed by Dr. Toy Cookey  . Tubular adenoma of colon 2006   Dr Fuller Plan   Past Surgical History:  Procedure Laterality Date  . CARDIOVERSION N/A 10/12/2014   Procedure: CARDIOVERSION;  Surgeon: Darlin Coco, MD;  Location: [facility] ENDOSCOPY;  Service: Cardiovascular;  Laterality: N/A;  . COLONOSCOPY  2011   no polyp  . COLONOSCOPY W/ POLYPECTOMY  2006   [place]  . NASAL SINUS SURGERY  1997  . PROSTATE BIOPSY  2010   Negative,Dr.Ottelin  . TONSILLECTOMY AND ADENOIDECTOMY  1997  . 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 UVULOPALATOPHARYNGOPLASTY  1997   Dr.Woliki   Family History  Problem Relation Age of Onset  . Prostate cancer Father   . Hypertension Father   . Hypertension Mother   . Prostate cancer Brother   . Melanoma Sister   . Atrial fibrillation Brother   . Melanoma Brother   . Lupus Sister   . Diabetes Neg Hx   . Heart disease Neg Hx   . Stroke Neg Hx   . Colon cancer Neg Hx    Social History   Socioeconomic History  . Marital status: Married    Spouse name: Not on file  . Number  of children: 3  . Years of education: Not on file  . Highest education level: Not on file  Occupational History  . Not on file  Social Needs  . Financial resource strain: Not hard at all  . Food insecurity:    Worry: Never true    Inability: Never true  . Transportation needs:    Medical: No    Non-medical: No  Tobacco Use  . Smoking status: Former Smoker    Packs/day: 1.00    Years: 7.00    Pack years: 7.00    Types: Cigarettes  . Smokeless tobacco: Never Used  . Tobacco comment: smoked cigarettes age [age], up to 1ppd  Substance and Sexual Activity  . Alcohol use: Yes    Alcohol/week: 1.2 oz    Types: 1 Glasses of wine, 1 Cans of beer per week    Comment: less than 1 drink per week  . Drug use: No  . Sexual  activity: Yes  Lifestyle  . Physical activity:    Days per week: 4 days    Minutes per session: 50 min  . Stress: Not at all  Relationships  . Social connections:    Talks on phone: More than three times a week    Gets together: More than three times a week    Attends religious service: More than 4 times per year    Active member of club or organization: Yes    Attends meetings of clubs or organizations: More than 4 times per year    Relationship status: Married  Other Topics Concern  . Not on file  Social History Narrative  . Not on file    Outpatient Encounter Medications as of 09/11/2017  Medication Sig  . acetaminophen (TYLENOL) 500 MG chewable tablet Chew 500 mg by mouth every 6 (six) hours as needed for pain.  Marland Kitchen acyclovir (ZOVIRAX) 200 MG capsule TAKE 2 CAPSULES EVERY DAY AS DIRECTED  . amLODipine (NORVASC) 5 MG tablet Take 1.5 tablets (7.5 mg total) by mouth daily. -- Office visit needed for further refills  . Ascorbic Acid (VITAMIN C) 1000 MG tablet Take 1,000 mg by mouth daily.    Marland Kitchen docusate sodium (COLACE) 100 MG capsule Take 100 mg daily by mouth.  . dofetilide (TIKOSYN) 500 MCG capsule TAKE 1 CAPSULE TWICE DAILY  . ELIQUIS 5 MG TABS tablet TAKE 1 TABLET TWICE DAILY  . GLUCOSAMINE SULFATE PO Take 2 tablets by mouth daily. Alternating for every 3 months  . hydrocortisone (ANUSOL-HC) 25 MG suppository Place 1 suppository (25 mg total) rectally daily as needed for hemorrhoids or anal itching.  Marland Kitchen KLOR-CON M20 20 MEQ tablet TAKE 1 TABLET FOUR TIMES DAILY  . metoprolol tartrate (LOPRESSOR) 25 MG tablet TAKE 1/2 TABLET (12.5 MG TOTAL) BY MOUTH 2 (TWO) TIMES DAILY.  . Multiple Vitamin (MULTIVITAMIN) tablet Take 1 tablet by mouth daily.   . Multiple Vitamins-Minerals (PRESERVISION AREDS 2 PO) Take 1 tablet by mouth 2 (two) times daily.  . pravastatin (PRAVACHOL) 20 MG tablet Take 1 tablet (20 mg total) by mouth daily. -- Office visit needed for further refills   No  facility-administered encounter medications on file as of 09/11/2017.     Activities of Daily Living In your present state of health, do you have any difficulty performing the following activities: 09/11/2017  Hearing? N  Vision? N  Difficulty concentrating or making decisions? N  Walking or climbing stairs? N  Dressing or bathing? N  Doing errands, shopping? N  Preparing Food and eating ? N  Using the Toilet? N  In the past six months, have you accidently leaked urine? N  Do you have problems with loss of bowel control? N  Managing your Medications? N  Managing your Finances? N  Housekeeping or managing your Housekeeping? N  Some recent data might be hidden    Patient Care Team: Binnie Rail, MD as PCP - General (Internal Medicine) Gaynelle Arabian, MD as Consulting Physician (Orthopedic Surgery) Freada Bergeron, CMA as Technician Melida Quitter, MD as Consulting Physician (Otolaryngology) Ladene Artist, MD as Consulting Physician (Gastroenterology) Thompson Grayer, MD as Consulting Physician (Cardiology)   Assessment:   This is a routine wellness examination for [place]. Physical assessment deferred to PCP.   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  Goals    . Patient Stated     I want to continue to work on my inside and outside projects which keeps me active. 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        Fall Risk Fall Risk  09/11/2017 09/05/2016 03/28/2016  08/24/2014 07/28/2012  Falls in the past year? No Yes No No No  Comment - - Emmi Telephone Survey: data to providers prior to load - -  Injury with Fall? - No - - -    Depression Screen PHQ 2/9 Scores 09/11/2017 09/05/2016 08/24/2014 07/28/2012  PHQ - 2 Score 0 0 0 0    Cognitive Function       Ad8 score reviewed for issues:  Issues making decisions: no  Less interest in hobbies / activities: no  Repeats questions, stories (family complaining): no  Trouble using ordinary gadgets (microwave, computer, phone):no  Forgets the month or year: no  Mismanaging finances: no  Remembering appts: no  Daily problems with thinking and/or memory: no Ad8 score is= 0  Immunization History  Administered Date(s) Administered  . Influenza Split 03/18/2011  . Influenza Whole 01/14/2010  . Influenza, High Dose Seasonal PF 02/15/2013, 01/26/2014, 02/16/2015, 01/16/2016, 01/01/2017  . Pneumococcal Conjugate-13 08/24/2014  . Pneumococcal Polysaccharide-23 02/04/2010  . Td 11/08/2009   Screening Tests Health Maintenance  Topic Date Due  . INFLUENZA VACCINE  11/19/2017  . TETANUS/TDAP  11/09/2019  . COLONOSCOPY  08/01/2020  . PNA vac Low Risk Adult  Completed      Plan:     Continue doing brain stimulating activities (puzzles, reading, adult coloring books, staying active) to keep memory sharp.   Continue to eat heart healthy diet (full of fruits, vegetables, whole grains, lean protein, water--limit salt, fat, and sugar intake) and increase physical activity as tolerated.   I have personally reviewed and noted the following in the patient's chart:   . Medical and social history . Use of alcohol, tobacco or illicit drugs  . Current medications and supplements . Functional ability and status . Nutritional status . Physical activity . Advanced directives . List of other physicians . Vitals . Screenings to include cognitive, depression, and falls . Referrals and appointments  In  addition, I have reviewed and  discussed with patient certain preventive protocols, quality metrics, and best practice recommendations. A written personalized care plan for preventive services as well as general preventive health recommendations were provided to patient.     Michiel Cowboy, RN  09/11/2017  Medical screening examination/treatment/procedure(s) were performed by non-physician practitioner and as supervising physician I was immediately available for consultation/collaboration. I agree with above. Binnie Rail, MD

## 2017-09-21 ENCOUNTER — Encounter: Payer: Self-pay | Admitting: Gastroenterology

## 2017-09-21 ENCOUNTER — Ambulatory Visit (INDEPENDENT_AMBULATORY_CARE_PROVIDER_SITE_OTHER): Payer: Medicare Other | Admitting: Gastroenterology

## 2017-09-21 VITALS — BP 130/60 | HR 76 | Ht 67.5 in | Wt 185.0 lb

## 2017-09-21 DIAGNOSIS — Z7901 Long term (current) use of anticoagulants: Secondary | ICD-10-CM

## 2017-09-21 DIAGNOSIS — K648 Other hemorrhoids: Secondary | ICD-10-CM

## 2017-09-21 NOTE — Patient Instructions (Signed)

## 2017-09-21 NOTE — Progress Notes (Signed)
PROCEDURE NOTE: The patient presents with symptomatic, bleeding grade 1 hemorrhoids, requesting rubber band ligation of their hemorrhoidal disease.  All risks, benefits and alternative forms of therapy were described and informed consent was obtained. Pt on Eliquis.   The anorectum was pre-medicated during digital exam with 0.125% NTG and 5% lidocaine. In the Left Lateral Decubitus position anoscopic examination revealed grade 1 hemorrhoids in the LL, RP, RA positions.   The decision was made to band the RA internal hemorrhoid, and the [place] was used to perform band ligation without complication. First 2 attempts the bands did not attach. The 3rd time the band attached.  Digital anorectal examination was then performed to assure proper positioning of the band and to adjust the banded tissue as required. No adjustment was needed.   No complications were encountered and the patient tolerated the procedure well.  Dietary and behavioral recommendations were given and along with follow-up instructions.   Return for possible additional hemorrhoid banding in 2 - 3 weeks as required.   High fiber diet, Benefiber daily and at least 8 glasses of water daily. The patient was discharged home without pain or other post procedure problems.

## 2017-09-28 ENCOUNTER — Other Ambulatory Visit: Payer: Self-pay | Admitting: Internal Medicine

## 2017-10-01 NOTE — Progress Notes (Signed)
Subjective:    Patient ID: Daniel Tucker, male    DOB: 1940-09-23, 77 y.o.   MRN: [id number]  HPI The patient is here for an acute visit.  Itchy bumps: For the past couple of months he has been having itchy, red bumps, on his arms and legs.  He does have one on his back.  They come and stay for a while. Initially he thought it was a mosiquito bite, but they keep coming.   He has applied hydrocortisone cream, which seems to help.  He denies any obvious changes in products.  His wife did recently changed detergents to see if that would help.    Afib, Hypertension: He is taking his medication daily. He is compliant with a low sodium diet.  He occasonally gets into Afib and will take an extra metoprolol and it goes back into sinus.  When he does get atrial fibrillation he will feel unsteady and then check his pulse and it is obvious he is in A. fib.  He denies chest pain, palpitations, edema, shortness of breath and regular headaches. He is exercising regularly -walking and yard work.  He does monitor his blood pressure at home - occ 140/80 - but usually lower.   Overall his blood pressure has been well controlled at home.  Hyperlipidemia: He is taking his medication daily. He is compliant with a low fat/cholesterol diet. He is exercising regularly. He denies myalgias.   Prediabetes:  He is compliant with a low sugar/carbohydrate diet.  He is exercising regularly.  Oral herpes: He continues to take the acyclovir daily and would like to continue it.  The herpes outbreaks are controlled.   Medications and allergies reviewed with patient and updated if appropriate.  Patient Active Problem List   Diagnosis Date Noted  . Prediabetes 09/05/2015  . 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 Arthritis of right hip 04/02/2015  . Atrial fibrillation ([place]) 08/24/2014  . Obstructive sleep apnea 07/28/2012  . Nonspecific abnormal electrocardiogram (ECG) (EKG) 05/15/2011  . Hyperlipidemia  10/10/2008  . Essential hypertension 10/10/2008  . History of colonic polyps 10/10/2008  . 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 acetaminophen (TYLENOL) 500 MG chewable tablet Chew 500 mg by mouth every 6 (six) hours as needed for pain.    Marland Kitchen acyclovir (ZOVIRAX) 200 MG capsule TAKE 2 CAPSULES EVERY DAY AS DIRECTED 180 capsule 3  . amLODipine (NORVASC) 5 MG tablet Take 1.5 tablets (7.5 mg total) by mouth daily. -- Office visit needed for further refills 135 tablet 0  . Ascorbic Acid (VITAMIN C) 1000 MG tablet Take 1,000 mg by mouth daily.      Marland Kitchen docusate sodium (COLACE) 100 MG capsule Take 100 mg daily by mouth.    . dofetilide (TIKOSYN) 500 MCG capsule TAKE 1 CAPSULE TWICE DAILY 180 capsule 1  . ELIQUIS 5 MG TABS tablet TAKE 1 TABLET TWICE DAILY 180 tablet 3  . GLUCOSAMINE SULFATE PO Take 2 tablets by mouth daily. Alternating for every 3 months    . hydrocortisone (ANUSOL-HC) 25 MG suppository Place 1 suppository (25 mg total) rectally daily as needed for hemorrhoids or anal itching. 30 suppository 1  . KLOR-CON M20 20 MEQ tablet TAKE 1 TABLET FOUR TIMES DAILY 360 tablet 3  . metoprolol tartrate (LOPRESSOR) 25 MG tablet TAKE 1/2 TABLET (12.5 MG TOTAL) BY MOUTH 2 (TWO) TIMES DAILY. 90 tablet  3  . Multiple Vitamin (MULTIVITAMIN) tablet Take 1 tablet by mouth daily.     . Multiple Vitamins-Minerals (PRESERVISION AREDS 2 PO) Take 1 tablet by mouth 2 (two) times daily.    . pravastatin (PRAVACHOL) 20 MG tablet Take 1 tablet (20 mg total) by mouth daily. 90 tablet 1   No current facility-administered medications on file prior to visit.     Past Medical History:  Diagnosis Date  . Arthritis    "hips" (11/20/2014)  . Benign essential HTN   . Fasting hyperglycemia 2006   FBS 111; normal A1c  . Hepatitis ~ 1955   "yellow juandice"  . Hyperlipidemia   . Internal hemorrhoids   . Persistent  atrial fibrillation ([place]) 08/24/2014   CHASD2VASC score is 3 - on Apixaban  . Sleep apnea    intolerant to CPAP and now using oral device followed by Dr. Toy Cookey  . Tubular adenoma of colon 2006   Dr Fuller Plan    Past Surgical History:  Procedure Laterality Date  . CARDIOVERSION N/A 10/12/2014   Procedure: CARDIOVERSION;  Surgeon: Darlin Coco, MD;  Location: [facility] ENDOSCOPY;  Service: Cardiovascular;  Laterality: N/A;  . COLONOSCOPY  2011   no polyp  . COLONOSCOPY W/ POLYPECTOMY  2006   [place]  . NASAL SINUS SURGERY  1997  . PROSTATE BIOPSY  2010   Negative,Dr.Ottelin  . TONSILLECTOMY AND ADENOIDECTOMY  1997  . 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 UVULOPALATOPHARYNGOPLASTY  1997   Dr.Woliki    Social History   Socioeconomic History  . Marital status: Married    Spouse name: Not on file  . Number of children: 3  . Years of education: Not on file  . Highest education level: Not on file  Occupational History  . Not on file  Social Needs  . Financial resource strain: Not hard at all  . Food insecurity:    Worry: Never true    Inability: Never true  . Transportation needs:    Medical: No    Non-medical: No  Tobacco Use  . Smoking status: Former Smoker    Packs/day: 1.00    Years: 7.00    Pack years: 7.00    Types: Cigarettes  . Smokeless tobacco: Never Used  . Tobacco comment: smoked cigarettes age [age], up to 1ppd  Substance and Sexual Activity  . Alcohol use: Yes    Alcohol/week: 1.2 oz    Types: 1 Glasses of wine, 1 Cans of beer per week    Comment: less than 1 drink per week  . Drug use: No  . Sexual activity: Yes  Lifestyle  . Physical activity:    Days per week: 4 days    Minutes per session: 50 min  . Stress: Not at all  Relationships  . Social connections:    Talks on phone: More than three times a week    Gets together: More than three times a week    Attends religious service: More than 4 times per year    Active member of club or  organization: Yes    Attends meetings of clubs or organizations: More than 4 times per year    Relationship status: Married  Other Topics Concern  . Not on file  Social History Narrative  . Not on file    Family History  Problem Relation Age of Onset  . Prostate cancer Father   . Hypertension Father   . Hypertension Mother   . Prostate  cancer Brother   . Melanoma Sister   . Atrial fibrillation Brother   . Melanoma Brother   . Lupus Sister   . Diabetes Neg Hx   . Heart disease Neg Hx   . Stroke Neg Hx   . Colon cancer Neg Hx     Review of Systems  Constitutional: Negative for chills and fever.  Respiratory: Negative for cough, shortness of breath and wheezing.   Cardiovascular: Negative for chest pain, palpitations and leg swelling.  Gastrointestinal:       No gerd  Skin: Positive for rash (bump on legs/arms).  Neurological: Negative for light-headedness and headaches.       Objective:   Vitals:   10/02/17 0914  BP: (!) 162/84  Pulse: 61  Resp: 16  Temp: 98.1 F (36.7 C)  SpO2: 98%   BP Readings from Last 3 Encounters:  10/02/17 (!) 162/84  09/21/17 130/60  09/11/17 (!) 142/64   Wt Readings from Last 3 Encounters:  10/02/17 187 lb (84.8 kg)  09/21/17 185 lb (83.9 kg)  09/11/17 188 lb (85.3 kg)   Body mass index is 28.86 kg/m.   Physical Exam    Constitutional: Appears well-developed and well-nourished. No distress.  HENT:  Head: Normocephalic and atraumatic.  Neck: Neck supple. No tracheal deviation present. No thyromegaly present.  No cervical lymphadenopathy Cardiovascular: Normal rate, regular rhythm and normal heart sounds.   No murmur heard. No carotid bruit .  No edema Pulmonary/Chest: Effort normal and breath sounds normal. No respiratory distress. No has no wheezes. No rales.  Skin: Skin is warm and dry. Not diaphoretic. Few erythematous papules on arms and legs-a couple scabbed more just areas of induration Psychiatric: Normal mood and  affect. Behavior is normal.       Assessment & Plan:    See Problem List for Assessment and Plan of chronic medical problems.

## 2017-10-01 NOTE — Patient Instructions (Addendum)
  Test(s) ordered today. Your results will be released to [place] (or called to you) after review, usually within 72hours after test completion. If any changes need to be made, you will be notified at that same time.  All other Health Maintenance issues reviewed.   All recommended immunizations and age-appropriate screenings are up-to-date or discussed.  No immunizations administered today.   Medications reviewed and updated.  Try a stronger steroid cream for the itchy bites. .  Your prescription(s) have been submitted to your pharmacy. Please take as directed and contact our office if you believe you are having problem(s) with the medication(s).    Please followup in one year

## 2017-10-02 ENCOUNTER — Encounter: Payer: Self-pay | Admitting: Internal Medicine

## 2017-10-02 ENCOUNTER — Ambulatory Visit (INDEPENDENT_AMBULATORY_CARE_PROVIDER_SITE_OTHER): Payer: Medicare Other | Admitting: Internal Medicine

## 2017-10-02 ENCOUNTER — Other Ambulatory Visit (INDEPENDENT_AMBULATORY_CARE_PROVIDER_SITE_OTHER): Payer: Medicare Other

## 2017-10-02 VITALS — BP 162/84 | HR 61 | Temp 98.1°F | Resp 16 | Ht 67.5 in | Wt 187.0 lb

## 2017-10-02 DIAGNOSIS — R7303 Prediabetes: Secondary | ICD-10-CM

## 2017-10-02 DIAGNOSIS — E782 Mixed hyperlipidemia: Secondary | ICD-10-CM

## 2017-10-02 DIAGNOSIS — I10 Essential (primary) hypertension: Secondary | ICD-10-CM | POA: Diagnosis not present

## 2017-10-02 DIAGNOSIS — L509 Urticaria, unspecified: Secondary | ICD-10-CM

## 2017-10-02 DIAGNOSIS — B002 Herpesviral gingivostomatitis and pharyngotonsillitis: Secondary | ICD-10-CM

## 2017-10-02 DIAGNOSIS — I481 Persistent atrial fibrillation: Secondary | ICD-10-CM

## 2017-10-02 DIAGNOSIS — I4819 Other persistent atrial fibrillation: Secondary | ICD-10-CM

## 2017-10-02 LAB — COMPREHENSIVE METABOLIC PANEL
ALT: 14 U/L (ref 0–53)
AST: 16 U/L (ref 0–37)
Albumin: 4.4 g/dL (ref 3.5–5.2)
Alkaline Phosphatase: 69 U/L (ref 39–117)
BUN: 19 mg/dL (ref 6–23)
CALCIUM: 9.7 mg/dL (ref 8.4–10.5)
CHLORIDE: 101 meq/L (ref 96–112)
CO2: 28 meq/L (ref 19–32)
Creatinine, Ser: 1.01 mg/dL (ref 0.40–1.50)
GFR: 76.27 mL/min (ref >60.00)
Glucose, Bld: 114 mg/dL — ABNORMAL HIGH (ref 70–99)
Potassium: 4.1 mEq/L (ref 3.5–5.1)
Sodium: 139 mEq/L (ref 135–145)
Total Bilirubin: 0.6 mg/dL (ref 0.2–1.2)
Total Protein: 7.5 g/dL (ref 6.0–8.3)

## 2017-10-02 LAB — TSH: TSH: 1.12 u[IU]/mL (ref 0.35–4.50)

## 2017-10-02 LAB — HEMOGLOBIN A1C: HEMOGLOBIN A1C: 6 % (ref 4.6–6.5)

## 2017-10-02 LAB — CBC WITH DIFFERENTIAL/PLATELET
BASOS PCT: 0.1 % (ref 0.0–3.0)
Basophils Absolute: 0 10*3/uL (ref 0.0–0.1)
Eosinophils Absolute: 0.2 10*3/uL (ref 0.0–0.7)
Eosinophils Relative: 3.1 % (ref 0.0–5.0)
HEMATOCRIT: 43.1 % (ref 39.0–52.0)
Hemoglobin: 14.8 g/dL (ref 13.0–17.0)
LYMPHS PCT: 26.9 % (ref 12.0–46.0)
Lymphs Abs: 1.5 10*3/uL (ref 0.7–4.0)
MCHC: 34.3 g/dL (ref 30.0–36.0)
MCV: 96.2 fl (ref 78.0–100.0)
MONOS PCT: 10.7 % (ref 3.0–12.0)
Monocytes Absolute: 0.6 10*3/uL (ref 0.1–1.0)
NEUTROS ABS: 3.2 10*3/uL (ref 1.4–7.7)
Neutrophils Relative %: 59.2 % (ref 43.0–77.0)
PLATELETS: 235 10*3/uL (ref 150.0–400.0)
RBC: 4.48 Mil/uL (ref 4.22–5.81)
RDW: 12.7 % (ref 11.5–15.5)
WBC: 5.4 10*3/uL (ref 4.0–10.5)

## 2017-10-02 LAB — LIPID PANEL
CHOL/HDL RATIO: 3
Cholesterol: 168 mg/dL (ref 0–200)
HDL: 54.5 mg/dL (ref >39.00)
LDL Cholesterol: 95 mg/dL (ref 0–99)
NONHDL: 113.94
Triglycerides: 94 mg/dL (ref 0.0–149.0)
VLDL: 18.8 mg/dL (ref 0.0–40.0)

## 2017-10-02 MED ORDER — ACYCLOVIR 200 MG PO CAPS: ORAL_CAPSULE | ORAL | 3 refills | Status: DC

## 2017-10-02 MED ORDER — PRAVASTATIN SODIUM 20 MG PO TABS: 20.0000 mg | ORAL_TABLET | Freq: Every day | ORAL | 3 refills | Status: DC

## 2017-10-02 MED ORDER — TRIAMCINOLONE ACETONIDE 0.5 % EX OINT: 1.0000 "application " | TOPICAL_OINTMENT | Freq: Two times a day (BID) | CUTANEOUS | 0 refills | Status: DC

## 2017-10-02 MED ORDER — AMLODIPINE BESYLATE 5 MG PO TABS: 7.5000 mg | ORAL_TABLET | Freq: Every day | ORAL | 3 refills | Status: DC

## 2017-10-02 NOTE — Assessment & Plan Note (Signed)
Check a1c Low sugar / carb diet Stressed regular exercise   

## 2017-10-02 NOTE — Assessment & Plan Note (Signed)
Check lipid panel  Continue daily statin Regular exercise and healthy diet encouraged  

## 2017-10-02 NOTE — Assessment & Plan Note (Signed)
Rate controlled Currently in sinus Has occasional episodes of atrial fibrillation-feels unsteady and then can identify that his heart is irregular He takes an extra dose of metoprolol and he quickly goes back into sinus Taking Eliquis, Tikosyn CMP, CBC, TSH

## 2017-10-02 NOTE — Assessment & Plan Note (Signed)
Elevated here, but has been well controlled at home Encouraged him to continue to monitor at home closely and make sure that it is well controlled Continue current medication at current doses CMP

## 2017-10-02 NOTE — Assessment & Plan Note (Signed)
Controlled Continue acyclovir daily

## 2017-10-02 NOTE — Assessment & Plan Note (Signed)
Primarily on arms and legs for the past 2 months Unlikely bug bites-resemble more hives No obvious cause Advised taking an over-the-counter antihistamine daily Triamcinolone ointment twice daily as needed, which should be more effective than over-the-counter hydrocortisone If persists can refer to dermatology

## 2017-10-05 ENCOUNTER — Encounter: Payer: Self-pay | Admitting: Emergency Medicine

## 2017-10-14 ENCOUNTER — Encounter: Payer: Self-pay | Admitting: Gastroenterology

## 2017-10-14 ENCOUNTER — Ambulatory Visit (INDEPENDENT_AMBULATORY_CARE_PROVIDER_SITE_OTHER): Payer: Medicare Other | Admitting: Gastroenterology

## 2017-10-14 VITALS — BP 140/74 | HR 76 | Ht 69.0 in | Wt 184.0 lb

## 2017-10-14 DIAGNOSIS — K648 Other hemorrhoids: Secondary | ICD-10-CM

## 2017-10-14 NOTE — Patient Instructions (Signed)
Start Benefiber daily.   HEMORRHOID BANDING PROCEDURE    FOLLOW-UP CARE   1. The procedure you have had should have been relatively painless since the banding of the area involved does not have nerve endings and there is no pain sensation.  The rubber band cuts off the blood supply to the hemorrhoid and the band may fall off as soon as 48 hours after the banding (the band may occasionally be seen in the toilet bowl following a bowel movement). You may notice a temporary feeling of fullness in the rectum which should respond adequately to plain Tylenol or Motrin.  2. Following the banding, avoid strenuous exercise that evening and resume full activity the next day.  A sitz bath (soaking in a warm tub) or bidet is soothing, and can be useful for cleansing the area after bowel movements.     3. To avoid constipation, take two tablespoons of natural wheat bran, natural oat bran, flax, Benefiber or any over the counter fiber supplement and increase your water intake to 7-8 glasses daily.    4. Unless you have been prescribed anorectal medication, do not put anything inside your rectum for two weeks: No suppositories, enemas, fingers, etc.  5. Occasionally, you may have more bleeding than usual after the banding procedure.  This is often from the untreated hemorrhoids rather than the treated one.  Don't be concerned if there is a tablespoon or so of blood.  If there is more blood than this, lie flat with your bottom higher than your head and apply an ice pack to the area. If the bleeding does not stop within a half an hour or if you feel faint, call our office at [phone] or go to the emergency room.  6. Problems are not common; however, if there is a substantial amount of bleeding, severe pain, chills, fever or difficulty passing urine (very rare) or other problems, you should call us at [phone] or report to the nearest emergency room.  7. Do not stay seated continuously for more than  2-3 hours for a day or two after the procedure.  Tighten your buttock muscles 10-15 times every two hours and take 10-15 deep breaths every 1-2 hours.  Do not spend more than a few minutes on the toilet if you cannot empty your bowel; instead re-visit the toilet at a later time.

## 2017-10-14 NOTE — Progress Notes (Signed)
PROCEDURE NOTE: The patient presents with symptomatic, bleeding grade 1 hemorrhoids, requesting rubber band ligation of their hemorrhoidal disease.  All risks, benefits and alternative forms of therapy were described and informed consent was obtained. Pt consent to banding on Eliquis.   The anorectum was pre-medicated during digital exam with 0.125% NTG and 5% lidocaine with the patient in the Left Lateral Decubitus position.  The decision was made to band the RP internal hemorrhoid, and the [place] was used to perform band ligation without complication.   Digital anorectal examination was then performed to assure proper positioning of the band and to adjust the banded tissue as required. No adjustment was needed.  No complications were encountered and the patient tolerated the procedure well.  Dietary and behavioral recommendations were given and along with follow-up instructions.   Return for possible additional hemorrhoid banding in 2 - 3 weeks.  High fiber diet, Benefiber daily and at least 8 glasses of water daily. The patient was discharged home without pain or other post procedure problems.

## 2017-10-22 ENCOUNTER — Other Ambulatory Visit (HOSPITAL_COMMUNITY): Payer: Self-pay | Admitting: Nurse Practitioner

## 2017-11-02 ENCOUNTER — Ambulatory Visit (INDEPENDENT_AMBULATORY_CARE_PROVIDER_SITE_OTHER): Payer: Medicare Other | Admitting: Gastroenterology

## 2017-11-02 ENCOUNTER — Encounter: Payer: Self-pay | Admitting: Gastroenterology

## 2017-11-02 VITALS — BP 114/70 | HR 80 | Ht 67.5 in | Wt 184.4 lb

## 2017-11-02 DIAGNOSIS — K648 Other hemorrhoids: Secondary | ICD-10-CM

## 2017-11-02 DIAGNOSIS — K640 First degree hemorrhoids: Secondary | ICD-10-CM

## 2017-11-02 NOTE — Patient Instructions (Signed)

## 2017-11-02 NOTE — Progress Notes (Signed)
PROCEDURE NOTE: The patient presents with symptomatic bleeding grade 1 hemorrhoids, requesting rubber band ligation of his hemorrhoids.  He related no bleeding since his banding #2 in late June. All risks, benefits and alternative forms of therapy were described and informed consent was obtained. He consents to hemorrhoidal banding on Eliquis.   The anorectum was pre-medicated during digital exam with 0.125% NTG and 5% lidocaine.   The decision was made to band the LL internal hemorrhoid, and the [place] was used to perform band ligation without complication.  Digital anorectal examination was then performed to assure proper positioning of the band and to adjust the banded tissue as required. No adjustment was needed.   No complications were encountered and the patient tolerated the procedure well.  Dietary and behavioral recommendations were given and along with follow-up instructions.   Follow up as needed.  High fiber diet, Benefiber daily and/or Colace daily and at least 8 glasses of water daily. The patient was discharged home without pain or other post procedure problems.

## 2017-11-16 ENCOUNTER — Other Ambulatory Visit (HOSPITAL_COMMUNITY): Payer: Self-pay | Admitting: Nurse Practitioner

## 2017-12-07 ENCOUNTER — Ambulatory Visit (HOSPITAL_COMMUNITY)
Admission: RE | Admit: 2017-12-07 | Discharge: 2017-12-07 | Disposition: A | Discharge status: Home / Self Care | Payer: Medicare Other | Source: Ambulatory Visit | Attending: Nurse Practitioner | Admitting: Nurse Practitioner

## 2017-12-07 ENCOUNTER — Encounter (HOSPITAL_COMMUNITY): Payer: Self-pay | Admitting: Nurse Practitioner

## 2017-12-07 VITALS — BP 120/78 | HR 60 | Ht 67.5 in | Wt 185.0 lb

## 2017-12-07 DIAGNOSIS — I4510 Unspecified right bundle-branch block: Secondary | ICD-10-CM | POA: Insufficient documentation

## 2017-12-07 DIAGNOSIS — Z87891 Personal history of nicotine dependence: Secondary | ICD-10-CM | POA: Diagnosis not present

## 2017-12-07 DIAGNOSIS — Z7901 Long term (current) use of anticoagulants: Secondary | ICD-10-CM | POA: Insufficient documentation

## 2017-12-07 DIAGNOSIS — I4819 Other persistent atrial fibrillation: Secondary | ICD-10-CM

## 2017-12-07 DIAGNOSIS — I10 Essential (primary) hypertension: Secondary | ICD-10-CM | POA: Diagnosis not present

## 2017-12-07 DIAGNOSIS — Z79899 Other long term (current) drug therapy: Secondary | ICD-10-CM | POA: Insufficient documentation

## 2017-12-07 DIAGNOSIS — G4733 Obstructive sleep apnea (adult) (pediatric): Secondary | ICD-10-CM | POA: Diagnosis not present

## 2017-12-07 DIAGNOSIS — I481 Persistent atrial fibrillation: Secondary | ICD-10-CM | POA: Diagnosis not present

## 2017-12-07 DIAGNOSIS — I480 Paroxysmal atrial fibrillation: Secondary | ICD-10-CM | POA: Insufficient documentation

## 2017-12-07 DIAGNOSIS — E785 Hyperlipidemia, unspecified: Secondary | ICD-10-CM | POA: Diagnosis not present

## 2017-12-07 DIAGNOSIS — Z96642 Presence of left artificial hip joint: Secondary | ICD-10-CM | POA: Diagnosis not present

## 2017-12-07 LAB — BASIC METABOLIC PANEL
Anion gap: 7 (ref 5–15)
BUN: 16 mg/dL (ref 8–23)
CO2: 27 mmol/L (ref 22–32)
CREATININE: 1.12 mg/dL (ref 0.61–1.24)
Calcium: 9.3 mg/dL (ref 8.9–10.3)
Chloride: 105 mmol/L (ref 98–111)
Glucose, Bld: 145 mg/dL — ABNORMAL HIGH (ref 70–99)
POTASSIUM: 4 mmol/L (ref 3.5–5.1)
SODIUM: 139 mmol/L (ref 135–145)

## 2017-12-07 LAB — MAGNESIUM: MAGNESIUM: 2.2 mg/dL (ref 1.7–2.4)

## 2017-12-07 NOTE — Progress Notes (Signed)
Patient ID: Daniel Tucker, male   DOB: 1940/10/16, 77 y.o.   MRN: [id number]     Primary Care Physician: Binnie Rail, MD Referring Physician: Dr. Jovita Kussmaul is a 77 y.o. male with a h/o paroxysmal afib maintaining SR on tikosyn. He reports that he feels well.No issues with blood thinner.  1short episode  of afib that responded well to small dose of increase in BB. Using mouth piece for sleep apnea followed thru dentist, Dr. Toy Cookey.   Qtc stable.  Today, he denies symptoms of palpitations, chest pain, shortness of breath, orthopnea, PND, lower extremity edema, dizziness, presyncope, syncope, or neurologic sequela. The patient is tolerating medications without difficulties and is otherwise without complaint today.   Past Medical History:  Diagnosis Date  . Arthritis    "hips" (11/20/2014)  . Benign essential HTN   . Fasting hyperglycemia 2006   FBS 111; normal A1c  . Hepatitis ~ 1955   "yellow juandice"  . Hyperlipidemia   . Internal hemorrhoids   . Persistent atrial fibrillation ([place]) 08/24/2014   CHASD2VASC score is 3 - on Apixaban  . Sleep apnea    intolerant to CPAP and now using oral device followed by Dr. Toy Cookey  . Tubular adenoma of colon 2006   Dr Fuller Plan   Past Surgical History:  Procedure Laterality Date  . CARDIOVERSION N/A 10/12/2014   Procedure: CARDIOVERSION;  Surgeon: Darlin Coco, MD;  [facility] ENDOSCOPY;  Service: Cardiovascular;  Laterality: N/A;  . COLONOSCOPY  2011   no polyp  . COLONOSCOPY W/ POLYPECTOMY  2006   [place]  . NASAL SINUS SURGERY  1997  . PROSTATE BIOPSY  2010   Negative,Dr.Ottelin  . TONSILLECTOMY AND ADENOIDECTOMY  1997  . 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 UVULOPALATOPHARYNGOPLASTY  1997   Dr.Woliki    Current Outpatient Medications  Medication Sig Dispense Refill  . acetaminophen (TYLENOL) 500 MG chewable tablet Chew 500 mg by mouth every 6 (six) hours as needed for pain.    Marland Kitchen acyclovir  (ZOVIRAX) 200 MG capsule TAKE 2 CAPSULES EVERY DAY AS DIRECTED 180 capsule 3  . amLODipine (NORVASC) 5 MG tablet Take 1.5 tablets (7.5 mg total) by mouth daily. 135 tablet 3  . Ascorbic Acid (VITAMIN C) 1000 MG tablet Take 1,000 mg by mouth daily.      Marland Kitchen docusate sodium (COLACE) 100 MG capsule Take 100 mg daily by mouth.    . dofetilide (TIKOSYN) 500 MCG capsule TAKE 1 CAPSULE TWICE DAILY 180 capsule 1  . ELIQUIS 5 MG TABS tablet TAKE 1 TABLET TWICE DAILY 180 tablet 3  . GLUCOSAMINE SULFATE PO Take 2 tablets by mouth daily. Alternating for every 3 months    . metoprolol tartrate (LOPRESSOR) 25 MG tablet TAKE 1/2 TABLET (12.5 MG TOTAL) BY MOUTH 2 (TWO) TIMES DAILY. 90 tablet 3  . Multiple Vitamin (MULTIVITAMIN) tablet Take 1 tablet by mouth daily.     . Multiple Vitamins-Minerals (PRESERVISION AREDS 2 PO) Take 1 tablet by mouth 2 (two) times daily.    . potassium chloride SA (K-DUR,KLOR-CON) 20 MEQ tablet TAKE 1 TABLET FOUR TIMES DAILY 360 tablet 3  . pravastatin (PRAVACHOL) 20 MG tablet Take 1 tablet (20 mg total) by mouth daily. 90 tablet 3  . triamcinolone ointment (KENALOG) 0.5 % Apply 1 application topically 2 (two) times daily. (Patient taking differently: Apply 1 application topically 2 (two) times daily as needed. ) 30 g 0  .  hydrocortisone (ANUSOL-HC) 25 MG suppository Place 1 suppository (25 mg total) rectally daily as needed for hemorrhoids or anal itching. (Patient not taking: Reported on 12/07/2017) 30 suppository 1   No current facility-administered medications for this encounter.     No Known Allergies  Social History   Socioeconomic History  . Marital status: Married    Spouse name: Not on file  . Number of children: 3  . Years of education: Not on file  . Highest education level: Not on file  Occupational History  . Occupation: retired   Scientific laboratory technician  . Financial resource strain: Not hard at all  . Food insecurity:    Worry: Never true    Inability: Never true  .  Transportation needs:    Medical: No    Non-medical: No  Tobacco Use  . Smoking status: Former Smoker    Packs/day: 1.00    Years: 7.00    Pack years: 7.00    Types: Cigarettes  . Smokeless tobacco: Never Used  . Tobacco comment: smoked cigarettes age [age], up to 1ppd  Substance and Sexual Activity  . Alcohol use: Yes    Alcohol/week: 2.0 standard drinks    Types: 1 Glasses of wine, 1 Cans of beer per week    Comment: less than 1 drink per week  . Drug use: No  . Sexual activity: Yes  Lifestyle  . Physical activity:    Days per week: 4 days    Minutes per session: 50 min  . Stress: Not at all  Relationships  . Social connections:    Talks on phone: More than three times a week    Gets together: More than three times a week    Attends religious service: More than 4 times per year    Active member of club or organization: Yes    Attends meetings of clubs or organizations: More than 4 times per year    Relationship status: Married  . Intimate partner violence:    Fear of current or ex partner: No    Emotionally abused: No    Physically abused: No    Forced sexual activity: No  Other Topics Concern  . Not on file  Social History Narrative  . Not on file    Family History  Problem Relation Age of Onset  . Prostate cancer Father   . Hypertension Father   . Hypertension Mother   . Prostate cancer Brother   . Melanoma Sister   . Atrial fibrillation Brother   . Melanoma Brother   . Lupus Sister   . Diabetes Neg Hx   . Heart disease Neg Hx   . Stroke Neg Hx   . Colon cancer Neg Hx     ROS- All systems are reviewed and negative except as per the HPI above  Physical Exam: Vitals:   12/07/17 1420  BP: 120/78  Pulse: 60  Weight: 83.9 kg  Height: 5' 7.5" (1.715 m)    GEN- The patient is well appearing, alert and oriented x 3 today.   Head- normocephalic, atraumatic Eyes-  Sclera clear, conjunctiva pink Ears- hearing intact Oropharynx- clear Neck- supple, no  JVP Lymph- no cervical lymphadenopathy Lungs- Clear to ausculation bilaterally, normal work of breathing Heart- regular rate and rhythm  no murmurs, rubs or gallops, PMI not laterally displaced GI- soft, NT, ND, + BS Extremities- no clubbing, cyanosis, or edema MS- no significant deformity or atrophy Skin- no rash or lesion Psych- euthymic mood, full affect  Neuro- strength and sensation are intact  EKG- Sinus rhythm  at 61 bpm, LAD, RBBB,  pr int 128 ms, qrs int 128 ms, qtc 441 ms(stable).  Epic records reviewed.  Assessment and Plan: 1. Afib Continue on tikosyn 500 mg bid, qtc stable Continue apixaban, no bleeding issues Bmet/mag today  2.OSA Still wearing mouthpiece  3. HTN  Stable Avoid salt  4. Asymptomatic brady Improved with reduction of metoprolol   F/u in 6 months in afib clinic   Julio Zappia C. Ronie Fleeger, [facility] [address] [place], [place] [address] [phone]

## 2017-12-15 ENCOUNTER — Telehealth: Payer: Self-pay | Admitting: Internal Medicine

## 2017-12-15 NOTE — Telephone Encounter (Signed)
Patient's wife called and asked if the patient can receive the Shingrix vaccine, the dead shingles vaccine. I advised someone from the office will call with the appointment, she verbalized understanding.

## 2017-12-17 NOTE — Telephone Encounter (Signed)
Is there any reason pt can no received this vaccine?

## 2017-12-17 NOTE — Telephone Encounter (Signed)
He can receive this.

## 2017-12-24 MED ORDER — ZOSTER VAC RECOMB ADJUVANTED 50 MCG/0.5ML IM SUSR: 0.5000 mL | Freq: Once | INTRAMUSCULAR | 1 refills | Status: AC

## 2017-12-24 NOTE — Telephone Encounter (Signed)
Spoke with pt's wife to inform. RX has been sent to [facility].

## 2017-12-24 NOTE — Addendum Note (Signed)
Addended by: Terence Lux B on: 12/24/2017 01:52 PM   Modules accepted: Orders

## 2017-12-29 ENCOUNTER — Other Ambulatory Visit (HOSPITAL_COMMUNITY): Payer: Self-pay | Admitting: Nurse Practitioner

## 2017-12-29 DIAGNOSIS — I480 Paroxysmal atrial fibrillation: Secondary | ICD-10-CM

## 2018-01-13 DIAGNOSIS — Z23 Encounter for immunization: Secondary | ICD-10-CM | POA: Diagnosis not present

## 2018-02-09 DIAGNOSIS — M1611 Unilateral primary osteoarthritis, right hip: Secondary | ICD-10-CM | POA: Diagnosis not present

## 2018-03-15 ENCOUNTER — Other Ambulatory Visit (HOSPITAL_COMMUNITY): Payer: Self-pay | Admitting: Nurse Practitioner

## 2018-03-26 DIAGNOSIS — M1611 Unilateral primary osteoarthritis, right hip: Secondary | ICD-10-CM | POA: Diagnosis not present

## 2018-03-30 ENCOUNTER — Telehealth: Payer: Self-pay | Admitting: *Deleted

## 2018-03-30 NOTE — Telephone Encounter (Signed)
   [facility] Pre-operative Risk Assessment    Request for surgical clearance:  1. What type of surgery is being performed? RIGHT TOTAL HIP   2. When is this surgery scheduled? 06/23/18   3. Are there any medications that need to be held prior to surgery and how long? ELIQUIS   4. Practice name and name of physician performing surgery? [facility]; DR. Wynelle Link   5. What is your office phone and fax number? PH# [phone]; FAX# [phone]   6. Anesthesia type (None, local, MAC, general) ? CHOICE   Daniel Tucker 03/30/2018, 4:08 PM  _________________________________________________________________   (provider comments below)

## 2018-03-31 NOTE — Telephone Encounter (Signed)
Patient with diagnosis of Afib on Eliquis for anticoagulation.    Procedure: right total hip Date of procedure: 06/23/18  CHADS2-VASc score of   (CHF, HTN, AGE, DM2, stroke/tia x 2, CAD, AGE, male)  CrCl 22ml/min  Per office protocol, patient can hold Eliquis for 3 days prior to procedure.

## 2018-04-05 ENCOUNTER — Other Ambulatory Visit: Payer: Self-pay | Admitting: Internal Medicine

## 2018-04-23 DIAGNOSIS — M1611 Unilateral primary osteoarthritis, right hip: Secondary | ICD-10-CM | POA: Diagnosis not present

## 2018-04-23 DIAGNOSIS — M25551 Pain in right hip: Secondary | ICD-10-CM | POA: Diagnosis not present

## 2018-04-26 ENCOUNTER — Telehealth: Payer: Self-pay | Admitting: Internal Medicine

## 2018-04-26 NOTE — Telephone Encounter (Signed)
Copied from [place] [id number]. Topic: General - Inquiry >> Apr 26, 2018  9:17 AM Reyne Dumas L wrote: Reason for CRM:   Pt is having hip replacement surgery on 05/17/2018.  Pt wants to make sure that he doesn't need to have any type of surgical clearance appt prior to.  States that surgeon (Dr. Maureen Ralphs) was supposed to fax over paperwork for Dr. Quay Burow and pt is just checking to see if he needs to come in or not. Pt can be reached at [phone] or [phone]

## 2018-04-26 NOTE — Telephone Encounter (Signed)
Will you please call pt to set up surgical clearance. He is set to have right total hip replacement on 06/23/18. Thank you.

## 2018-04-26 NOTE — Telephone Encounter (Signed)
LVM for patient to call back to make appt  °

## 2018-05-05 NOTE — H&P (Signed)
TOTAL HIP ADMISSION H&P  Patient is admitted for right total hip arthroplasty.  Subjective:  Chief Complaint: right hip pain  HPI: Daniel Tucker, 78 y.o. male, has a history of pain and functional disability in the right hip(s) due to arthritis and patient has failed non-surgical conservative treatments for greater than 12 weeks to include corticosteriod injections and activity modification.  Onset of symptoms was gradual starting 4 years ago with gradually worsening course since that time.The patient noted no past surgery on the right hip(s).  Patient currently rates pain in the right hip at 8 out of 10 with activity. Patient has worsening of pain with activity and weight bearing, pain that interfers with activities of daily living and instability. Patient has evidence of severe bone-on-bone arthritis in the right hip with large osteophyte and subchondral cystic formation by imaging studies. This condition presents safety issues increasing the risk of falls. There is no current active infection.  Patient Active Problem List   Diagnosis Date Noted  . Hives 10/02/2017  . Prediabetes 09/05/2015  . 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 Arthritis of right hip 04/02/2015  . Atrial fibrillation ([place]) 08/24/2014  . Obstructive sleep apnea 07/28/2012  . Nonspecific abnormal electrocardiogram (ECG) (EKG) 05/15/2011  . Hyperlipidemia 10/10/2008  . Essential hypertension 10/10/2008  . History of colonic polyps 10/10/2008  . 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 Arthritis    "hips" (11/20/2014)  . Benign essential HTN   . Fasting hyperglycemia 2006   FBS 111; normal A1c  . Hepatitis ~ 1955   "yellow juandice"  . Hyperlipidemia   . Internal hemorrhoids   . Persistent atrial fibrillation ([place]) 08/24/2014   CHASD2VASC score is 3 - on Apixaban  . Sleep apnea    intolerant to CPAP and now using oral device followed by Dr. Toy Cookey  .  Tubular adenoma of colon 2006   Dr Fuller Plan    Past Surgical History:  Procedure Laterality Date  . CARDIOVERSION N/A 10/12/2014   Procedure: CARDIOVERSION;  Surgeon: Darlin Coco, MD;  Location: [facility] ENDOSCOPY;  Service: Cardiovascular;  Laterality: N/A;  . COLONOSCOPY  2011   no polyp  . COLONOSCOPY W/ POLYPECTOMY  2006   [place]  . NASAL SINUS SURGERY  1997  . PROSTATE BIOPSY  2010   Negative,Dr.Ottelin  . TONSILLECTOMY AND ADENOIDECTOMY  1997  . 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 UVULOPALATOPHARYNGOPLASTY  1997   Dr.Woliki    No current facility-administered medications for this encounter.    Current Outpatient Medications  Medication Sig Dispense Refill Last Dose  . acetaminophen (TYLENOL) 500 MG tablet Take 500 mg by mouth every 6 (six) hours as needed for moderate pain or headache.    Taking  . acyclovir (ZOVIRAX) 200 MG capsule TAKE 2 CAPSULES EVERY DAY AS DIRECTED (Patient taking differently: Take 400 mg by mouth daily. ) 180 capsule 3 Taking  . amLODipine (NORVASC) 5 MG tablet Take 1.5 tablets (7.5 mg total) by mouth daily. 135 tablet 3 Taking  . Ascorbic Acid (VITAMIN C) 1000 MG tablet Take 1,000 mg by mouth daily.     Taking  . docusate sodium (COLACE) 100 MG capsule Take 100 mg daily by mouth.   Taking  . dofetilide (TIKOSYN) 500 MCG capsule TAKE 1 CAPSULE TWICE DAILY (Patient taking differently: Take 500 mcg by mouth 2 (two) times daily. ) 180 capsule 1   . ELIQUIS 5 MG  TABS tablet TAKE 1 TABLET TWICE DAILY (Patient taking differently: Take 5 mg by mouth 2 (two) times daily. ) 180 tablet 3 Taking  . GLUCOSAMINE SULFATE PO Take 2 tablets by mouth daily.    Taking  . metoprolol tartrate (LOPRESSOR) 25 MG tablet TAKE 1/2 TABLET TWICE DAILY (Patient taking differently: Take 12.5 mg by mouth 2 (two) times daily. ) 90 tablet 3   . Multiple Vitamin (MULTIVITAMIN) tablet Take 1 tablet by mouth daily.    Taking  . Multiple Vitamins-Minerals (PRESERVISION AREDS 2  PO) Take 1 capsule by mouth 2 (two) times daily.    Taking  . potassium chloride SA (K-DUR,KLOR-CON) 20 MEQ tablet TAKE 1 TABLET FOUR TIMES DAILY (Patient taking differently: Take 20 mEq by mouth 4 (four) times daily. ) 360 tablet 3 Taking  . pravastatin (PRAVACHOL) 20 MG tablet TAKE 1 TABLET EVERY DAY (Patient taking differently: Take 20 mg by mouth daily. ) 90 tablet 2   . triamcinolone ointment (KENALOG) 0.5 % Apply 1 application topically 2 (two) times daily. (Patient taking differently: Apply 1 application topically 2 (two) times daily as needed (for itching). ) 30 g 0 Taking   No Known Allergies  Social History   Tobacco Use  . Smoking status: Former Smoker    Packs/day: 1.00    Years: 7.00    Pack years: 7.00    Types: Cigarettes  . Smokeless tobacco: Never Used  . Tobacco comment: smoked cigarettes age [age], up to 1ppd  Substance Use Topics  . Alcohol use: Yes    Alcohol/week: 2.0 standard drinks    Types: 1 Glasses of wine, 1 Cans of beer per week    Comment: less than 1 drink per week    Family History  Problem Relation Age of Onset  . Prostate cancer Father   . Hypertension Father   . Hypertension Mother   . Prostate cancer Brother   . Melanoma Sister   . Atrial fibrillation Brother   . Melanoma Brother   . Lupus Sister   . Diabetes Neg Hx   . Heart disease Neg Hx   . Stroke Neg Hx   . Colon cancer Neg Hx      Review of Systems  Constitutional: Negative for chills and fever.  HENT: Negative for congestion, sore throat and tinnitus.   Eyes: Negative for double vision, photophobia and pain.  Respiratory: Negative for cough, shortness of breath and wheezing.   Cardiovascular: Negative for chest pain, palpitations and orthopnea.  Gastrointestinal: Negative for heartburn, nausea and vomiting.  Genitourinary: Negative for dysuria, frequency and urgency.  Musculoskeletal: Positive for joint pain.  Neurological: Negative for dizziness, weakness and headaches.     Objective:  Physical Exam  Well nourished and well developed.  General: Alert and oriented x3, cooperative and pleasant, no acute distress.  Head: normocephalic, atraumatic, neck supple.  Eyes: EOMI.  Respiratory: breath sounds clear in all fields, no wheezing, rales, or rhonchi. Cardiovascular: Regular rate and rhythm, no murmurs, gallops or rubs.  Abdomen: non-tender to palpation and soft, normoactive bowel sounds. Musculoskeletal: Right Hip Exam: ROM: Flexion to 100, No Internal Rotation, External Rotation 20, and abduction 20 without discomfort. There is no tenderness over the greater trochanter bursa. Calves soft and nontender. Motor function intact in LE. Strength 5/5 LE bilaterally. Neuro: Distal pulses 2+. Sensation to light touch intact in LE.  Vital signs in last 24 hours: Blood pressure: 138/80 mmHg Pulse: 60 bpm  Labs:   Estimated body mass  index is 28.55 kg/m as calculated from the following:   Height as of 12/07/17: 5' 7.5" (1.715 m).   Weight as of 12/07/17: 83.9 kg.   Imaging Review Plain radiographs demonstrate severe degenerative joint disease of the right hip(s). The bone quality appears to be adequate for age and reported activity level.    Preoperative templating of the joint replacement has been completed, documented, and submitted to the Operating Room personnel in order to optimize intra-operative equipment management.     Assessment/Plan:  End stage arthritis, right hip(s)  The patient history, physical examination, clinical judgement of the provider and imaging studies are consistent with end stage degenerative joint disease of the right hip(s) and total hip arthroplasty is deemed medically necessary. The treatment options including medical management, injection therapy, arthroscopy and arthroplasty were discussed at length. The risks and benefits of total hip arthroplasty were presented and reviewed. The risks due to aseptic loosening,  infection, stiffness, dislocation/subluxation,  thromboembolic complications and other imponderables were discussed.  The patient acknowledged the explanation, agreed to proceed with the plan and consent was signed. Patient is being admitted for inpatient treatment for surgery, pain control, PT, OT, prophylactic antibiotics, VTE prophylaxis, progressive ambulation and ADL's and discharge planning.The patient is planning to be discharged home.   Therapy Plans: HEP Disposition: Home with wife Planned DVT Prophylaxis: Eliquis 5 mg BID DME needed: None PCP: Celso Amy, MD Cardiologist: Thompson Grayer, MD TXA: IV Allergies: NKDA Anesthesia Concerns: None BMI: 28.9  - Patient was instructed on what medications to stop prior to surgery. - Follow-up visit in 2 weeks with Dr. Wynelle Link - Begin physical therapy following surgery - Pre-operative lab work as pre-surgical testing - Prescriptions will be provided in hospital at time of discharge  Theresa Duty, PA-C Orthopedic Surgery [facility] [place]

## 2018-05-09 NOTE — Patient Instructions (Addendum)
  Tests ordered today. Your results will be released to MyChart (or called to you) after review, usually within 72hours after test completion. If any changes need to be made, you will be notified at that same time.  Medications reviewed and updated.  Changes include :   none     

## 2018-05-09 NOTE — Progress Notes (Signed)
Subjective:    Patient ID: Daniel Tucker, male    DOB: 1941-03-27, 78 y.o.   MRN: [id number]  HPI He is here for pre-operative clearance at the request of Dr Wynelle Link for right total hip replacement scheduled for 05/17/2018.   He denies any personal or family history of problems with anesthesia or bleeding/blood clot problems.  He has done well with surgery in the past.    He has no concerns.  He is taking all his medication as prescribed.   He is exercising regularly - walking.  With his daily activities he denies chest pain, palpitations, SOB and lightheadedness.       Medications and allergies reviewed with patient and updated if appropriate.  Patient Active Problem List   Diagnosis Date Noted  . Hives 10/02/2017  . Prediabetes 09/05/2015  . 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 Arthritis of right hip 04/02/2015  . Atrial fibrillation ([place]) 08/24/2014  . Obstructive sleep apnea 07/28/2012  . Nonspecific abnormal electrocardiogram (ECG) (EKG) 05/15/2011  . Hyperlipidemia 10/10/2008  . Essential hypertension 10/10/2008  . History of colonic polyps 10/10/2008  . 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 acetaminophen (TYLENOL) 500 MG tablet Take 500 mg by mouth every 6 (six) hours as needed for moderate pain or headache.     Marland Kitchen acyclovir (ZOVIRAX) 200 MG capsule TAKE 2 CAPSULES EVERY DAY AS DIRECTED (Patient taking differently: Take 400 mg by mouth daily. ) 180 capsule 3  . amLODipine (NORVASC) 5 MG tablet Take 1.5 tablets (7.5 mg total) by mouth daily. 135 tablet 3  . Ascorbic Acid (VITAMIN C) 1000 MG tablet Take 1,000 mg by mouth daily.      Marland Kitchen docusate sodium (COLACE) 100 MG capsule Take 100 mg daily by mouth.    . dofetilide (TIKOSYN) 500 MCG capsule TAKE 1 CAPSULE TWICE DAILY (Patient taking differently: Take 500 mcg by mouth 2 (two) times daily. ) 180 capsule 1    . ELIQUIS 5 MG TABS tablet TAKE 1 TABLET TWICE DAILY (Patient taking differently: Take 5 mg by mouth 2 (two) times daily. ) 180 tablet 3  . GLUCOSAMINE SULFATE PO Take 2 tablets by mouth daily.     . metoprolol tartrate (LOPRESSOR) 25 MG tablet TAKE 1/2 TABLET TWICE DAILY (Patient taking differently: Take 12.5 mg by mouth 2 (two) times daily. ) 90 tablet 3  . Multiple Vitamin (MULTIVITAMIN) tablet Take 1 tablet by mouth daily.     . Multiple Vitamins-Minerals (PRESERVISION AREDS 2 PO) Take 1 capsule by mouth 2 (two) times daily.     . potassium chloride SA (K-DUR,KLOR-CON) 20 MEQ tablet TAKE 1 TABLET FOUR TIMES DAILY (Patient taking differently: Take 20 mEq by mouth 4 (four) times daily. ) 360 tablet 3  . pravastatin (PRAVACHOL) 20 MG tablet TAKE 1 TABLET EVERY DAY (Patient taking differently: Take 20 mg by mouth daily. ) 90 tablet 2  . triamcinolone ointment (KENALOG) 0.5 % Apply 1 application topically 2 (two) times daily. (Patient taking differently: Apply 1 application topically 2 (two) times daily as needed (for itching). ) 30 g 0   No current facility-administered medications on file prior to visit.     Past Medical History:  Diagnosis Date  . Arthritis    "hips" (11/20/2014)  . Benign essential HTN   . Fasting hyperglycemia 2006   FBS 111; normal A1c  . Hepatitis ~  1955   "yellow juandice"  . Hyperlipidemia   . Internal hemorrhoids   . Persistent atrial fibrillation 08/24/2014   CHASD2VASC score is 3 - on Apixaban  . Sleep apnea    intolerant to CPAP and now using oral device followed by Dr. Toy Cookey  . Tubular adenoma of colon 2006   Dr Fuller Plan    Past Surgical History:  Procedure Laterality Date  . CARDIOVERSION N/A 10/12/2014   Procedure: CARDIOVERSION;  Surgeon: Darlin Coco, MD;  Location: [facility] ENDOSCOPY;  Service: Cardiovascular;  Laterality: N/A;  . COLONOSCOPY  2011   no polyp  . COLONOSCOPY W/ POLYPECTOMY  2006   [place]  . NASAL SINUS SURGERY  1997  . PROSTATE  BIOPSY  2010   Negative,Dr.Ottelin  . TONSILLECTOMY AND ADENOIDECTOMY  1997  . 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 UVULOPALATOPHARYNGOPLASTY  1997   Dr.Woliki    Social History   Socioeconomic History  . Marital status: Married    Spouse name: Not on file  . Number of children: 3  . Years of education: Not on file  . Highest education level: Not on file  Occupational History  . Occupation: retired   Scientific laboratory technician  . Financial resource strain: Not hard at all  . Food insecurity:    Worry: Never true    Inability: Never true  . Transportation needs:    Medical: No    Non-medical: No  Tobacco Use  . Smoking status: Former Smoker    Packs/day: 1.00    Years: 7.00    Pack years: 7.00    Types: Cigarettes  . Smokeless tobacco: Never Used  . Tobacco comment: smoked cigarettes age [age], up to 1ppd  Substance and Sexual Activity  . Alcohol use: Yes    Alcohol/week: 2.0 standard drinks    Types: 1 Glasses of wine, 1 Cans of beer per week    Comment: less than 1 drink per week  . Drug use: No  . Sexual activity: Yes  Lifestyle  . Physical activity:    Days per week: 4 days    Minutes per session: 50 min  . Stress: Not at all  Relationships  . Social connections:    Talks on phone: More than three times a week    Gets together: More than three times a week    Attends religious service: More than 4 times per year    Active member of club or organization: Yes    Attends meetings of clubs or organizations: More than 4 times per year    Relationship status: Married  Other Topics Concern  . Not on file  Social History Narrative  . Not on file    Family History  Problem Relation Age of Onset  . Prostate cancer Father   . Hypertension Father   . Hypertension Mother   . Prostate cancer Brother   . Melanoma Sister   . Atrial fibrillation Brother   . Melanoma Brother   . Lupus Sister   . Diabetes Neg Hx   . Heart disease Neg Hx   . Stroke Neg Hx   .  Colon cancer Neg Hx     Review of Systems  Constitutional: Negative for chills and fever.  Eyes: Negative for visual disturbance.  Respiratory: Negative for cough, shortness of breath and wheezing.   Cardiovascular: Negative for chest pain, palpitations and leg swelling.  Gastrointestinal: Negative for abdominal pain, blood in stool, constipation, diarrhea and nausea.  No gerd  Genitourinary: Negative for dysuria and hematuria.  Skin: Negative for rash.  Neurological: Negative for dizziness, light-headedness and headaches.       Objective:   Vitals:   05/11/18 0740  BP: (!) 144/80  Pulse: (!) 52  Resp: 16  Temp: 97.6 F (36.4 C)  SpO2: 99%   Filed Weights   05/11/18 0740  Weight: 190 lb 12.8 oz (86.5 kg)   Body mass index is 29.44 kg/m.  BP Readings from Last 3 Encounters:  05/11/18 (!) 144/80  12/07/17 120/78  11/02/17 114/70    Wt Readings from Last 3 Encounters:  05/11/18 190 lb 12.8 oz (86.5 kg)  12/07/17 185 lb (83.9 kg)  11/02/17 184 lb 6 oz (83.6 kg)     Physical Exam Constitutional: He appears well-developed and well-nourished. No distress.  HENT:  Head: Normocephalic and atraumatic.  Right Ear: External ear normal.  Left Ear: External ear normal.  Mouth/Throat: Oropharynx is clear and moist.  Normal ear canals and TM b/l  Eyes: Conjunctivae and EOM are normal.  Neck: Neck supple. No tracheal deviation present. No thyromegaly present. No carotid bruit  Cardiovascular: Normal rate, regular rhythm, normal heart sounds and intact distal pulses.  No murmur heard.  No edema.  Pulmonary/Chest: Effort normal and breath sounds normal. No respiratory distress. He has no wheezes. He has no rales.  Abdominal: Soft. He exhibits no distension. There is no tenderness.  Lymphadenopathy:   He has no cervical adenopathy.  Skin: Skin is warm and dry. He is not diaphoretic.  Psychiatric: He has a normal mood and affect. His behavior is normal.           Assessment & Plan:      See Problem List for Assessment and Plan of chronic medical problems.

## 2018-05-11 ENCOUNTER — Encounter: Payer: Self-pay | Admitting: Internal Medicine

## 2018-05-11 ENCOUNTER — Other Ambulatory Visit (INDEPENDENT_AMBULATORY_CARE_PROVIDER_SITE_OTHER): Payer: Medicare Other

## 2018-05-11 ENCOUNTER — Ambulatory Visit (INDEPENDENT_AMBULATORY_CARE_PROVIDER_SITE_OTHER): Payer: Medicare Other | Admitting: Internal Medicine

## 2018-05-11 VITALS — BP 144/80 | HR 52 | Temp 97.6°F | Resp 16 | Ht 67.5 in | Wt 190.8 lb

## 2018-05-11 DIAGNOSIS — I4891 Unspecified atrial fibrillation: Secondary | ICD-10-CM

## 2018-05-11 DIAGNOSIS — R7303 Prediabetes: Secondary | ICD-10-CM

## 2018-05-11 DIAGNOSIS — E782 Mixed hyperlipidemia: Secondary | ICD-10-CM | POA: Diagnosis not present

## 2018-05-11 DIAGNOSIS — I10 Essential (primary) hypertension: Secondary | ICD-10-CM | POA: Diagnosis not present

## 2018-05-11 DIAGNOSIS — M1611 Unilateral primary osteoarthritis, right hip: Secondary | ICD-10-CM

## 2018-05-11 DIAGNOSIS — Z01818 Encounter for other preprocedural examination: Secondary | ICD-10-CM | POA: Diagnosis not present

## 2018-05-11 LAB — COMPREHENSIVE METABOLIC PANEL
ALT: 12 U/L (ref 0–53)
AST: 15 U/L (ref 0–37)
Albumin: 4.5 g/dL (ref 3.5–5.2)
Alkaline Phosphatase: 71 U/L (ref 39–117)
BUN: 18 mg/dL (ref 6–23)
CALCIUM: 9.8 mg/dL (ref 8.4–10.5)
CO2: 29 mEq/L (ref 19–32)
Chloride: 102 mEq/L (ref 96–112)
Creatinine, Ser: 1.09 mg/dL (ref 0.40–1.50)
GFR: 65.61 mL/min (ref >60.00)
Glucose, Bld: 103 mg/dL — ABNORMAL HIGH (ref 70–99)
POTASSIUM: 4.1 meq/L (ref 3.5–5.1)
Sodium: 139 mEq/L (ref 135–145)
Total Bilirubin: 0.8 mg/dL (ref 0.2–1.2)
Total Protein: 7.6 g/dL (ref 6.0–8.3)

## 2018-05-11 LAB — LIPID PANEL
Cholesterol: 169 mg/dL (ref 0–200)
HDL: 49.1 mg/dL (ref >39.00)
LDL Cholesterol: 92 mg/dL (ref 0–99)
NonHDL: 119.5
Total CHOL/HDL Ratio: 3
Triglycerides: 137 mg/dL (ref 0.0–149.0)
VLDL: 27.4 mg/dL (ref 0.0–40.0)

## 2018-05-11 LAB — CBC WITH DIFFERENTIAL/PLATELET
Basophils Absolute: 0 10*3/uL (ref 0.0–0.1)
Basophils Relative: 0 % (ref 0.0–3.0)
Eosinophils Absolute: 0.2 10*3/uL (ref 0.0–0.7)
Eosinophils Relative: 2.3 % (ref 0.0–5.0)
HCT: 44.6 % (ref 39.0–52.0)
Hemoglobin: 15.2 g/dL (ref 13.0–17.0)
LYMPHS PCT: 21.6 % (ref 12.0–46.0)
Lymphs Abs: 1.7 10*3/uL (ref 0.7–4.0)
MCHC: 34.1 g/dL (ref 30.0–36.0)
MCV: 95.8 fl (ref 78.0–100.0)
Monocytes Absolute: 0.9 10*3/uL (ref 0.1–1.0)
Monocytes Relative: 10.6 % (ref 3.0–12.0)
NEUTROS PCT: 65.5 % (ref 43.0–77.0)
Neutro Abs: 5.3 10*3/uL (ref 1.4–7.7)
Platelets: 232 10*3/uL (ref 150.0–400.0)
RBC: 4.65 Mil/uL (ref 4.22–5.81)
RDW: 12.8 % (ref 11.5–15.5)
WBC: 8.1 10*3/uL (ref 4.0–10.5)

## 2018-05-11 LAB — HEMOGLOBIN A1C: Hgb A1c MFr Bld: 5.8 % (ref 4.6–6.5)

## 2018-05-11 LAB — TSH: TSH: 2.16 u[IU]/mL (ref 0.35–4.50)

## 2018-05-11 NOTE — Assessment & Plan Note (Signed)
End stage - all conservative treatments have been tried and have not been successful Will have surgery next week - total hip replacement

## 2018-05-11 NOTE — Assessment & Plan Note (Signed)
Asymptomatic Rate controlled on metoprolol On eliquis - will stop 3 days prior to surgery Cbc, cmp, tsh

## 2018-05-11 NOTE — Assessment & Plan Note (Signed)
BP Readings from Last 3 Encounters:  05/11/18 (!) 144/80  12/07/17 120/78  11/02/17 114/70    BP well controlled Current regimen effective and well tolerated Continue current medications at current doses Cmp,tsh, cbc

## 2018-05-11 NOTE — Patient Instructions (Addendum)
ELIU BATCH  05/11/2018   Your procedure is scheduled on: 05-17-2018    Report to [facility] Main  Entrance    Report to admitting at 12:15PM  [place]      Call this number if you have problems the morning of surgery [phone]      Remember: [place] MIDNIGHT UNTIL 8:45AM. NOTHING BY MOUTH AFTER 8:45AM! BRUSH YOUR TEETH MORNING OF SURGERY AND RINSE YOUR MOUTH OUT, NO CHEWING GUM CANDY OR MINTS.      CLEAR LIQUID DIET   Foods Allowed                                                                     Foods Excluded  Coffee and tea, regular and decaf                             liquids that you cannot  Plain Jell-O in any flavor                                             see through such as: Fruit ices (not with fruit pulp)                                     milk, soups, orange juice  Iced Popsicles                                    All solid food Carbonated beverages, regular and diet                                    Cranberry, grape and apple juices Sports drinks like Gatorade Lightly seasoned clear broth or consume(fat free) Sugar, honey syrup  Sample Menu Breakfast                                Lunch                                     Supper Cranberry juice                    Beef broth                            Chicken broth Jell-O                                     Grape juice  Apple juice Coffee or tea                        Jell-O                                      Popsicle                                                Coffee or tea                        Coffee or tea  _____________________________________________________________________       Take these medicines the morning of surgery with A SIP OF WATER: TIKOSYN, METOPROLOL, PRAVASTATIN, AMLODIPINE, ACYCLOVIR, TYLENOL IF NEEDED                                You may not have any  metal on your body including hair pins and              piercings  Do not wear jewelry, make-up, lotions, powders or perfumes, deodorant              Men may shave face and neck.   Do not bring valuables to the hospital. [place].  Contacts, dentures or bridgework may not be worn into surgery.  Leave suitcase in the car. After surgery it may be brought to your room.                   Please read over the following fact sheets you were given: _____________________________________________________________________             [facility] - Preparing for Surgery Before surgery, you can play an important role.  Because skin is not sterile, your skin needs to be as free of germs as possible.  You can reduce the number of germs on your skin by washing with CHG (chlorahexidine gluconate) soap before surgery.  CHG is an antiseptic cleaner which kills germs and bonds with the skin to continue killing germs even after washing. Please DO NOT use if you have an allergy to CHG or antibacterial soaps.  If your skin becomes reddened/irritated stop using the CHG and inform your nurse when you arrive at Short Stay. Do not shave (including legs and underarms) for at least 48 hours prior to the first CHG shower.  You may shave your face/neck. Please follow these instructions carefully:  1.  Shower with CHG Soap the night before surgery and the  morning of Surgery.  2.  If you choose to wash your hair, wash your hair first as usual with your  normal  shampoo.  3.  After you shampoo, rinse your hair and body thoroughly to remove the  shampoo.                           4.  Use CHG as you would any other liquid soap.  You can apply chg directly  to the skin and wash  Gently with a scrungie or clean washcloth.  5.  Apply the CHG Soap to your body ONLY FROM THE NECK DOWN.   Do not use on face/ open                           Wound or open sores.  Avoid contact with eyes, ears mouth and genitals (private parts).                       Wash face,  Genitals (private parts) with your normal soap.             6.  Wash thoroughly, paying special attention to the area where your surgery  will be performed.  7.  Thoroughly rinse your body with warm water from the neck down.  8.  DO NOT shower/wash with your normal soap after using and rinsing off  the CHG Soap.                9.  Pat yourself dry with a clean towel.            10.  Wear clean pajamas.            11.  Place clean sheets on your bed the night of your first shower and do not  sleep with pets. Day of Surgery : Do not apply any lotions/deodorants the morning of surgery.  Please wear clean clothes to the hospital/surgery center.  FAILURE TO FOLLOW THESE INSTRUCTIONS MAY RESULT IN THE CANCELLATION OF YOUR SURGERY PATIENT SIGNATURE_________________________________  NURSE SIGNATURE__________________________________  ________________________________________________________________________   Adam Phenix  An incentive spirometer is a tool that can help keep your lungs clear and active. This tool measures how well you are filling your lungs with each breath. Taking long deep breaths may help reverse or decrease the chance of developing breathing (pulmonary) problems (especially infection) following:  A long period of time when you are unable to move or be active. BEFORE THE PROCEDURE   If the spirometer includes an indicator to show your best effort, your nurse or respiratory therapist will set it to a desired goal.  If possible, sit up straight or lean slightly forward. Try not to slouch.  Hold the incentive spirometer in an upright position. INSTRUCTIONS FOR USE  1. Sit on the edge of your bed if possible, or sit up as far as you can in bed or on a chair. 2. Hold the incentive spirometer in an upright position. 3. Breathe out normally. 4. Place the mouthpiece in your  mouth and seal your lips tightly around it. 5. Breathe in slowly and as deeply as possible, raising the piston or the ball toward the top of the column. 6. Hold your breath for 3-5 seconds or for as long as possible. Allow the piston or ball to fall to the bottom of the column. 7. Remove the mouthpiece from your mouth and breathe out normally. 8. Rest for a few seconds and repeat Steps 1 through 7 at least 10 times every 1-2 hours when you are awake. Take your time and take a few normal breaths between deep breaths. 9. The spirometer may include an indicator to show your best effort. Use the indicator as a goal to work toward during each repetition. 10. After each set of 10 deep breaths, practice coughing to be sure your lungs are clear. If you have an incision (the cut made at the time of  surgery), support your incision when coughing by placing a pillow or rolled up towels firmly against it. Once you are able to get out of bed, walk around indoors and cough well. You may stop using the incentive spirometer when instructed by your caregiver.  RISKS AND COMPLICATIONS  Take your time so you do not get dizzy or light-headed.  If you are in pain, you may need to take or ask for pain medication before doing incentive spirometry. It is harder to take a deep breath if you are having pain. AFTER USE  Rest and breathe slowly and easily.  It can be helpful to keep track of a log of your progress. Your caregiver can provide you with a simple table to help with this. If you are using the spirometer at home, follow these instructions: [place] IF:   You are having difficultly using the spirometer.  You have trouble using the spirometer as often as instructed.  Your pain medication is not giving enough relief while using the spirometer.  You develop fever of 100.5 F (38.1 C) or higher. SEEK IMMEDIATE MEDICAL CARE IF:   You cough up bloody sputum that had not been present before.  You  develop fever of 102 F (38.9 C) or greater.  You develop worsening pain at or near the incision site. MAKE SURE YOU:   Understand these instructions.  Will watch your condition.  Will get help right away if you are not doing well or get worse. Document Released: 08/18/2006 Document Revised: 06/30/2011 Document Reviewed: 10/19/2006 Exit[employer] Patient Information 2014 Exit[employer], [place].   ________________________________________________________________________  WHAT IS A BLOOD TRANSFUSION? Blood Transfusion Information  A transfusion is the replacement of blood or some of its parts. Blood is made up of multiple cells which provide different functions.  Red blood cells carry oxygen and are used for blood loss replacement.  White blood cells fight against infection.  Platelets control bleeding.  Plasma helps clot blood.  Other blood products are available for specialized needs, such as hemophilia or other clotting disorders. BEFORE THE TRANSFUSION  Who gives blood for transfusions?   Healthy volunteers who are fully evaluated to make sure their blood is safe. This is blood bank blood. Transfusion therapy is the safest it has ever been in the practice of medicine. Before blood is taken from a donor, a complete history is taken to make sure that person has no history of diseases nor engages in risky social behavior (examples are intravenous drug use or sexual activity with multiple partners). The donor's travel history is screened to minimize risk of transmitting infections, such as malaria. The donated blood is tested for signs of infectious diseases, such as HIV and hepatitis. The blood is then tested to be sure it is compatible with you in order to minimize the chance of a transfusion reaction. If you or a relative donates blood, this is often done in anticipation of surgery and is not appropriate for emergency situations. It takes many days to process the donated blood. RISKS AND  COMPLICATIONS Although transfusion therapy is very safe and saves many lives, the main dangers of transfusion include:   Getting an infectious disease.  Developing a transfusion reaction. This is an allergic reaction to something in the blood you were given. Every precaution is taken to prevent this. The decision to have a blood transfusion has been considered carefully by your caregiver before blood is given. Blood is not given unless the benefits outweigh the risks. AFTER THE  TRANSFUSION  Right after receiving a blood transfusion, you will usually feel much better and more energetic. This is especially true if your red blood cells have gotten low (anemic). The transfusion raises the level of the red blood cells which carry oxygen, and this usually causes an energy increase.  The nurse administering the transfusion will monitor you carefully for complications. HOME CARE INSTRUCTIONS  No special instructions are needed after a transfusion. You may find your energy is better. Speak with your caregiver about any limitations on activity for underlying diseases you may have. SEEK MEDICAL CARE IF:   Your condition is not improving after your transfusion.  You develop redness or irritation at the intravenous (IV) site. SEEK IMMEDIATE MEDICAL CARE IF:  Any of the following symptoms occur over the next 12 hours:  Shaking chills.  You have a temperature by mouth above 102 F (38.9 C), not controlled by medicine.  Chest, back, or muscle pain.  People around you feel you are not acting correctly or are confused.  Shortness of breath or difficulty breathing.  Dizziness and fainting.  You get a rash or develop hives.  You have a decrease in urine output.  Your urine turns a dark color or changes to pink, red, or brown. Any of the following symptoms occur over the next 10 days:  You have a temperature by mouth above 102 F (38.9 C), not controlled by medicine.  Shortness of  breath.  Weakness after normal activity.  The white part of the eye turns yellow (jaundice).  You have a decrease in the amount of urine or are urinating less often.  Your urine turns a dark color or changes to pink, red, or brown. Document Released: 04/04/2000 Document Revised: 06/30/2011 Document Reviewed: 11/22/2007 [facility] Patient Information 2014 [place], [place].  _______________________________________________________________________

## 2018-05-11 NOTE — Assessment & Plan Note (Signed)
Check a1c Low sugar / carb diet Stressed regular exercise   

## 2018-05-11 NOTE — Progress Notes (Signed)
[name] 05-11-2018 Epic   EKG 12-07-17 Epic

## 2018-05-11 NOTE — Assessment & Plan Note (Addendum)
For R THR 05/17/18 - conservative treatment has failed Low risk for surgery No CAD, COPD or symptoms concerning for either at rest or with exercise Will check basic labs since he is due Has been cleared by cardiology Will stop eliquis 3 days prior to surgery and Vitamins 5 days prior to surgery He has no concerns Cleared for surgery - will send form to Dr Wynelle Link

## 2018-05-11 NOTE — Assessment & Plan Note (Signed)
Check lipid panel  Continue daily statin Regular exercise and healthy diet encouraged  

## 2018-05-12 ENCOUNTER — Encounter (HOSPITAL_COMMUNITY): Payer: Self-pay

## 2018-05-12 ENCOUNTER — Encounter (HOSPITAL_COMMUNITY)
Admission: RE | Admit: 2018-05-12 | Discharge: 2018-05-12 | Disposition: A | Discharge status: Home / Self Care | Payer: Medicare Other | Source: Ambulatory Visit | Attending: Orthopedic Surgery | Admitting: Orthopedic Surgery

## 2018-05-12 ENCOUNTER — Other Ambulatory Visit: Payer: Self-pay

## 2018-05-12 DIAGNOSIS — M1611 Unilateral primary osteoarthritis, right hip: Secondary | ICD-10-CM | POA: Diagnosis not present

## 2018-05-12 DIAGNOSIS — Z01812 Encounter for preprocedural laboratory examination: Secondary | ICD-10-CM | POA: Insufficient documentation

## 2018-05-12 LAB — SURGICAL PCR SCREEN
MRSA, PCR: NEGATIVE
Staphylococcus aureus: POSITIVE — AB

## 2018-05-12 LAB — PROTIME-INR
INR: 1.13
Prothrombin Time: 14.4 seconds (ref 11.4–15.2)

## 2018-05-12 LAB — APTT: aPTT: 35 seconds (ref 24–36)

## 2018-05-13 NOTE — Anesthesia Preprocedure Evaluation (Addendum)
Anesthesia Evaluation  Patient identified by MRN, date of birth, ID band Patient awake    Reviewed: Allergy & Precautions, NPO status , Patient's Chart, lab work & pertinent test results  Airway Mallampati: I  TM Distance: >3 FB Neck ROM: Full    Dental   Pulmonary sleep apnea , former smoker,    Pulmonary exam normal        Cardiovascular hypertension, Pt. on medications Normal cardiovascular exam+ dysrhythmias Atrial Fibrillation      Neuro/Psych    GI/Hepatic   Endo/Other    Renal/GU      Musculoskeletal   Abdominal   Peds  Hematology   Anesthesia Other Findings   Reproductive/Obstetrics                            Anesthesia Physical Anesthesia Plan  ASA: III  Anesthesia Plan: Spinal   Post-op Pain Management:    Induction: Intravenous  PONV Risk Score and Plan: Ondansetron  Airway Management Planned: Simple Face Mask  Additional Equipment:   Intra-op Plan:   Post-operative Plan:   Informed Consent: I have reviewed the patients History and Physical, chart, labs and discussed the procedure including the risks, benefits and alternatives for the proposed anesthesia with the patient or authorized representative who has indicated his/her understanding and acceptance.       Plan Discussed with: CRNA and Surgeon  Anesthesia Plan Comments: (See PST note 05/12/18, Konrad Felix, PA-C)       Anesthesia Quick Evaluation

## 2018-05-13 NOTE — Progress Notes (Signed)
Anesthesia Chart Review   Case:  [id number] Date/Time:  05/17/18 1435   Procedure:  TOTAL HIP ARTHROPLASTY ANTERIOR APPROACH (Right )   Anesthesia type:  Choice   Pre-op diagnosis:  right hip osteoarthritis   Location:  [facility] ROOM 10 / [facility]   Surgeon:  Gaynelle Arabian, MD      DISCUSSION:78 yo former smoker (7 pack years) with h/o HLD, sleep apnea (intolerant to CPAP, using oral device), HTN, A-fib (anticoagulated with Eliquis), right hip osteoarthritis scheduled for above surgery on 05/17/18 with Dr. Gaynelle Arabian.  Pt last seen by electrophysiology on 12/07/17, seen by Roderic Palau, NP.  H/o Afib maintaining SR on tikosyn anticoagulated with Eliquis, stable at this visit. Per pharmacy note 03/30/18, Daniel Tucker, [facility], pt can hold Eliquis 3 days prior to surgery.   Pt was recently seen by PCP, Dr. Billey Gosling, on 05/11/2018 who reports pt is stable, asymptomatic.  She reports, "Low risk for surgery. No CAD, COPD or symptoms concerning for either at rest or with exercise. Will check basic labs since he is due. Has been cleared by cardiology.  Will stop eliquis 3 days prior to surgery and Vitamins 5 days prior to surgery."   Pt can proceed with planned procedure barring acute status change.  VS: BP (!) 154/94   Pulse (!) 51   Temp 36.6 C (Oral)   SpO2 100%   PROVIDERS: Binnie Rail, MD is PCP  Fransico Him, MD is Cardiologist  LABS: Labs reviewed: Acceptable for surgery. (all labs ordered are listed, but only abnormal results are displayed)  Labs Reviewed  SURGICAL PCR SCREEN - Abnormal; Notable for the following components:      Result Value   Staphylococcus aureus POSITIVE (*)    All other components within normal limits  APTT  PROTIME-INR  TYPE AND SCREEN     IMAGES:   EKG: 12/07/17  Rate 61 bpm Normal sinus rhythm Left axis deviation  Right bundle branch block Moderate voltage criteria for LVH, may be normal variant T wave abnormality, consider lateral  ischemia Abnormal ECG No significant change since last tracing  CV: Echo 09/11/14 Study Conclusions  - Left ventricle: The cavity size was mildly dilated. Wall   thickness was normal. Systolic function was normal. The estimated   ejection fraction was in the range of 55% to 60%. Wall motion was   normal; there were no regional wall motion abnormalities. - Mitral valve: There was mild regurgitation. - Left atrium: The atrium was moderately to severely dilated. - Right atrium: The atrium was moderately dilated. - Pulmonary arteries: Systolic pressure was mildly increased. PA   peak pressure: 42 mm Hg (S). Past Medical History:  Diagnosis Date  . Arthritis    "hips" (11/20/2014)  . Benign essential HTN   . Fasting hyperglycemia 2006   FBS 111; normal A1c  . Hepatitis ~ 1955   "yellow juandice"  . Hyperlipidemia   . Internal hemorrhoids   . Persistent atrial fibrillation 08/24/2014   CHASD2VASC score is 3 - on Apixaban  . Sleep apnea    intolerant to CPAP and now using oral device followed by Dr. Toy Cookey  . Tubular adenoma of colon 2006   Dr Fuller Tucker    Past Surgical History:  Procedure Laterality Date  . CARDIOVERSION N/A 10/12/2014   Procedure: CARDIOVERSION;  Surgeon: Darlin Coco, MD;  Location: [facility] ENDOSCOPY;  Service: Cardiovascular;  Laterality: N/A;  . COLONOSCOPY  2011   no polyp  . COLONOSCOPY W/ POLYPECTOMY  2006   Mar-Mac  . NASAL SINUS SURGERY  1997  . PROSTATE BIOPSY  2010   Negative,Dr.Ottelin  . TONSILLECTOMY AND ADENOIDECTOMY  1997  . 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 UVULOPALATOPHARYNGOPLASTY  1997   Dr.Woliki    MEDICATIONS: . acetaminophen (TYLENOL) 500 MG tablet  . acyclovir (ZOVIRAX) 200 MG capsule  . amLODipine (NORVASC) 5 MG tablet  . Ascorbic Acid (VITAMIN C) 1000 MG tablet  . docusate sodium (COLACE) 100 MG capsule  . dofetilide (TIKOSYN) 500 MCG capsule  . ELIQUIS 5 MG TABS tablet  . GLUCOSAMINE SULFATE PO  .  metoprolol tartrate (LOPRESSOR) 25 MG tablet  . Multiple Vitamin (MULTIVITAMIN) tablet  . Multiple Vitamins-Minerals (PRESERVISION AREDS 2 PO)  . potassium chloride SA (K-DUR,KLOR-CON) 20 MEQ tablet  . pravastatin (PRAVACHOL) 20 MG tablet  . triamcinolone ointment (KENALOG) 0.5 %   No current facility-administered medications for this encounter.     Daniel Tucker [facility] Pre-Surgical Testing [phone] 05/13/18 1:54 PM

## 2018-05-17 ENCOUNTER — Observation Stay (HOSPITAL_COMMUNITY)
Admission: RE | Admit: 2018-05-17 | Discharge: 2018-05-18 | Disposition: A | Discharge status: Home / Self Care | Payer: Medicare Other | Attending: Orthopedic Surgery | Admitting: Orthopedic Surgery

## 2018-05-17 ENCOUNTER — Encounter (HOSPITAL_COMMUNITY)
Admission: RE | Disposition: A | Discharge status: Home / Self Care | Payer: Self-pay | Source: Home / Self Care | Attending: Orthopedic Surgery

## 2018-05-17 ENCOUNTER — Other Ambulatory Visit: Payer: Self-pay

## 2018-05-17 ENCOUNTER — Inpatient Hospital Stay (HOSPITAL_COMMUNITY): Payer: Medicare Other

## 2018-05-17 ENCOUNTER — Inpatient Hospital Stay (HOSPITAL_COMMUNITY): Payer: Medicare Other | Admitting: Physician Assistant

## 2018-05-17 ENCOUNTER — Inpatient Hospital Stay (HOSPITAL_COMMUNITY): Payer: Medicare Other | Admitting: Certified Registered"

## 2018-05-17 ENCOUNTER — Encounter (HOSPITAL_COMMUNITY): Payer: Self-pay | Admitting: *Deleted

## 2018-05-17 DIAGNOSIS — M25751 Osteophyte, right hip: Secondary | ICD-10-CM | POA: Diagnosis not present

## 2018-05-17 DIAGNOSIS — I4819 Other persistent atrial fibrillation: Secondary | ICD-10-CM | POA: Insufficient documentation

## 2018-05-17 DIAGNOSIS — Z87891 Personal history of nicotine dependence: Secondary | ICD-10-CM | POA: Diagnosis not present

## 2018-05-17 DIAGNOSIS — M25551 Pain in right hip: Secondary | ICD-10-CM | POA: Diagnosis present

## 2018-05-17 DIAGNOSIS — E785 Hyperlipidemia, unspecified: Secondary | ICD-10-CM | POA: Diagnosis not present

## 2018-05-17 DIAGNOSIS — I4891 Unspecified atrial fibrillation: Secondary | ICD-10-CM | POA: Diagnosis not present

## 2018-05-17 DIAGNOSIS — Z79899 Other long term (current) drug therapy: Secondary | ICD-10-CM | POA: Diagnosis not present

## 2018-05-17 DIAGNOSIS — Z96642 Presence of left artificial hip joint: Secondary | ICD-10-CM | POA: Insufficient documentation

## 2018-05-17 DIAGNOSIS — G4730 Sleep apnea, unspecified: Secondary | ICD-10-CM | POA: Diagnosis not present

## 2018-05-17 DIAGNOSIS — M169 Osteoarthritis of hip, unspecified: Secondary | ICD-10-CM | POA: Diagnosis present

## 2018-05-17 DIAGNOSIS — Z7901 Long term (current) use of anticoagulants: Secondary | ICD-10-CM | POA: Diagnosis not present

## 2018-05-17 DIAGNOSIS — M1611 Unilateral primary osteoarthritis, right hip: Secondary | ICD-10-CM | POA: Diagnosis not present

## 2018-05-17 DIAGNOSIS — G4733 Obstructive sleep apnea (adult) (pediatric): Secondary | ICD-10-CM | POA: Diagnosis not present

## 2018-05-17 DIAGNOSIS — Z96641 Presence of right artificial hip joint: Secondary | ICD-10-CM | POA: Diagnosis not present

## 2018-05-17 DIAGNOSIS — Z96649 Presence of unspecified artificial hip joint: Secondary | ICD-10-CM

## 2018-05-17 DIAGNOSIS — Z419 Encounter for procedure for purposes other than remedying health state, unspecified: Secondary | ICD-10-CM

## 2018-05-17 DIAGNOSIS — Z471 Aftercare following joint replacement surgery: Secondary | ICD-10-CM | POA: Diagnosis not present

## 2018-05-17 DIAGNOSIS — I10 Essential (primary) hypertension: Secondary | ICD-10-CM | POA: Diagnosis not present

## 2018-05-17 HISTORY — PX: TOTAL HIP ARTHROPLASTY: SHX124

## 2018-05-17 LAB — TYPE AND SCREEN
ABO/RH(D): A POS
Antibody Screen: NEGATIVE

## 2018-05-17 SURGERY — ARTHROPLASTY, HIP, TOTAL, ANTERIOR APPROACH
Anesthesia: Spinal | Site: Hip | Laterality: Right

## 2018-05-17 MED ORDER — AMLODIPINE BESYLATE 5 MG PO TABS
7.5000 mg | ORAL_TABLET | Freq: Every day | ORAL | Status: DC
  Administered 2018-05-18: 7.5 mg via ORAL
  Filled 2018-05-17 (×2): qty 2

## 2018-05-17 MED ORDER — DEXAMETHASONE SODIUM PHOSPHATE 10 MG/ML IJ SOLN
INTRAMUSCULAR | Status: AC
  Filled 2018-05-17: qty 1

## 2018-05-17 MED ORDER — BUPIVACAINE-EPINEPHRINE (PF) 0.25% -1:200000 IJ SOLN
INTRAMUSCULAR | Status: DC | PRN
  Administered 2018-05-17: 30 mL

## 2018-05-17 MED ORDER — DOCUSATE SODIUM 100 MG PO CAPS
100.0000 mg | ORAL_CAPSULE | Freq: Two times a day (BID) | ORAL | Status: DC
  Administered 2018-05-17 – 2018-05-18 (×2): 100 mg via ORAL
  Filled 2018-05-17 (×2): qty 1

## 2018-05-17 MED ORDER — POTASSIUM CHLORIDE CRYS ER 20 MEQ PO TBCR
20.0000 meq | EXTENDED_RELEASE_TABLET | Freq: Four times a day (QID) | ORAL | Status: DC
  Administered 2018-05-17 – 2018-05-18 (×3): 20 meq via ORAL
  Filled 2018-05-17 (×3): qty 1

## 2018-05-17 MED ORDER — METOCLOPRAMIDE HCL 5 MG/ML IJ SOLN: 5.0000 mg | Freq: Three times a day (TID) | INTRAMUSCULAR | Status: DC | PRN

## 2018-05-17 MED ORDER — STERILE WATER FOR IRRIGATION IR SOLN
Status: DC | PRN
  Administered 2018-05-17: 2000 mL

## 2018-05-17 MED ORDER — TRAMADOL HCL 50 MG PO TABS
50.0000 mg | ORAL_TABLET | Freq: Four times a day (QID) | ORAL | Status: DC | PRN
  Administered 2018-05-17: 50 mg via ORAL
  Filled 2018-05-17: qty 1

## 2018-05-17 MED ORDER — ACYCLOVIR 200 MG PO CAPS
400.0000 mg | ORAL_CAPSULE | Freq: Every day | ORAL | Status: DC
  Administered 2018-05-18: 400 mg via ORAL
  Filled 2018-05-17: qty 2

## 2018-05-17 MED ORDER — CEFAZOLIN SODIUM-DEXTROSE 2-4 GM/100ML-% IV SOLN
2.0000 g | INTRAVENOUS | Status: AC
  Administered 2018-05-17: 2 g via INTRAVENOUS
  Filled 2018-05-17: qty 100

## 2018-05-17 MED ORDER — CEFAZOLIN SODIUM-DEXTROSE 2-4 GM/100ML-% IV SOLN
2.0000 g | Freq: Four times a day (QID) | INTRAVENOUS | Status: AC
  Administered 2018-05-17 – 2018-05-18 (×2): 2 g via INTRAVENOUS
  Filled 2018-05-17 (×2): qty 100

## 2018-05-17 MED ORDER — ONDANSETRON HCL 4 MG/2ML IJ SOLN
INTRAMUSCULAR | Status: DC | PRN
  Administered 2018-05-17: 4 mg via INTRAVENOUS

## 2018-05-17 MED ORDER — PROPOFOL 10 MG/ML IV BOLUS
INTRAVENOUS | Status: AC
  Filled 2018-05-17: qty 40

## 2018-05-17 MED ORDER — FENTANYL CITRATE (PF) 250 MCG/5ML IJ SOLN
INTRAMUSCULAR | Status: DC | PRN
  Administered 2018-05-17: 50 ug via INTRAVENOUS

## 2018-05-17 MED ORDER — DEXAMETHASONE SODIUM PHOSPHATE 10 MG/ML IJ SOLN
8.0000 mg | Freq: Once | INTRAMUSCULAR | Status: AC
  Administered 2018-05-17: 8 mg via INTRAVENOUS

## 2018-05-17 MED ORDER — POLYETHYLENE GLYCOL 3350 17 G PO PACK: 17.0000 g | PACK | Freq: Every day | ORAL | Status: DC | PRN

## 2018-05-17 MED ORDER — HYDROCODONE-ACETAMINOPHEN 5-325 MG PO TABS
1.0000 | ORAL_TABLET | ORAL | Status: DC | PRN
  Administered 2018-05-17 – 2018-05-18 (×3): 1 via ORAL
  Filled 2018-05-17 (×3): qty 1

## 2018-05-17 MED ORDER — MEPERIDINE HCL 50 MG/ML IJ SOLN: 6.2500 mg | INTRAMUSCULAR | Status: DC | PRN

## 2018-05-17 MED ORDER — PRAVASTATIN SODIUM 20 MG PO TABS: 20.0000 mg | ORAL_TABLET | Freq: Every day | ORAL | Status: DC

## 2018-05-17 MED ORDER — HYDROCODONE-ACETAMINOPHEN 5-325 MG PO TABS: 1.0000 | ORAL_TABLET | ORAL | Status: DC | PRN

## 2018-05-17 MED ORDER — SODIUM CHLORIDE 0.9 % IV SOLN
INTRAVENOUS | Status: DC
  Administered 2018-05-17: 18:00:00 via INTRAVENOUS

## 2018-05-17 MED ORDER — PHENOL 1.4 % MT LIQD: 1.0000 | OROMUCOSAL | Status: DC | PRN

## 2018-05-17 MED ORDER — PROPOFOL 10 MG/ML IV BOLUS
INTRAVENOUS | Status: DC | PRN
  Administered 2018-05-17: 20 mg via INTRAVENOUS

## 2018-05-17 MED ORDER — BUPIVACAINE-EPINEPHRINE (PF) 0.25% -1:200000 IJ SOLN
INTRAMUSCULAR | Status: AC
  Filled 2018-05-17: qty 30

## 2018-05-17 MED ORDER — FENTANYL CITRATE (PF) 100 MCG/2ML IJ SOLN
INTRAMUSCULAR | Status: AC
  Filled 2018-05-17: qty 2

## 2018-05-17 MED ORDER — ONDANSETRON HCL 4 MG/2ML IJ SOLN
INTRAMUSCULAR | Status: AC
  Filled 2018-05-17: qty 2

## 2018-05-17 MED ORDER — MIDAZOLAM HCL 2 MG/2ML IJ SOLN
INTRAMUSCULAR | Status: AC
  Filled 2018-05-17: qty 2

## 2018-05-17 MED ORDER — ONDANSETRON HCL 4 MG PO TABS: 4.0000 mg | ORAL_TABLET | Freq: Four times a day (QID) | ORAL | Status: DC | PRN

## 2018-05-17 MED ORDER — MORPHINE SULFATE (PF) 2 MG/ML IV SOLN: 0.5000 mg | INTRAVENOUS | Status: DC | PRN

## 2018-05-17 MED ORDER — METOCLOPRAMIDE HCL 5 MG PO TABS: 5.0000 mg | ORAL_TABLET | Freq: Three times a day (TID) | ORAL | Status: DC | PRN

## 2018-05-17 MED ORDER — BISACODYL 10 MG RE SUPP: 10.0000 mg | Freq: Every day | RECTAL | Status: DC | PRN

## 2018-05-17 MED ORDER — APIXABAN 2.5 MG PO TABS
2.5000 mg | ORAL_TABLET | Freq: Two times a day (BID) | ORAL | Status: DC
  Administered 2018-05-18: 2.5 mg via ORAL
  Filled 2018-05-17: qty 1

## 2018-05-17 MED ORDER — TRANEXAMIC ACID-NACL 1000-0.7 MG/100ML-% IV SOLN
1000.0000 mg | INTRAVENOUS | Status: AC
  Administered 2018-05-17: 1000 mg via INTRAVENOUS
  Filled 2018-05-17: qty 100

## 2018-05-17 MED ORDER — FLEET ENEMA 7-19 GM/118ML RE ENEM: 1.0000 | ENEMA | Freq: Once | RECTAL | Status: DC | PRN

## 2018-05-17 MED ORDER — ACETAMINOPHEN 10 MG/ML IV SOLN
1000.0000 mg | Freq: Four times a day (QID) | INTRAVENOUS | Status: DC
  Administered 2018-05-17: 1000 mg via INTRAVENOUS
  Filled 2018-05-17: qty 100

## 2018-05-17 MED ORDER — ACETAMINOPHEN 500 MG PO TABS
500.0000 mg | ORAL_TABLET | Freq: Four times a day (QID) | ORAL | Status: DC
  Administered 2018-05-17 – 2018-05-18 (×3): 500 mg via ORAL
  Filled 2018-05-17 (×3): qty 1

## 2018-05-17 MED ORDER — DIPHENHYDRAMINE HCL 12.5 MG/5ML PO ELIX: 12.5000 mg | ORAL_SOLUTION | ORAL | Status: DC | PRN

## 2018-05-17 MED ORDER — ONDANSETRON HCL 4 MG/2ML IJ SOLN: 4.0000 mg | Freq: Once | INTRAMUSCULAR | Status: DC | PRN

## 2018-05-17 MED ORDER — METOPROLOL TARTRATE 12.5 MG HALF TABLET
12.5000 mg | ORAL_TABLET | Freq: Two times a day (BID) | ORAL | Status: DC
  Administered 2018-05-17 – 2018-05-18 (×2): 12.5 mg via ORAL
  Filled 2018-05-17 (×2): qty 1

## 2018-05-17 MED ORDER — MIDAZOLAM HCL 2 MG/2ML IJ SOLN
INTRAMUSCULAR | Status: DC | PRN
  Administered 2018-05-17: 1 mg via INTRAVENOUS

## 2018-05-17 MED ORDER — METHOCARBAMOL 500 MG PO TABS
500.0000 mg | ORAL_TABLET | Freq: Four times a day (QID) | ORAL | Status: DC | PRN
  Administered 2018-05-18 (×2): 500 mg via ORAL
  Filled 2018-05-17 (×2): qty 1

## 2018-05-17 MED ORDER — MENTHOL 3 MG MT LOZG: 1.0000 | LOZENGE | OROMUCOSAL | Status: DC | PRN

## 2018-05-17 MED ORDER — HYDROMORPHONE HCL 1 MG/ML IJ SOLN: 0.2500 mg | INTRAMUSCULAR | Status: DC | PRN

## 2018-05-17 MED ORDER — 0.9 % SODIUM CHLORIDE (POUR BTL) OPTIME
TOPICAL | Status: DC | PRN
  Administered 2018-05-17: 1000 mL

## 2018-05-17 MED ORDER — PROPOFOL 500 MG/50ML IV EMUL
INTRAVENOUS | Status: DC | PRN
  Administered 2018-05-17: 100 ug/kg/min via INTRAVENOUS

## 2018-05-17 MED ORDER — DEXAMETHASONE SODIUM PHOSPHATE 10 MG/ML IJ SOLN
10.0000 mg | Freq: Once | INTRAMUSCULAR | Status: AC
  Administered 2018-05-18: 10 mg via INTRAVENOUS
  Filled 2018-05-17: qty 1

## 2018-05-17 MED ORDER — CHLORHEXIDINE GLUCONATE 4 % EX LIQD
60.0000 mL | Freq: Once | CUTANEOUS | Status: AC
  Administered 2018-05-17: 4 via TOPICAL

## 2018-05-17 MED ORDER — METHOCARBAMOL 500 MG IVPB - SIMPLE MED
500.0000 mg | Freq: Four times a day (QID) | INTRAVENOUS | Status: DC | PRN
  Filled 2018-05-17: qty 50

## 2018-05-17 MED ORDER — BUPIVACAINE IN DEXTROSE 0.75-8.25 % IT SOLN
INTRATHECAL | Status: DC | PRN
  Administered 2018-05-17: 1.8 mL via INTRATHECAL

## 2018-05-17 MED ORDER — DOFETILIDE 250 MCG PO CAPS
500.0000 ug | ORAL_CAPSULE | Freq: Two times a day (BID) | ORAL | Status: DC
  Administered 2018-05-17 – 2018-05-18 (×2): 500 ug via ORAL
  Filled 2018-05-17 (×2): qty 2

## 2018-05-17 MED ORDER — ONDANSETRON HCL 4 MG/2ML IJ SOLN: 4.0000 mg | Freq: Four times a day (QID) | INTRAMUSCULAR | Status: DC | PRN

## 2018-05-17 MED ORDER — LACTATED RINGERS IV SOLN
INTRAVENOUS | Status: DC
  Administered 2018-05-17: 13:00:00 via INTRAVENOUS

## 2018-05-17 SURGICAL SUPPLY — 49 items
ARTICULEZE HEAD (Hips) ×3 IMPLANT
BAG DECANTER FOR FLEXI CONT (MISCELLANEOUS) ×1 IMPLANT
BAG SPEC THK2 15X12 ZIP CLS (MISCELLANEOUS)
BAG ZIPLOCK 12X15 (MISCELLANEOUS) IMPLANT
BLADE SAG 18X100X1.27 (BLADE) ×3 IMPLANT
BLADE SURG SZ10 CARB STEEL (BLADE) ×6 IMPLANT
CLOSURE WOUND 1/2 X4 (GAUZE/BANDAGES/DRESSINGS) ×2
COVER PERINEAL POST (MISCELLANEOUS) ×3 IMPLANT
COVER SURGICAL LIGHT HANDLE (MISCELLANEOUS) ×3 IMPLANT
COVER WAND RF STERILE (DRAPES) ×2 IMPLANT
CUP ACETBLR 54 OD PINNACLE (Hips) ×2 IMPLANT
DECANTER SPIKE VIAL GLASS SM (MISCELLANEOUS) ×1 IMPLANT
DRAPE STERI IOBAN 125X83 (DRAPES) ×3 IMPLANT
DRAPE U-SHAPE 47X51 STRL (DRAPES) ×6 IMPLANT
DRSG ADAPTIC 3X8 NADH LF (GAUZE/BANDAGES/DRESSINGS) ×3 IMPLANT
DRSG MEPILEX BORDER 4X4 (GAUZE/BANDAGES/DRESSINGS) ×3 IMPLANT
DRSG MEPILEX BORDER 4X8 (GAUZE/BANDAGES/DRESSINGS) ×3 IMPLANT
DURAPREP 26ML APPLICATOR (WOUND CARE) ×3 IMPLANT
ELECT REM PT RETURN 15FT ADLT (MISCELLANEOUS) ×3 IMPLANT
EVACUATOR 1/8 PVC DRAIN (DRAIN) ×3 IMPLANT
GLOVE BIO SURGEON STRL SZ7 (GLOVE) ×1 IMPLANT
GLOVE BIO SURGEON STRL SZ8 (GLOVE) ×3 IMPLANT
GLOVE BIOGEL PI IND STRL 6.5 (GLOVE) IMPLANT
GLOVE BIOGEL PI IND STRL 7.0 (GLOVE) ×1 IMPLANT
GLOVE BIOGEL PI IND STRL 7.5 (GLOVE) IMPLANT
GLOVE BIOGEL PI IND STRL 8 (GLOVE) ×1 IMPLANT
GLOVE BIOGEL PI INDICATOR 6.5 (GLOVE) ×2
GLOVE BIOGEL PI INDICATOR 7.0 (GLOVE) ×6
GLOVE BIOGEL PI INDICATOR 7.5 (GLOVE) ×4
GLOVE BIOGEL PI INDICATOR 8 (GLOVE) ×2
GLOVE SURG SS PI 6.5 STRL IVOR (GLOVE) ×2 IMPLANT
GOWN STRL REUS W/TWL LRG LVL3 (GOWN DISPOSABLE) ×3 IMPLANT
GOWN STRL REUS W/TWL XL LVL3 (GOWN DISPOSABLE) ×7 IMPLANT
HEAD ARTICULEZE (Hips) IMPLANT
HOLDER FOLEY CATH W/STRAP (MISCELLANEOUS) ×3 IMPLANT
LINER MARATHON NEUT +4X54X36 (Hips) ×2 IMPLANT
MANIFOLD NEPTUNE II (INSTRUMENTS) ×3 IMPLANT
PACK ANTERIOR HIP CUSTOM (KITS) ×3 IMPLANT
STEM FEM ACTIS HIGH SZ3 (Stem) ×2 IMPLANT
STRIP CLOSURE SKIN 1/2X4 (GAUZE/BANDAGES/DRESSINGS) ×3 IMPLANT
SUT ETHIBOND NAB CT1 #1 30IN (SUTURE) ×3 IMPLANT
SUT MNCRL AB 4-0 PS2 18 (SUTURE) ×3 IMPLANT
SUT STRATAFIX 0 PDS 27 VIOLET (SUTURE) ×3
SUT VIC AB 2-0 CT1 27 (SUTURE) ×6
SUT VIC AB 2-0 CT1 TAPERPNT 27 (SUTURE) ×2 IMPLANT
SUTURE STRATFX 0 PDS 27 VIOLET (SUTURE) ×1 IMPLANT
SYR 50ML LL SCALE MARK (SYRINGE) IMPLANT
TRAY FOLEY MTR SLVR 16FR STAT (SET/KITS/TRAYS/PACK) ×3 IMPLANT
YANKAUER SUCT BULB TIP 10FT TU (MISCELLANEOUS) ×3 IMPLANT

## 2018-05-17 NOTE — Anesthesia Procedure Notes (Signed)
Procedure Name: MAC Date/Time: 05/17/2018 3:00 PM Performed by: Niel Hummer, CRNA Pre-anesthesia Checklist: Patient identified, Emergency Drugs available, Patient being monitored and Suction available Patient Re-evaluated:Patient Re-evaluated prior to induction Oxygen Delivery Method: Simple face mask

## 2018-05-17 NOTE — Anesthesia Postprocedure Evaluation (Signed)
Anesthesia Post Note  Patient: Daniel Tucker  Procedure(s) Performed: TOTAL HIP ARTHROPLASTY ANTERIOR APPROACH (Right Hip)     Patient location during evaluation: PACU Anesthesia Type: Spinal Level of consciousness: oriented and awake and alert Pain management: pain level controlled Vital Signs Assessment: post-procedure vital signs reviewed and stable Respiratory status: spontaneous breathing, respiratory function stable and patient connected to nasal cannula oxygen Cardiovascular status: blood pressure returned to baseline and stable Postop Assessment: no headache, no backache and no apparent nausea or vomiting Anesthetic complications: no    Last Vitals:  Vitals:   05/17/18 1700 05/17/18 1715  BP: 128/64 125/81  Pulse: (!) 56 (!) 58  Resp: 18 13  Temp:  (!) 36.4 C  SpO2: 100% 100%    Last Pain:  Vitals:   05/17/18 1715  TempSrc:   PainSc: 0-No pain                 [name] DAVID

## 2018-05-17 NOTE — Care Plan (Signed)
Ortho Bundle Case Management Note  Patient Details  Name: Daniel Tucker MRN: [id number] Date of Birth: 03-Jul-1940  R THA on 05-17-2018 DCP:  Home with spouse.  2 story home with 0 ste DME:  No needs.  Has a RW and 3-in-1 PT:  HEP                   DME Arranged:  N/A DME Agency:  NA  HH Arranged:  NA HH Agency:  NA  Additional Comments: Please contact me with any questions of if this plan should need to change.  Marianne Sofia, RN,CCM [facility]  [phone] 05/17/2018, 1:16 PM

## 2018-05-17 NOTE — Interval H&P Note (Signed)
History and Physical Interval Note:  05/17/2018 1:00 PM  Daniel Tucker  has presented today for surgery, with the diagnosis of right hip osteoarthritis  The various methods of treatment have been discussed with the patient and family. After consideration of risks, benefits and other options for treatment, the patient has consented to  Procedure(s): TOTAL HIP ARTHROPLASTY ANTERIOR APPROACH (Right) as a surgical intervention .  The patient's history has been reviewed, patient examined, no change in status, stable for surgery.  I have reviewed the patient's chart and labs.  Questions were answered to the patient's satisfaction.     Pilar Plate Danni Leabo

## 2018-05-17 NOTE — Discharge Instructions (Signed)
°Dr. Frank Aluisio °Total Joint Specialist °[facility] °[address] °[place], [place] [address] °[phone] ° °ANTERIOR APPROACH TOTAL HIP REPLACEMENT POSTOPERATIVE DIRECTIONS ° ° °Hip Rehabilitation, Guidelines Following Surgery  °The results of a hip operation are greatly improved after range of motion and muscle strengthening exercises. Follow all safety measures which are given to protect your hip. If any of these exercises cause increased pain or swelling in your joint, decrease the amount until you are comfortable again. Then slowly increase the exercises. Call your caregiver if you have problems or questions.  ° °HOME CARE INSTRUCTIONS  °• Remove items at home which could result in a fall. This includes throw rugs or furniture in walking pathways.  °· ICE to the affected hip every three hours for 30 minutes at a time and then as needed for pain and swelling.  Continue to use ice on the hip for pain and swelling from surgery. You may notice swelling that will progress down to the foot and ankle.  This is normal after surgery.  Elevate the leg when you are not up walking on it.   °· Continue to use the breathing machine which will help keep your temperature down.  It is common for your temperature to cycle up and down following surgery, especially at night when you are not up moving around and exerting yourself.  The breathing machine keeps your lungs expanded and your temperature down. ° °DIET °You may resume your previous home diet once your are discharged from the hospital. ° °DRESSING / WOUND CARE / SHOWERING °You may shower 3 days after surgery, but keep the wounds dry during showering.  You may use an occlusive plastic wrap (Press'n Seal for example), NO SOAKING/SUBMERGING IN THE BATHTUB.  If the bandage gets wet, change with a clean dry gauze.  If the incision gets wet, pat the wound dry with a clean towel. °You may start showering once you are discharged home but do not submerge the  incision under water. Just pat the incision dry and apply a dry gauze dressing on daily. °Change the surgical dressing daily and reapply a dry dressing each time. ° °ACTIVITY °Walk with your walker as instructed. °Use walker as long as suggested by your caregivers. °Avoid periods of inactivity such as sitting longer than an hour when not asleep. This helps prevent blood clots.  °You may resume a sexual relationship in one month or when given the OK by your doctor.  °You may return to work once you are cleared by your doctor.  °Do not drive a car for 6 weeks or until released by you surgeon.  °Do not drive while taking narcotics. ° °WEIGHT BEARING °Weight bearing as tolerated with assist device (walker, cane, etc) as directed, use it as long as suggested by your surgeon or therapist, typically at least 4-6 weeks. ° °POSTOPERATIVE CONSTIPATION PROTOCOL °Constipation - defined medically as fewer than three stools per week and severe constipation as less than one stool per week. ° °One of the most common issues patients have following surgery is constipation.  Even if you have a regular bowel pattern at home, your normal regimen is likely to be disrupted due to multiple reasons following surgery.  Combination of anesthesia, postoperative narcotics, change in appetite and fluid intake all can affect your bowels.  In order to avoid complications following surgery, here are some recommendations in order to help you during your recovery period. ° °Colace (docusate) - Pick up an over-the-counter form   of Colace or another stool softener and take twice a day as long as you are requiring postoperative pain medications.  Take with a full glass of water daily.  If you experience loose stools or diarrhea, hold the colace until you stool forms back up.  If your symptoms do not get better within 1 week or if they get worse, check with your doctor. ° °Dulcolax (bisacodyl) - Pick up over-the-counter and take as directed by the product  packaging as needed to assist with the movement of your bowels.  Take with a full glass of water.  Use this product as needed if not relieved by Colace only.  ° °MiraLax (polyethylene glycol) - Pick up over-the-counter to have on hand.  MiraLax is a solution that will increase the amount of water in your bowels to assist with bowel movements.  Take as directed and can mix with a glass of water, juice, soda, coffee, or tea.  Take if you go more than two days without a movement. °Do not use MiraLax more than once per day. Call your doctor if you are still constipated or irregular after using this medication for 7 days in a row. ° °If you continue to have problems with postoperative constipation, please contact the office for further assistance and recommendations.  If you experience "the worst abdominal pain ever" or develop nausea or vomiting, please contact the office immediatly for further recommendations for treatment. ° °ITCHING ° If you experience itching with your medications, try taking only a single pain pill, or even half a pain pill at a time.  You can also use Benadryl over the counter for itching or also to help with sleep.  ° °TED HOSE STOCKINGS °Wear the elastic stockings on both legs for three weeks following surgery during the day but you may remove then at night for sleeping. ° °MEDICATIONS °See your medication summary on the “After Visit Summary” that the nursing staff will review with you prior to discharge.  You may have some home medications which will be placed on hold until you complete the course of blood thinner medication.  It is important for you to complete the blood thinner medication as prescribed by your surgeon.  Continue your approved medications as instructed at time of discharge. ° °PRECAUTIONS °If you experience chest pain or shortness of breath - call 911 immediately for transfer to the hospital emergency department.  °If you develop a fever greater that 101 F, purulent drainage  from wound, increased redness or drainage from wound, foul odor from the wound/dressing, or calf pain - CONTACT YOUR SURGEON.   °                                                °FOLLOW-UP APPOINTMENTS °Make sure you keep all of your appointments after your operation with your surgeon and caregivers. You should call the office at the above phone number and make an appointment for approximately two weeks after the date of your surgery or on the date instructed by your surgeon outlined in the "After Visit Summary". ° °RANGE OF MOTION AND STRENGTHENING EXERCISES  °These exercises are designed to help you keep full movement of your hip joint. Follow your caregiver's or physical therapist's instructions. Perform all exercises about fifteen times, three times per day or as directed. Exercise both hips, even if you have   had only one joint replacement. These exercises can be done on a training (exercise) mat, on the floor, on a table or on a bed. Use whatever works the best and is most comfortable for you. Use music or television while you are exercising so that the exercises are a pleasant break in your day. This will make your life better with the exercises acting as a break in routine you can look forward to.  °• Lying on your back, slowly slide your foot toward your buttocks, raising your knee up off the floor. Then slowly slide your foot back down until your leg is straight again.  °• Lying on your back spread your legs as far apart as you can without causing discomfort.  °• Lying on your side, raise your upper leg and foot straight up from the floor as far as is comfortable. Slowly lower the leg and repeat.  °• Lying on your back, tighten up the muscle in the front of your thigh (quadriceps muscles). You can do this by keeping your leg straight and trying to raise your heel off the floor. This helps strengthen the largest muscle supporting your knee.  °• Lying on your back, tighten up the muscles of your buttocks both  with the legs straight and with the knee bent at a comfortable angle while keeping your heel on the floor.  ° °IF YOU ARE TRANSFERRED TO A SKILLED REHAB FACILITY °If the patient is transferred to a skilled rehab facility following release from the hospital, a list of the current medications will be sent to the facility for the patient to continue.  When discharged from the skilled rehab facility, please have the facility set up the patient's Home Health Physical Therapy prior to being released. Also, the skilled facility will be responsible for providing the patient with their medications at time of release from the facility to include their pain medication, the muscle relaxants, and their blood thinner medication. If the patient is still at the rehab facility at time of the two week follow up appointment, the skilled rehab facility will also need to assist the patient in arranging follow up appointment in our office and any transportation needs. ° °MAKE SURE YOU:  °• Understand these instructions.  °• Get help right away if you are not doing well or get worse.  ° ° °Pick up stool softner and laxative for home use following surgery while on pain medications. °Do not submerge incision under water. °Please use good hand washing techniques while changing dressing each day. °May shower starting three days after surgery. °Please use a clean towel to pat the incision dry following showers. °Continue to use ice for pain and swelling after surgery. °Do not use any lotions or creams on the incision until instructed by your surgeon. ° °

## 2018-05-17 NOTE — Op Note (Signed)
OPERATIVE REPORT- TOTAL HIP ARTHROPLASTY   PREOPERATIVE DIAGNOSIS: Osteoarthritis of the Right hip.   POSTOPERATIVE DIAGNOSIS: Osteoarthritis of the Right  hip.   PROCEDURE: Right total hip arthroplasty, anterior approach.   SURGEON: Gaynelle Arabian, MD   ASSISTANT: Ardeen Jourdain, PA-C  ANESTHESIA:  Spinal  ESTIMATED BLOOD LOSS:-500 mL    DRAINS: Hemovac x1.   COMPLICATIONS: None   CONDITION: PACU - hemodynamically stable.   BRIEF CLINICAL NOTE: Daniel Tucker is a 78 y.o. male who has advanced end-  stage arthritis of their Right  hip with progressively worsening pain and  dysfunction.The patient has failed nonoperative management and presents for  total hip arthroplasty.   PROCEDURE IN DETAIL: After successful administration of spinal  anesthetic, the traction boots for the [facility] bed were placed on both  feet and the patient was placed onto the [facility] bed, boots placed into the leg  holders. The Right hip was then isolated from the perineum with plastic  drapes and prepped and draped in the usual sterile fashion. ASIS and  greater trochanter were marked and a oblique incision was made, starting  at about 1 cm lateral and 2 cm distal to the ASIS and coursing towards  the anterior cortex of the femur. The skin was cut with a 10 blade  through subcutaneous tissue to the level of the fascia overlying the  tensor fascia lata muscle. The fascia was then incised in line with the  incision at the junction of the anterior third and posterior 2/3rd. The  muscle was teased off the fascia and then the interval between the TFL  and the rectus was developed. The Hohmann retractor was then placed at  the top of the femoral neck over the capsule. The vessels overlying the  capsule were cauterized and the fat on top of the capsule was removed.  A Hohmann retractor was then placed anterior underneath the rectus  femoris to give exposure to the entire anterior capsule. A T-shaped   capsulotomy was performed. The edges were tagged and the femoral head  was identified.       Osteophytes are removed off the superior acetabulum.  The femoral neck was then cut in situ with an oscillating saw. Traction  was then applied to the left lower extremity utilizing the [facility]  traction. The femoral head was then removed. Retractors were placed  around the acetabulum and then circumferential removal of the labrum was  performed. Osteophytes were also removed. Reaming starts at 49 mm to  medialize and  Increased in 2 mm increments to 53 mm. We reamed in  approximately 40 degrees of abduction, 20 degrees anteversion. A 54 mm  pinnacle acetabular shell was then impacted in anatomic position under  fluoroscopic guidance with excellent purchase. We did not need to place  any additional dome screws. A 36 mm neutral + 4 marathon liner was then  placed into the acetabular shell.       The femoral lift was then placed along the lateral aspect of the femur  just distal to the vastus ridge. The leg was  externally rotated and capsule  was stripped off the inferior aspect of the femoral neck down to the  level of the lesser trochanter, this was done with electrocautery. The femur was lifted after this was performed. The  leg was then placed in an extended and adducted position essentially delivering the femur. We also removed the capsule superiorly and the piriformis from the piriformis  fossa to gain excellent exposure of the  proximal femur. Rongeur was used to remove some cancellous bone to get  into the lateral portion of the proximal femur for placement of the  initial starter reamer. The starter broaches was placed  the starter broach  and was shown to go down the center of the canal. Broaching  with the Actis system was then performed starting at size 0  coursing  Up to size 3. A size 3 had excellent torsional and rotational  and axial stability. The trial high offset neck was then placed   with a 36 + 5 trial head. The hip was then reduced. We confirmed that  the stem was in the canal both on AP and lateral x-rays. It also has excellent sizing. The hip was reduced with outstanding stability through full extension and full external rotation.. AP pelvis was taken and the leg lengths were measured and found to be equal. Hip was then dislocated again and the femoral head and neck removed. The  femoral broach was removed. Size 3 Actis stem with a high offset  neck was then impacted into the femur following native anteversion. Has  excellent purchase in the canal. Excellent torsional and rotational and  axial stability. It is confirmed to be in the canal on AP and lateral  fluoroscopic views. The 36 + 5 metal head was placed and the hip  reduced with outstanding stability. Again AP pelvis was taken and it  confirmed that the leg lengths were equal. The wound was then copiously  irrigated with saline solution and the capsule reattached and repaired  with Ethibond suture. 30 ml of .25% Bupivicaine was  injected into the capsule and into the edge of the tensor fascia lata as well as subcutaneous tissue. The fascia overlying the tensor fascia lata was then closed with a running #1 V-Loc. Subcu was closed with interrupted 2-0 Vicryl and subcuticular running 4-0 Monocryl. Incision was cleaned  and dried. Steri-Strips and a bulky sterile dressing applied. Hemovac  drain was hooked to suction and then the patient was awakened and transported to  recovery in stable condition.        Please note that a surgical assistant was a medical necessity for this procedure to perform it in a safe and expeditious manner. Assistant was necessary to provide appropriate retraction of vital neurovascular structures and to prevent femoral fracture and allow for anatomic placement of the prosthesis.  Gaynelle Arabian, M.D.

## 2018-05-17 NOTE — Transfer of Care (Signed)
Immediate Anesthesia Transfer of Care Note  Patient: Daniel Tucker  Procedure(s) Performed: TOTAL HIP ARTHROPLASTY ANTERIOR APPROACH (Right Hip)  Patient Location: PACU  Anesthesia Type:Spinal  Level of Consciousness: awake, alert  and oriented  Airway & Oxygen Therapy: Patient Spontanous Breathing and Patient connected to face mask oxygen  Post-op Assessment: Report given to RN and Post -op Vital signs reviewed and stable  Post vital signs: Reviewed and stable  Last Vitals:  Vitals Value Taken Time  BP    Temp    Pulse 72 05/17/2018  4:35 PM  Resp 15 05/17/2018  4:35 PM  SpO2 97 % 05/17/2018  4:35 PM  Vitals shown include unvalidated device data.  Last Pain:  Vitals:   05/17/18 1303  TempSrc:   PainSc: 0-No pain         Complications: No apparent anesthesia complications

## 2018-05-17 NOTE — Anesthesia Procedure Notes (Signed)
Spinal  Patient location during procedure: OR Start time: 05/17/2018 3:03 PM End time: 05/17/2018 3:08 PM Staffing Anesthesiologist: Lillia Abed, MD Preanesthetic Checklist Completed: patient identified, surgical consent, pre-op evaluation, timeout performed, IV checked, risks and benefits discussed and monitors and equipment checked Spinal Block Patient position: sitting Prep: ChloraPrep Patient monitoring: heart rate, cardiac monitor, continuous pulse ox and blood pressure Approach: right paramedian Location: L4-5 Injection technique: single-shot Needle Needle type: Pencan

## 2018-05-18 ENCOUNTER — Encounter (HOSPITAL_COMMUNITY): Payer: Self-pay | Admitting: Orthopedic Surgery

## 2018-05-18 ENCOUNTER — Other Ambulatory Visit: Payer: Self-pay

## 2018-05-18 DIAGNOSIS — M1611 Unilateral primary osteoarthritis, right hip: Secondary | ICD-10-CM | POA: Diagnosis not present

## 2018-05-18 DIAGNOSIS — G4730 Sleep apnea, unspecified: Secondary | ICD-10-CM | POA: Diagnosis not present

## 2018-05-18 DIAGNOSIS — I10 Essential (primary) hypertension: Secondary | ICD-10-CM | POA: Diagnosis not present

## 2018-05-18 DIAGNOSIS — M25751 Osteophyte, right hip: Secondary | ICD-10-CM | POA: Diagnosis not present

## 2018-05-18 DIAGNOSIS — Z96642 Presence of left artificial hip joint: Secondary | ICD-10-CM | POA: Diagnosis not present

## 2018-05-18 DIAGNOSIS — G4733 Obstructive sleep apnea (adult) (pediatric): Secondary | ICD-10-CM | POA: Diagnosis not present

## 2018-05-18 LAB — BASIC METABOLIC PANEL
Anion gap: 7 (ref 5–15)
BUN: 22 mg/dL (ref 8–23)
CO2: 25 mmol/L (ref 22–32)
Calcium: 8.6 mg/dL — ABNORMAL LOW (ref 8.9–10.3)
Chloride: 102 mmol/L (ref 98–111)
Creatinine, Ser: 0.99 mg/dL (ref 0.61–1.24)
GFR calc Af Amer: >60 mL/min (ref >60)
GFR calc non Af Amer: >60 mL/min (ref >60)
Glucose, Bld: 159 mg/dL — ABNORMAL HIGH (ref 70–99)
Potassium: 4.2 mmol/L (ref 3.5–5.1)
Sodium: 134 mmol/L — ABNORMAL LOW (ref 135–145)

## 2018-05-18 LAB — CBC
HCT: 40.6 % (ref 39.0–52.0)
Hemoglobin: 12.7 g/dL — ABNORMAL LOW (ref 13.0–17.0)
MCH: 31.1 pg (ref 26.0–34.0)
MCHC: 31.3 g/dL (ref 30.0–36.0)
MCV: 99.3 fL (ref 80.0–100.0)
Platelets: 210 10*3/uL (ref 150–400)
RBC: 4.09 MIL/uL — ABNORMAL LOW (ref 4.22–5.81)
RDW: 12.1 % (ref 11.5–15.5)
WBC: 12.2 10*3/uL — ABNORMAL HIGH (ref 4.0–10.5)
nRBC: 0 % (ref 0.0–0.2)

## 2018-05-18 MED ORDER — HYDROCODONE-ACETAMINOPHEN 5-325 MG PO TABS: 1.0000 | ORAL_TABLET | Freq: Four times a day (QID) | ORAL | 0 refills | Status: DC | PRN

## 2018-05-18 MED ORDER — TRAMADOL HCL 50 MG PO TABS: 50.0000 mg | ORAL_TABLET | Freq: Four times a day (QID) | ORAL | 0 refills | Status: DC | PRN

## 2018-05-18 MED ORDER — METHOCARBAMOL 500 MG PO TABS: 500.0000 mg | ORAL_TABLET | Freq: Four times a day (QID) | ORAL | 0 refills | Status: DC | PRN

## 2018-05-18 NOTE — Progress Notes (Signed)
Discharged paperwork discussed with pt at the bedside.  He demonstrated understanding.  Pt to be escorted by wheelchair to main lobby. Pt stable at time of d/c.

## 2018-05-18 NOTE — Evaluation (Signed)
Physical Therapy Evaluation Patient Details Name: Daniel Tucker MRN: [id number] DOB: 21-Apr-1941 Today's Date: 05/18/2018   History of Present Illness  R DA-THA  Clinical Impression  Pt ambulated 150' with RW, no loss of balance. He performed THA HEP with supervision. From PT standpoint, he is ready to DC home.     Follow Up Recommendations Follow surgeon's recommendation for DC plan and follow-up therapies    Equipment Recommendations  None recommended by PT    Recommendations for Other Services       Precautions / Restrictions Precautions Precautions: Fall Restrictions Weight Bearing Restrictions: No      Mobility  Bed Mobility Overal bed mobility: Modified Independent             General bed mobility comments: used rail  Transfers Overall transfer level: Needs assistance Equipment used: Rolling walker (2 wheeled) Transfers: Sit to/from Stand Sit to Stand: Supervision         General transfer comment: VCs hand placement   Ambulation/Gait Ambulation/Gait assistance: Supervision Gait Distance (Feet): 150 Feet Assistive device: Rolling walker (2 wheeled) Gait Pattern/deviations: Step-to pattern;Decreased weight shift to right Gait velocity: decr   General Gait Details: VCs sequencing initially, no loss of balance  Stairs Stairs: (reviewed technique verbally, pt does not need to go up stairs at home)          Wheelchair Mobility    Modified Rankin (Stroke Patients Only)       Balance Overall balance assessment: Modified Independent                                           Pertinent Vitals/Pain Pain Assessment: 0-10 Pain Score: 4  Pain Location: R hip Pain Descriptors / Indicators: Sore Pain Intervention(s): Limited activity within patient's tolerance;Monitored during session;Premedicated before session;Ice applied    Home Living Family/patient expects to be discharged to:: Private residence Living Arrangements:  Spouse/significant other Available Help at Discharge: Family;Available 24 hours/day Type of Home: House Home Access: Level entry     Home Layout: Two level;Able to live on main level with bedroom/bathroom Home Equipment: Gilford Rile - 2 wheels;Cane - single point;Bedside commode      Prior Function Level of Independence: Independent with assistive device(s)         Comments: used cane when outside, no falls      Hand Dominance        Extremity/Trunk Assessment   Upper Extremity Assessment Upper Extremity Assessment: Overall WFL for tasks assessed    Lower Extremity Assessment Lower Extremity Assessment: RLE deficits/detail RLE Deficits / Details: R hip flexion AROM 30*, knee ext -4/5 RLE Sensation: WNL    Cervical / Trunk Assessment Cervical / Trunk Assessment: Normal  Communication   Communication: No difficulties  Cognition Arousal/Alertness: Awake/alert Behavior During Therapy: WFL for tasks assessed/performed Overall Cognitive Status: Within Functional Limits for tasks assessed                                        General Comments      Exercises Total Joint Exercises Ankle Circles/Pumps: AROM;Both;10 reps;Supine Quad Sets: AROM;Right;5 reps;Supine Short Arc Quad: AROM;Right;10 reps;Supine Heel Slides: AAROM;Right;10 reps;Supine Hip ABduction/ADduction: AAROM;Right;10 reps;Supine(also x 10 standing RLE) Long Arc Quad: AROM;Right;5 reps;Seated Knee Flexion: AROM;Right;5 reps;Standing Marching in Standing: AROM;Right;5 reps;Standing Standing  Hip Extension: AROM;Right;10 reps;Standing   Assessment/Plan    PT Assessment Patent does not need any further PT services  PT Problem List         PT Treatment Interventions      PT Goals (Current goals can be found in the Care Plan section)  Acute Rehab PT Goals Patient Stated Goal: yardwork PT Goal Formulation: All assessment and education complete, DC therapy    Frequency     Barriers  to discharge        Co-evaluation               AM-PAC PT "6 Clicks" Mobility  Outcome Measure Help needed turning from your back to your side while in a flat bed without using bedrails?: None Help needed moving from lying on your back to sitting on the side of a flat bed without using bedrails?: None Help needed moving to and from a bed to a chair (including a wheelchair)?: None Help needed standing up from a chair using your arms (e.g., wheelchair or bedside chair)?: None Help needed to walk in hospital room?: None Help needed climbing 3-5 steps with a railing? : A Little 6 Click Score: 23    End of Session Equipment Utilized During Treatment: Gait belt Activity Tolerance: Patient tolerated treatment well Patient left: in chair;with call bell/phone within reach Nurse Communication: Mobility status PT Visit Diagnosis: Pain;Difficulty in walking, not elsewhere classified (R26.2) Pain - Right/Left: Right Pain - part of body: Hip    Time: 0383-3383 PT Time Calculation (min) (ACUTE ONLY): 29 min   Charges:   PT Evaluation $PT Eval Low Complexity: 1 Low PT Treatments $Gait Training: 8-22 mins       [name] PT 05/18/2018  Acute Rehabilitation Services Pager [phone] Office [phone]

## 2018-05-18 NOTE — Progress Notes (Signed)
   Subjective: 1 Day Post-Op Procedure(s) (LRB): TOTAL HIP ARTHROPLASTY ANTERIOR APPROACH (Right) Patient reports pain as mild.   Patient seen in rounds by Dr. Wynelle Link. Patient is well, and has had no acute complaints or problems other than discomfort in the right hip. Denies chest pain or SOB. No issues overnight. Foley catheter removed this AM. We will start therapy today.   Objective: Vital signs in last 24 hours: Temp:  [97.5 F (36.4 C)-98.1 F (36.7 C)] 97.5 F (36.4 C) (01/28 0438) Pulse Rate:  [56-86] 64 (01/28 0438) Resp:  [13-18] 16 (01/28 0438) BP: (103-144)/(61-83) 137/81 (01/28 0438) SpO2:  [97 %-100 %] 98 % (01/28 0438) Weight:  [86.2 kg] 86.2 kg (01/27 1726)  Intake/Output from previous day:  Intake/Output Summary (Last 24 hours) at 05/18/2018 0734 Last data filed at 05/18/2018 0600 Gross per 24 hour  Intake 3101.6 ml  Output 2600 ml  Net 501.6 ml   Labs: Recent Labs    05/18/18 0344  HGB 12.7*   Recent Labs    05/18/18 0344  WBC 12.2*  RBC 4.09*  HCT 40.6  PLT 210   Recent Labs    05/18/18 0344  NA 134*  K 4.2  CL 102  CO2 25  BUN 22  CREATININE 0.99  GLUCOSE 159*  CALCIUM 8.6*   Exam: General - Patient is Alert and Oriented Extremity - Neurologically intact Neurovascular intact Sensation intact distally Dorsiflexion/Plantar flexion intact Dressing - dressing C/D/I Motor Function - intact, moving foot and toes well on exam.   Past Medical History:  Diagnosis Date  . Arthritis    "hips" (11/20/2014)  . Benign essential HTN   . Fasting hyperglycemia 2006   FBS 111; normal A1c  . Hepatitis ~ 1955   "yellow juandice"  . Hyperlipidemia   . Internal hemorrhoids   . Persistent atrial fibrillation 08/24/2014   CHASD2VASC score is 3 - on Apixaban  . Sleep apnea    intolerant to CPAP and now using oral device followed by Dr. Toy Cookey  . Tubular adenoma of colon 2006   Dr Fuller Plan    Assessment/Plan: 1 Day Post-Op Procedure(s)  (LRB): TOTAL HIP ARTHROPLASTY ANTERIOR APPROACH (Right) Principal Problem:   OA (osteoarthritis) of hip  Estimated body mass index is 29.76 kg/m as calculated from the following:   Height as of this encounter: 5\' 7"  (1.702 m).   Weight as of this encounter: 86.2 kg. Advance diet Up with therapy D/C IV fluids  DVT Prophylaxis - Eliquis Weight bearing as tolerated. D/C O2 and pulse ox and try on room air. Hemovac pulled without difficulty, will begin therapy.  EKG ordered due to home medication Tikosyn. QT interval at 472/467 ms, no prolongation seen. Zofran discontinued.   Plan is to go Home with HEP after hospital stay. Plan for discharge today once meeting goals with physical therapy. Follow-up in the office in 2 weeks.   Theresa Duty, PA-C Orthopedic Surgery 05/18/2018, 7:34 AM

## 2018-05-18 NOTE — Care Management Obs Status (Signed)
[place] NOTIFICATION   Patient Details  Name: Daniel Tucker MRN: [id number] Date of Birth: 1941/03/26   Medicare Observation Status Notification Given:  Yes    Guadalupe Maple, RN 05/18/2018, 12:12 PM

## 2018-05-18 NOTE — Plan of Care (Signed)
  Problem: Education: Goal: Knowledge of the prescribed therapeutic regimen will improve Outcome: Adequate for Discharge Goal: Understanding of discharge needs will improve Outcome: Adequate for Discharge Goal: Individualized Educational Video(s) Outcome: Adequate for Discharge   Problem: Activity: Goal: Ability to avoid complications of mobility impairment will improve Outcome: Adequate for Discharge Goal: Ability to tolerate increased activity will improve Outcome: Adequate for Discharge   Problem: Clinical Measurements: Goal: Postoperative complications will be avoided or minimized Outcome: Adequate for Discharge   Problem: Pain Management: Goal: Pain level will decrease with appropriate interventions Outcome: Adequate for Discharge   Problem: Skin Integrity: Goal: Will show signs of wound healing Outcome: Adequate for Discharge   Problem: Education: Goal: Knowledge of General Education information will improve Description Including pain rating scale, medication(s)/side effects and non-pharmacologic comfort measures Outcome: Adequate for Discharge   Problem: Health Behavior/Discharge Planning: Goal: Ability to manage health-related needs will improve Outcome: Adequate for Discharge   Problem: Clinical Measurements: Goal: Ability to maintain clinical measurements within normal limits will improve Outcome: Adequate for Discharge Goal: Will remain free from infection Outcome: Adequate for Discharge Goal: Diagnostic test results will improve Outcome: Adequate for Discharge Goal: Respiratory complications will improve Outcome: Adequate for Discharge Goal: Cardiovascular complication will be avoided Outcome: Adequate for Discharge   Problem: Activity: Goal: Risk for activity intolerance will decrease Outcome: Adequate for Discharge   Problem: Nutrition: Goal: Adequate nutrition will be maintained Outcome: Adequate for Discharge   Problem: Coping: Goal: Level of  anxiety will decrease Outcome: Adequate for Discharge   Problem: Elimination: Goal: Will not experience complications related to bowel motility Outcome: Adequate for Discharge Goal: Will not experience complications related to urinary retention Outcome: Adequate for Discharge   Problem: Pain Managment: Goal: General experience of comfort will improve Outcome: Adequate for Discharge   Problem: Safety: Goal: Ability to remain free from injury will improve Outcome: Adequate for Discharge   Problem: Skin Integrity: Goal: Risk for impaired skin integrity will decrease Outcome: Adequate for Discharge  Pt alert and oriented, doing well post op. Pain well controlled with PO pain meds.  Passed therapy goals, plan to d/c home today per MD order. RN will monitor.

## 2018-05-18 NOTE — Care Management CC44 (Signed)
Condition Code 44 Documentation Completed  Patient Details  Name: SVEN PINHEIRO MRN: [id number] Date of Birth: 06/17/1940   Condition Code 44 given:  Yes Patient signature on Condition Code 44 notice:  Yes Documentation of 2 MD's agreement:  Yes Code 44 added to claim:  Yes    Guadalupe Maple, RN 05/18/2018, 12:12 PM

## 2018-05-19 ENCOUNTER — Telehealth: Payer: Self-pay | Admitting: *Deleted

## 2018-05-19 NOTE — Discharge Summary (Signed)
Physician Discharge Summary   Patient ID: Daniel Tucker MRN: [id number] DOB/AGE: 27-Feb-1941 78 y.o.  Admit date: 05/17/2018 Discharge date: 05/18/2018  Primary Diagnosis: Osteoarthritis, right hip  Admission Diagnoses:  Past Medical History:  Diagnosis Date  . Arthritis    "hips" (11/20/2014)  . Benign essential HTN   . Fasting hyperglycemia 2006   FBS 111; normal A1c  . Hepatitis ~ 1955   "yellow juandice"  . Hyperlipidemia   . Internal hemorrhoids   . Persistent atrial fibrillation 08/24/2014   CHASD2VASC score is 3 - on Apixaban  . Sleep apnea    intolerant to CPAP and now using oral device followed by Dr. Toy Cookey  . Tubular adenoma of colon 2006   Dr Fuller Plan   Discharge Diagnoses:   Principal Problem:   OA (osteoarthritis) of hip  Estimated body mass index is 29.76 kg/m as calculated from the following:   Height as of this encounter: 5\' 7"  (1.702 m).   Weight as of this encounter: 86.2 kg.  Procedure:  Procedure(s) (LRB): TOTAL HIP ARTHROPLASTY ANTERIOR APPROACH (Right)   Consults: None  HPI: Daniel Tucker is a 78 y.o. male who has advanced end-stage arthritis of their Right  hip with progressively worsening pain and dysfunction.The patient has failed nonoperative management and presents for total hip arthroplasty.    Laboratory Data: Admission on 05/17/2018, Discharged on 05/18/2018  Component Date Value Ref Range Status  . WBC 05/18/2018 12.2* 4.0 - 10.5 K/uL Final  . RBC 05/18/2018 4.09* 4.22 - 5.81 MIL/uL Final  . Hemoglobin 05/18/2018 12.7* 13.0 - 17.0 g/dL Final  . HCT 05/18/2018 40.6  39.0 - 52.0 % Final  . MCV 05/18/2018 99.3  80.0 - 100.0 fL Final  . MCH 05/18/2018 31.1  26.0 - 34.0 pg Final  . MCHC 05/18/2018 31.3  30.0 - 36.0 g/dL Final  . RDW 05/18/2018 12.1  11.5 - 15.5 % Final  . Platelets 05/18/2018 210  150 - 400 K/uL Final  . nRBC 05/18/2018 0.0  0.0 - 0.2 % Final   Performed at [facility], [place] [address]  [place], [place] [address]  . Sodium 05/18/2018 134* 135 - 145 mmol/L Final  . Potassium 05/18/2018 4.2  3.5 - 5.1 mmol/L Final  . Chloride 05/18/2018 102  98 - 111 mmol/L Final  . CO2 05/18/2018 25  22 - 32 mmol/L Final  . Glucose, Bld 05/18/2018 159* 70 - 99 mg/dL Final  . BUN 05/18/2018 22  8 - 23 mg/dL Final  . Creatinine, Ser 05/18/2018 0.99  0.61 - 1.24 mg/dL Final  . Calcium 05/18/2018 8.6* 8.9 - 10.3 mg/dL Final  . GFR calc non Af Amer 05/18/2018 >60  >60 mL/min Final  . GFR calc Af Amer 05/18/2018 >60  >60 mL/min Final  . Anion gap 05/18/2018 7  5 - 15 Final   Performed at [facility], [place] [address] [place], [place] [address]  Hospital Outpatient Visit on 05/12/2018  Component Date Value Ref Range Status  . aPTT 05/12/2018 35  24 - 36 seconds Final   Performed at [facility], [place] [address], [place], [place] [address]  . Prothrombin Time 05/12/2018 14.4  11.4 - 15.2 seconds Final  . INR 05/12/2018 1.13   Final   Performed at [facility] [address], [place],  [address]  . ABO/RH(D) 05/12/2018 A POS   Final  . Antibody Screen 05/12/2018 NEG   Final  . Sample  Expiration 05/12/2018 05/20/2018   Final  . Extend sample reason 05/12/2018    Final                   Value:NO TRANSFUSIONS OR PREGNANCY IN THE PAST 3 MONTHS Performed at [facility] [address], [place], [place] [address]   . MRSA, PCR 05/12/2018 NEGATIVE  NEGATIVE Final  . Staphylococcus aureus 05/12/2018 POSITIVE* NEGATIVE Final   Comment: (NOTE) The Xpert SA Assay (FDA approved for NASAL specimens in patients 79 years of age and older), is one component of a comprehensive surveillance program. It is not intended to diagnose infection nor to guide or monitor treatment. Performed at [facility], [place] [address], [place], [place] [address]   Appointment on 05/11/2018  Component Date Value Ref Range Status    . Cholesterol 05/11/2018 169  0 - 200 mg/dL Final   ATP III Classification       Desirable:  < 200 mg/dL               Borderline High:  200 - 239 mg/dL          High:  > = 240 mg/dL  . Triglycerides 05/11/2018 137.0  0.0 - 149.0 mg/dL Final   Normal:  <150 mg/dLBorderline High:  150 - 199 mg/dL  . HDL 05/11/2018 49.10  >39.00 mg/dL Final  . VLDL 05/11/2018 27.4  0.0 - 40.0 mg/dL Final  . LDL Cholesterol 05/11/2018 92  0 - 99 mg/dL Final  . Total CHOL/HDL Ratio 05/11/2018 3   Final                  Men          Women1/2 Average Risk     3.4          3.3Average Risk          5.0          4.42X Average Risk          9.6          7.13X Average Risk          15.0          11.0                      . NonHDL 05/11/2018 119.50   Final   NOTE:  Non-HDL goal should be 30 mg/dL higher than patient's LDL goal (i.e. LDL goal of < 70 mg/dL, would have non-HDL goal of < 100 mg/dL)  . TSH 05/11/2018 2.16  0.35 - 4.50 uIU/mL Final  . Hgb A1c MFr Bld 05/11/2018 5.8  4.6 - 6.5 % Final   Glycemic Control Guidelines for People with Diabetes:Non Diabetic:  <6%Goal of Therapy: <7%Additional Action Suggested:  >8%   . Sodium 05/11/2018 139  135 - 145 mEq/L Final  . Potassium 05/11/2018 4.1  3.5 - 5.1 mEq/L Final  . Chloride 05/11/2018 102  96 - 112 mEq/L Final  . CO2 05/11/2018 29  19 - 32 mEq/L Final  . Glucose, Bld 05/11/2018 103* 70 - 99 mg/dL Final  . BUN 05/11/2018 18  6 - 23 mg/dL Final  . Creatinine, Ser 05/11/2018 1.09  0.40 - 1.50 mg/dL Final  . Total Bilirubin 05/11/2018 0.8  0.2 - 1.2 mg/dL Final  . Alkaline Phosphatase 05/11/2018 71  39 - 117 U/L Final  . AST 05/11/2018 15  0 - 37 U/L Final  . ALT  05/11/2018 12  0 - 53 U/L Final  . Total Protein 05/11/2018 7.6  6.0 - 8.3 g/dL Final  . Albumin 05/11/2018 4.5  3.5 - 5.2 g/dL Final  . Calcium 05/11/2018 9.8  8.4 - 10.5 mg/dL Final  . GFR 05/11/2018 65.61  >60.00 mL/min Final  . WBC 05/11/2018 8.1  4.0 - 10.5 K/uL Final  . RBC 05/11/2018 4.65  4.22  - 5.81 Mil/uL Final  . Hemoglobin 05/11/2018 15.2  13.0 - 17.0 g/dL Final  . HCT 05/11/2018 44.6  39.0 - 52.0 % Final  . MCV 05/11/2018 95.8  78.0 - 100.0 fl Final  . MCHC 05/11/2018 34.1  30.0 - 36.0 g/dL Final  . RDW 05/11/2018 12.8  11.5 - 15.5 % Final  . Platelets 05/11/2018 232.0  150.0 - 400.0 K/uL Final  . Neutrophils Relative % 05/11/2018 65.5  43.0 - 77.0 % Final  . Lymphocytes Relative 05/11/2018 21.6  12.0 - 46.0 % Final  . Monocytes Relative 05/11/2018 10.6  3.0 - 12.0 % Final  . Eosinophils Relative 05/11/2018 2.3  0.0 - 5.0 % Final  . Basophils Relative 05/11/2018 0.0  0.0 - 3.0 % Final  . Neutro Abs 05/11/2018 5.3  1.4 - 7.7 K/uL Final  . Lymphs Abs 05/11/2018 1.7  0.7 - 4.0 K/uL Final  . Monocytes Absolute 05/11/2018 0.9  0.1 - 1.0 K/uL Final  . Eosinophils Absolute 05/11/2018 0.2  0.0 - 0.7 K/uL Final  . Basophils Absolute 05/11/2018 0.0  0.0 - 0.1 K/uL Final     X-Rays:Dg Pelvis Portable  Result Date: 05/17/2018 CLINICAL DATA:  Status post right hip replacement. EXAM: PORTABLE PELVIS 1-2 VIEWS COMPARISON:  Radiograph of Sep 02, 2010. FINDINGS: Interval placement of right total hip arthroplasty. The acetabular and femoral components appear to be well situated. Surgical drain in other postoperative findings are seen in the surrounding soft tissues. Status post previous left hip arthroplasty. IMPRESSION: Status post right total hip arthroplasty. Electronically Signed   By: Marijo Conception, M.D.   On: 05/17/2018 17:07   Dg C-arm 1-60 Min-no Report  Result Date: 05/17/2018 Fluoroscopy was utilized by the requesting physician.  No radiographic interpretation.   Dg Hip Operative Unilat W Or W/o Pelvis Right  Result Date: 05/17/2018 CLINICAL DATA:  Right hip replacement. EXAM: OPERATIVE RIGHT HIP (WITH PELVIS IF PERFORMED) TECHNIQUE: Fluoroscopic spot image(s) were submitted for interpretation post-operatively. Fluoroscopy time was 6 seconds. COMPARISON:  Pelvic x-ray dated Sep 02, 2010. FINDINGS: Multiple intraoperative fluoroscopic images demonstrate interval right total hip arthroplasty. Components are well aligned. Prior left total hip arthroplasty. IMPRESSION: Intraoperative fluoroscopic guidance for right hip total arthroplasty. Electronically Signed   By: Titus Dubin M.D.   On: 05/17/2018 16:28    EKG: Orders placed or performed during the hospital encounter of 05/17/18  . EKG 12-Lead  . EKG 12-Lead     Hospital Course: Daniel Tucker is a 78 y.o. who was admitted to [facility]. They were brought to the operating room on 05/17/2018 and underwent Procedure(s): Rochelle.  Patient tolerated the procedure well and was later transferred to the recovery room and then to the orthopaedic floor for postoperative care. They were given PO and IV analgesics for pain control following their surgery. They were given 24 hours of postoperative antibiotics of  Anti-infectives (From admission, onward)   Start     Dose/Rate Route Frequency Ordered Stop   05/18/18 1000  acyclovir (ZOVIRAX) 200 MG capsule 400  mg  Status:  Discontinued    Note to Pharmacy:  TAKE 2 CAPSULES EVERY DAY AS DIRECTED     400 mg Oral Daily 05/17/18 1736 05/18/18 1629   05/18/18 0600  ceFAZolin (ANCEF) IVPB 2g/100 mL premix     2 g 200 mL/hr over 30 Minutes Intravenous On call to O.R. 05/17/18 1217 05/18/18 0630   05/17/18 2130  ceFAZolin (ANCEF) IVPB 2g/100 mL premix     2 g 200 mL/hr over 30 Minutes Intravenous Every 6 hours 05/17/18 1736 05/18/18 0315     and started on DVT prophylaxis in the form of Eliquis.   PT and OT were ordered for total joint protocol. Discharge planning consulted to help with postop disposition and equipment needs.  Patient had a good night on the evening of surgery. They started to get up OOB with therapy on POD #1. Pt was seen during rounds and was ready to go home pending progress with therapy. EKG was ordered due to home  medication Tikosyn. QT interval at 472/467 ms, no prolongation seen. Zofran was discontinued. Hemovac drain was pulled without difficulty. He worked with therapy on POD #1 and was meeting his goals. Pt was discharged to home later that day in stable condition.  Diet: Regular diet Activity: WBAT Follow-up: in 2 weeks Disposition: Home with HEP Discharged Condition: stable   Discharge Instructions    Call MD / Call 911   Complete by:  As directed    If you experience chest pain or shortness of breath, CALL 911 and be transported to the hospital emergency room.  If you develope a fever above 101 F, pus (white drainage) or increased drainage or redness at the wound, or calf pain, call your surgeon's office.   Change dressing   Complete by:  As directed    You may change your dressing on Wednesday, then change the dressing daily with sterile 4 x 4 inch gauze dressing and paper tape.   Constipation Prevention   Complete by:  As directed    Drink plenty of fluids.  Prune juice may be helpful.  You may use a stool softener, such as Colace (over the counter) 100 mg twice a day.  Use MiraLax (over the counter) for constipation as needed.   Diet - low sodium heart healthy   Complete by:  As directed    Discharge instructions   Complete by:  As directed    Dr. Gaynelle Arabian Total Joint Specialist [facility] [address], [place], [place] [address] [phone]  ANTERIOR APPROACH TOTAL HIP REPLACEMENT POSTOPERATIVE DIRECTIONS   Hip Rehabilitation, Guidelines Following Surgery  The results of a hip operation are greatly improved after range of motion and muscle strengthening exercises. Follow all safety measures which are given to protect your hip. If any of these exercises cause increased pain or swelling in your joint, decrease the amount until you are comfortable again. Then slowly increase the exercises. Call your caregiver if you have problems or questions.   HOME CARE  INSTRUCTIONS  Remove items at home which could result in a fall. This includes throw rugs or furniture in walking pathways.  ICE to the affected hip every three hours for 30 minutes at a time and then as needed for pain and swelling.  Continue to use ice on the hip for pain and swelling from surgery. You may notice swelling that will progress down to the foot and ankle.  This is normal after surgery.  Elevate the  leg when you are not up walking on it.   Continue to use the breathing machine which will help keep your temperature down.  It is common for your temperature to cycle up and down following surgery, especially at night when you are not up moving around and exerting yourself.  The breathing machine keeps your lungs expanded and your temperature down.  DIET You may resume your previous home diet once your are discharged from the hospital.  DRESSING / WOUND CARE / SHOWERING You may shower 3 days after surgery, but keep the wounds dry during showering.  You may use an occlusive plastic wrap (Press'n Seal for example), NO SOAKING/SUBMERGING IN THE BATHTUB.  If the bandage gets wet, change with a clean dry gauze.  If the incision gets wet, pat the wound dry with a clean towel. You may start showering once you are discharged home but do not submerge the incision under water. Just pat the incision dry and apply a dry gauze dressing on daily. Change the surgical dressing daily and reapply a dry dressing each time.  ACTIVITY Walk with your walker as instructed. Use walker as long as suggested by your caregivers. Avoid periods of inactivity such as sitting longer than an hour when not asleep. This helps prevent blood clots.  You may resume a sexual relationship in one month or when given the OK by your doctor.  You may return to work once you are cleared by your doctor.  Do not drive a car for 6 weeks or until released by you surgeon.  Do not drive while taking nWeight bearing as tolerated with  assist device (walker, cane, etc) as directed, use it as long as suggested by your surgeon or therapist, typically at least 4-6 weeks.arcotics.  WEIGHT BEARING   POSTOPERATIVE CONSTIPATION PROTOCOL Constipation - defined medically as fewer than three stools per week and severe constipation as less than one stool per week.  One of the most common issues patients have following surgery is constipation.  Even if you have a regular bowel pattern at home, your normal regimen is likely to be disrupted due to multiple reasons following surgery.  Combination of anesthesia, postoperative narcotics, change in appetite and fluid intake all can affect your bowels.  In order to avoid complications following surgery, here are some recommendations in order to help you during your recovery period.  Colace (docusate) - Pick up an over-the-counter form of Colace or another stool softener and take twice a day as long as you are requiring postoperative pain medications.  Take with a full glass of water daily.  If you experience loose stools or diarrhea, hold the colace until you stool forms back up.  If your symptoms do not get better within 1 week or if they get worse, check with your doctor.  Dulcolax (bisacodyl) - Pick up over-the-counter and take as directed by the product packaging as needed to assist with the movement of your bowels.  Take with a full glass of water.  Use this product as needed if not relieved by Colace only.   MiraLax (polyethylene glycol) - Pick up over-the-counter to have on hand.  MiraLax is a solution that will increase the amount of water in your bowels to assist with bowel movements.  Take as directed and can mix with a glass of water, juice, soda, coffee, or tea.  Take if you go more than two days without a movement. Do not use MiraLax more than once per day. Call your  doctor if you are still constipated or irregular after using this medication for 7 days in a row.  If you continue to  have problems with postoperative constipation, please contact the office for further assistance and recommendations.  If you experience "the worst abdominal pain ever" or develop nausea or vomiting, please contact the office immediatly for further recommendations for treatment.  ITCHING  If you experience itching with your medications, try taking only a single pain pill, or even half a pain pill at a time.  You can also use Benadryl over the counter for itching or also to help with sleep.   TED HOSE STOCKINGS Wear the elastic stockings on both legs for three weeks following surgery during the day but you may remove then at night for sleeping.  MEDICATIONS See your medication summary on the "After Visit Summary" that the nursing staff will review with you prior to discharge.  You may have some home medications which will be placed on hold until you complete the course of blood thinner medication.  It is important for you to complete the blood thinner medication as prescribed by your surgeon.  Continue your approved medications as instructed at time of discharge.  PRECAUTIONS If you experience chest pain or shortness of breath - call 911 immediately for transfer to the hospital emergency department.  If you develop a fever greater that 101 F, purulent drainage from wound, increased redness or drainage from wound, foul odor from the wound/dressing, or calf pain - CONTACT YOUR SURGEON.                                                   FOLLOW-UP APPOINTMENTS Make sure you keep all of your appointments after your operation with your surgeon and caregivers. You should call the office at the above phone number and make an appointment for approximately two weeks after the date of your surgery or on the date instructed by your surgeon outlined in the "After Visit Summary".  RANGE OF MOTION AND STRENGTHENING EXERCISES  These exercises are designed to help you keep full movement of your hip joint. Follow your  caregiver's or physical therapist's instructions. Perform all exercises about fifteen times, three times per day or as directed. Exercise both hips, even if you have had only one joint replacement. These exercises can be done on a training (exercise) mat, on the floor, on a table or on a bed. Use whatever works the best and is most comfortable for you. Use music or television while you are exercising so that the exercises are a pleasant break in your day. This will make your life better with the exercises acting as a break in routine you can look forward to.  Lying on your back, slowly slide your foot toward your buttocks, raising your knee up off the floor. Then slowly slide your foot back down until your leg is straight again.  Lying on your back spread your legs as far apart as you can without causing discomfort.  Lying on your side, raise your upper leg and foot straight up from the floor as far as is comfortable. Slowly lower the leg and repeat.  Lying on your back, tighten up the muscle in the front of your thigh (quadriceps muscles). You can do this by keeping your leg straight and trying to raise your heel off  the floor. This helps strengthen the largest muscle supporting your knee.  Lying on your back, tighten up the muscles of your buttocks both with the legs straight and with the knee bent at a comfortable angle while keeping your heel on the floor.   IF YOU ARE TRANSFERRED TO A SKILLED REHAB FACILITY If the patient is transferred to a skilled rehab facility following release from the hospital, a list of the current medications will be sent to the facility for the patient to continue.  When discharged from the skilled rehab facility, please have the facility set up the patient's [place] prior to being released. Also, the skilled facility will be responsible for providing the patient with their medications at time of release from the facility to include their pain medication,  the muscle relaxants, and their blood thinner medication. If the patient is still at the rehab facility at time of the two week follow up appointment, the skilled rehab facility will also need to assist the patient in arranging follow up appointment in our office and any transportation needs.  MAKE SURE YOU:  Understand these instructions.  Get help right away if you are not doing well or get worse.    Pick up stool softner and laxative for home use following surgery while on pain medications. Do not submerge incision under water. Please use good hand washing techniques while changing dressing each day. May shower starting three days after surgery. Please use a clean towel to pat the incision dry following showers. Continue to use ice for pain and swelling after surgery. Do not use any lotions or creams on the incision until instructed by your surgeon.   Do not sit on low chairs, stoools or toilet seats, as it may be difficult to get up from low surfaces   Complete by:  As directed    Driving restrictions   Complete by:  As directed    No driving for two weeks   TED hose   Complete by:  As directed    Use stockings (TED hose) for three weeks on both leg(s).  You may remove them at night for sleeping.   Weight bearing as tolerated   Complete by:  As directed      Allergies as of 05/18/2018   No Known Allergies     Medication List    TAKE these medications   acetaminophen 500 MG tablet Commonly known as:  TYLENOL Take 500 mg by mouth every 6 (six) hours as needed for moderate pain or headache.   acyclovir 200 MG capsule Commonly known as:  ZOVIRAX TAKE 2 CAPSULES EVERY DAY AS DIRECTED What changed:    how much to take  how to take this  when to take this  additional instructions   amLODipine 5 MG tablet Commonly known as:  NORVASC Take 1.5 tablets (7.5 mg total) by mouth daily.   docusate sodium 100 MG capsule Commonly known as:  COLACE Take 100 mg daily by  mouth.   dofetilide 500 MCG capsule Commonly known as:  TIKOSYN TAKE 1 CAPSULE TWICE DAILY   ELIQUIS 5 MG Tabs tablet Generic drug:  apixaban TAKE 1 TABLET TWICE DAILY What changed:  how much to take   GLUCOSAMINE SULFATE PO Take 2 tablets by mouth daily.   HYDROcodone-acetaminophen 5-325 MG tablet Commonly known as:  NORCO/VICODIN Take 1-2 tablets by mouth every 6 (six) hours as needed for severe pain.   methocarbamol 500 MG tablet Commonly known as:  ROBAXIN Take 1 tablet (500 mg total) by mouth every 6 (six) hours as needed for muscle spasms.   metoprolol tartrate 25 MG tablet Commonly known as:  LOPRESSOR TAKE 1/2 TABLET TWICE DAILY   multivitamin tablet Take 1 tablet by mouth daily.   potassium chloride SA 20 MEQ tablet Commonly known as:  K-DUR,KLOR-CON TAKE 1 TABLET FOUR TIMES DAILY What changed:  See the new instructions.   pravastatin 20 MG tablet Commonly known as:  PRAVACHOL TAKE 1 TABLET EVERY DAY   PRESERVISION AREDS 2 PO Take 1 capsule by mouth 2 (two) times daily.   traMADol 50 MG tablet Commonly known as:  ULTRAM Take 1-2 tablets (50-100 mg total) by mouth every 6 (six) hours as needed for moderate pain.   triamcinolone ointment 0.5 % Commonly known as:  KENALOG Apply 1 application topically 2 (two) times daily. What changed:    when to take this  reasons to take this   vitamin C 1000 MG tablet Take 1,000 mg by mouth daily.            Discharge Care Instructions  (From admission, onward)         Start     Ordered   05/18/18 0000  Weight bearing as tolerated     05/18/18 0739   05/18/18 0000  Change dressing    Comments:  You may change your dressing on Wednesday, then change the dressing daily with sterile 4 x 4 inch gauze dressing and paper tape.   05/18/18 0739         Follow-up Information    Gaynelle Arabian, MD. Go on 06/01/2018.   Specialty:  Orthopedic Surgery Why:  You are scheduled for a post-operative appointment  with Dr. Wynelle Link on 06-01-2018 at 1:30 pm.  Contact information: [address] [place] [address] [phone]           Signed: Theresa Duty, PA-C Orthopedic Surgery 05/19/2018, 10:42 AM

## 2018-05-19 NOTE — Telephone Encounter (Signed)
Pt was on TCM report admitted 05/17/18 for Osteoarthritis, right hip. Pt underwent TOTAL HIP ARTHROPLASTY ANTERIOR APPROACH. Procedure went well and pt was D/c 05/18/18. Will f/u in 2 weeks with Dr. Maureen Ralphs.Marland KitchenJohny Chess

## 2018-05-31 ENCOUNTER — Ambulatory Visit (INDEPENDENT_AMBULATORY_CARE_PROVIDER_SITE_OTHER): Payer: Medicare Other | Admitting: Internal Medicine

## 2018-05-31 ENCOUNTER — Telehealth: Payer: Self-pay

## 2018-05-31 ENCOUNTER — Encounter: Payer: Self-pay | Admitting: Internal Medicine

## 2018-05-31 ENCOUNTER — Ambulatory Visit: Payer: Self-pay

## 2018-05-31 ENCOUNTER — Other Ambulatory Visit (INDEPENDENT_AMBULATORY_CARE_PROVIDER_SITE_OTHER): Payer: Medicare Other

## 2018-05-31 VITALS — BP 124/70 | HR 76 | Temp 97.9°F | Resp 16 | Ht 67.0 in | Wt 189.0 lb

## 2018-05-31 DIAGNOSIS — N3000 Acute cystitis without hematuria: Secondary | ICD-10-CM | POA: Diagnosis not present

## 2018-05-31 DIAGNOSIS — R300 Dysuria: Secondary | ICD-10-CM

## 2018-05-31 LAB — POCT URINALYSIS DIPSTICK
Bilirubin, UA: NEGATIVE
Glucose, UA: NEGATIVE
Nitrite, UA: NEGATIVE
PH UA: 6 (ref 5.0–8.0)
Protein, UA: POSITIVE — AB
Spec Grav, UA: 1.025 (ref 1.010–1.025)
Urobilinogen, UA: NEGATIVE E.U./dL — AB

## 2018-05-31 LAB — URINALYSIS, ROUTINE W REFLEX MICROSCOPIC
Bilirubin Urine: NEGATIVE
Ketones, ur: 15 — AB
Nitrite: NEGATIVE
Specific Gravity, Urine: 1.02 (ref 1.000–1.030)
Total Protein, Urine: 100 — AB
Urine Glucose: NEGATIVE
Urobilinogen, UA: 1 (ref 0.0–1.0)
pH: 5.5 (ref 5.0–8.0)

## 2018-05-31 MED ORDER — CIPROFLOXACIN HCL 500 MG PO TABS: 500.0000 mg | ORAL_TABLET | Freq: Two times a day (BID) | ORAL | 0 refills | Status: DC

## 2018-05-31 NOTE — Progress Notes (Signed)
Subjective:    Patient ID: Daniel Tucker, male    DOB: [birth year], 78 y.o.   MRN: [id number]  HPI The patient is here for an acute visit for fever, dysuria.  He had a total hip replacement on 1/27 and was discharged 1/28.  His hip is healing well and there has been no complications.    His symptoms started two days ago.  He has dysuria, increased urinary frequency and a max temperature of 102.  He denies any blood in the urine, abnormal urine odor or difficulty urinating.  He denies abdominal pain, back pain, nausea and cold symptoms.   He denies SOB, palpitations and edema.   His incision from his hip surgery is not red and is healing.  He has some hip pain and is taking the pain medication as needed.     Medications and allergies reviewed with patient and updated if appropriate.  Patient Active Problem List   Diagnosis Date Noted  . OA (osteoarthritis) of hip 05/17/2018  . Pre-op examination 05/11/2018  . Hives 10/02/2017  . Prediabetes 09/05/2015  . 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 Arthritis of right hip 04/02/2015  . Atrial fibrillation ([place]) 08/24/2014  . Obstructive sleep apnea 07/28/2012  . Nonspecific abnormal electrocardiogram (ECG) (EKG) 05/15/2011  . Hyperlipidemia 10/10/2008  . Essential hypertension 10/10/2008  . History of colonic polyps 10/10/2008  . 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 acetaminophen (TYLENOL) 500 MG tablet Take 500 mg by mouth every 6 (six) hours as needed for moderate pain or headache.     Marland Kitchen acyclovir (ZOVIRAX) 200 MG capsule TAKE 2 CAPSULES EVERY DAY AS DIRECTED (Patient taking differently: Take 400 mg by mouth daily. ) 180 capsule 3  . amLODipine (NORVASC) 5 MG tablet Take 1.5 tablets (7.5 mg total) by mouth daily. 135 tablet 3  . Ascorbic Acid (VITAMIN C) 1000 MG tablet Take 1,000 mg by mouth daily.      Marland Kitchen docusate  sodium (COLACE) 100 MG capsule Take 100 mg daily by mouth.    . dofetilide (TIKOSYN) 500 MCG capsule TAKE 1 CAPSULE TWICE DAILY (Patient taking differently: Take 500 mcg by mouth 2 (two) times daily. ) 180 capsule 1  . ELIQUIS 5 MG TABS tablet TAKE 1 TABLET TWICE DAILY (Patient taking differently: Take 5 mg by mouth 2 (two) times daily. ) 180 tablet 3  . GLUCOSAMINE SULFATE PO Take 2 tablets by mouth daily.     Marland Kitchen HYDROcodone-acetaminophen (NORCO/VICODIN) 5-325 MG tablet Take 1-2 tablets by mouth every 6 (six) hours as needed for severe pain. 56 tablet 0  . methocarbamol (ROBAXIN) 500 MG tablet Take 1 tablet (500 mg total) by mouth every 6 (six) hours as needed for muscle spasms. 40 tablet 0  . metoprolol tartrate (LOPRESSOR) 25 MG tablet TAKE 1/2 TABLET TWICE DAILY (Patient taking differently: Take 12.5 mg by mouth 2 (two) times daily. ) 90 tablet 3  . Multiple Vitamin (MULTIVITAMIN) tablet Take 1 tablet by mouth daily.     . Multiple Vitamins-Minerals (PRESERVISION AREDS 2 PO) Take 1 capsule by mouth 2 (two) times daily.     . potassium chloride SA (K-DUR,KLOR-CON) 20 MEQ tablet TAKE 1 TABLET FOUR TIMES DAILY (Patient taking differently: Take 20 mEq by mouth 4 (four) times daily. ) 360 tablet 3  . pravastatin (PRAVACHOL) 20 MG tablet TAKE 1 TABLET EVERY DAY (Patient taking  differently: Take 20 mg by mouth daily. ) 90 tablet 2  . traMADol (ULTRAM) 50 MG tablet Take 1-2 tablets (50-100 mg total) by mouth every 6 (six) hours as needed for moderate pain. 40 tablet 0  . triamcinolone ointment (KENALOG) 0.5 % Apply 1 application topically 2 (two) times daily. (Patient taking differently: Apply 1 application topically 2 (two) times daily as needed (for itching). ) 30 g 0   No current facility-administered medications on file prior to visit.     Past Medical History:  Diagnosis Date  . Arthritis    "hips" (11/20/2014)  . Benign essential HTN   . Fasting hyperglycemia 2006   FBS 111; normal A1c  .  Hepatitis ~ 1955   "yellow juandice"  . Hyperlipidemia   . Internal hemorrhoids   . Persistent atrial fibrillation 08/24/2014   CHASD2VASC score is 3 - on Apixaban  . Sleep apnea    intolerant to CPAP and now using oral device followed by Dr. Toy Cookey  . Tubular adenoma of colon 2006   Dr Fuller Plan    Past Surgical History:  Procedure Laterality Date  . CARDIOVERSION N/A 10/12/2014   Procedure: CARDIOVERSION;  Surgeon: Darlin Coco, MD;  Location: [facility] ENDOSCOPY;  Service: Cardiovascular;  Laterality: N/A;  . COLONOSCOPY  2011   no polyp  . COLONOSCOPY W/ POLYPECTOMY  2006   [place]  . NASAL SINUS SURGERY  1997  . PROSTATE BIOPSY  2010   Negative,Dr.Ottelin  . TONSILLECTOMY AND ADENOIDECTOMY  1997  . 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 TOTAL HIP ARTHROPLASTY Right 05/17/2018   Procedure: TOTAL HIP ARTHROPLASTY ANTERIOR APPROACH;  Surgeon: Gaynelle Arabian, MD;  Location: [facility] ORS;  Service: Orthopedics;  Laterality: Right;  . UVULOPALATOPHARYNGOPLASTY  1997   Dr.Woliki    Social History   Socioeconomic History  . Marital status: Married    Spouse name: Not on file  . Number of children: 3  . Years of education: Not on file  . Highest education level: Not on file  Occupational History  . Occupation: retired   Scientific laboratory technician  . Financial resource strain: Not hard at all  . Food insecurity:    Worry: Never true    Inability: Never true  . Transportation needs:    Medical: No    Non-medical: No  Tobacco Use  . Smoking status: Former Smoker    Packs/day: 1.00    Years: 7.00    Pack years: 7.00    Types: Cigarettes  . Smokeless tobacco: Never Used  . Tobacco comment: smoked cigarettes age [age], up to 1ppd  Substance and Sexual Activity  . Alcohol use: Yes    Alcohol/week: 2.0 standard drinks    Types: 1 Glasses of wine, 1 Cans of beer per week    Comment: less than 1 drink per week  . Drug use: No  . Sexual activity: Yes  Lifestyle  . Physical activity:     Days per week: 4 days    Minutes per session: 50 min  . Stress: Not at all  Relationships  . Social connections:    Talks on phone: More than three times a week    Gets together: More than three times a week    Attends religious service: More than 4 times per year    Active member of club or organization: Yes    Attends meetings of clubs or organizations: More than 4 times per year    Relationship status: Married  Other Topics Concern  . Not on file  Social History Narrative  . Not on file    Family History  Problem Relation Age of Onset  . Prostate cancer Father   . Hypertension Father   . Hypertension Mother   . Prostate cancer Brother   . Melanoma Sister   . Atrial fibrillation Brother   . Melanoma Brother   . Lupus Sister   . Diabetes Neg Hx   . Heart disease Neg Hx   . Stroke Neg Hx   . Colon cancer Neg Hx     Review of Systems  Constitutional: Positive for fever.  HENT: Negative for congestion, ear pain and sore throat.   Respiratory: Negative for cough, shortness of breath and wheezing.   Cardiovascular: Negative for chest pain, palpitations and leg swelling.  Gastrointestinal: Positive for constipation (last week). Negative for abdominal pain, diarrhea, nausea and rectal pain.  Genitourinary: Positive for dysuria and frequency. Negative for difficulty urinating and hematuria.       No urine odor  Musculoskeletal: Negative for back pain.  Neurological: Negative for light-headedness and headaches.       Objective:   Vitals:   05/31/18 1003  BP: 124/70  Pulse: 76  Resp: 16  Temp: 97.9 F (36.6 C)  SpO2: 98%   BP Readings from Last 3 Encounters:  05/31/18 124/70  05/18/18 130/63  05/12/18 (!) 154/94   Wt Readings from Last 3 Encounters:  05/31/18 189 lb (85.7 kg)  05/17/18 190 lb (86.2 kg)  05/11/18 190 lb 12.8 oz (86.5 kg)   Body mass index is 29.6 kg/m.   Physical Exam Constitutional:      General: He is not in acute distress.     Appearance: Normal appearance. He is not ill-appearing or diaphoretic.  HENT:     Head: Normocephalic and atraumatic.     Right Ear: Tympanic membrane, ear canal and external ear normal.     Left Ear: Tympanic membrane, ear canal and external ear normal.     Nose: Nose normal.     Mouth/Throat:     Mouth: Mucous membranes are dry.     Pharynx: No posterior oropharyngeal erythema.  Eyes:     Conjunctiva/sclera: Conjunctivae normal.  Neck:     Musculoskeletal: Neck supple. No muscular tenderness.  Cardiovascular:     Rate and Rhythm: Normal rate and regular rhythm.  Pulmonary:     Effort: Pulmonary effort is normal. No respiratory distress.     Breath sounds: Normal breath sounds. No wheezing or rhonchi.  Abdominal:     General: There is no distension.     Palpations: Abdomen is soft.     Tenderness: There is no abdominal tenderness. There is no right CVA tenderness or left CVA tenderness.  Musculoskeletal:     Right lower leg: No edema.     Left lower leg: No edema.  Skin:    General: Skin is warm and dry.  Neurological:     Mental Status: He is alert.            Assessment & Plan:    See Problem List for Assessment and Plan of chronic medical problems.

## 2018-05-31 NOTE — Telephone Encounter (Signed)
Copied from [place] [id number]. Topic: General - Inquiry >> May 31, 2018  1:56 PM Margot Ables wrote: Reason for CRM: pt wife, Iris, called asking that results from urinalysis today be sent to Dr. Wynelle Link as pt sees him tomorrow (surgeon for hip replacement).  Phone               Fax                         E-mail              Address [phone]   [phone] Not available  [address]                                                                          [place]                                                                          [place] [address]

## 2018-05-31 NOTE — Assessment & Plan Note (Addendum)
Urine dip c/w possible UTI and given symptoms will start Cipro 500 mg BID x 10 days Increase fluids Tylenol as needed Will send urine for UA, UCx  Advised to call if fever persists or other symptoms develop - we will call him with the results of the urine culture

## 2018-05-31 NOTE — Patient Instructions (Signed)
Take the antibiotic as prescribed.  Take tylenol if needed.     Increase your water intake.   Call if no improvement     Urinary Tract Infection, Adult A urinary tract infection (UTI) is an infection of any part of the urinary tract, which includes the kidneys, ureters, bladder, and urethra. These organs make, store, and get rid of urine in the body. UTI can be a bladder infection (cystitis) or kidney infection (pyelonephritis). What are the causes? This infection may be caused by fungi, viruses, or bacteria. Bacteria are the most common cause of UTIs. This condition can also be caused by repeated incomplete emptying of the bladder during urination. What increases the risk? This condition is more likely to develop if:  You ignore your need to urinate or hold urine for long periods of time.  You do not empty your bladder completely during urination.  You wipe back to front after urinating or having a bowel movement, if you are male.  You are uncircumcised, if you are male.  You are constipated.  You have a urinary catheter that stays in place (indwelling).  You have a weak defense (immune) system.  You have a medical condition that affects your bowels, kidneys, or bladder.  You have diabetes.  You take antibiotic medicines frequently or for long periods of time, and the antibiotics no longer work well against certain types of infections (antibiotic resistance).  You take medicines that irritate your urinary tract.  You are exposed to chemicals that irritate your urinary tract.  You are male.  What are the signs or symptoms? Symptoms of this condition include:  Fever.  Frequent urination or passing small amounts of urine frequently.  Needing to urinate urgently.  Pain or burning with urination.  Urine that smells bad or unusual.  Cloudy urine.  Pain in the lower abdomen or back.  Trouble urinating.  Blood in the urine.  Vomiting or being less hungry than  normal.  Diarrhea or abdominal pain.  Vaginal discharge, if you are male.  How is this diagnosed? This condition is diagnosed with a medical history and physical exam. You will also need to provide a urine sample to test your urine. Other tests may be done, including:  Blood tests.  Sexually transmitted disease (STD) testing.  If you have had more than one UTI, a cystoscopy or imaging studies may be done to determine the cause of the infections. How is this treated? Treatment for this condition often includes a combination of two or more of the following:  Antibiotic medicine.  Other medicines to treat less common causes of UTI.  Over-the-counter medicines to treat pain.  Drinking enough water to stay hydrated.  Follow these instructions at home:  Take over-the-counter and prescription medicines only as told by your health care provider.  If you were prescribed an antibiotic, take it as told by your health care provider. Do not stop taking the antibiotic even if you start to feel better.  Avoid alcohol, caffeine, tea, and carbonated beverages. They can irritate your bladder.  Drink enough fluid to keep your urine clear or pale yellow.  Keep all follow-up visits as told by your health care provider. This is important.  Make sure to: ? Empty your bladder often and completely. Do not hold urine for long periods of time. ? Empty your bladder before and after sex. ? Wipe from front to back after a bowel movement if you are male. Use each tissue one time when you   wipe. Contact a health care provider if:  You have back pain.  You have a fever.  You feel nauseous or vomit.  Your symptoms do not get better after 3 days.  Your symptoms go away and then return. Get help right away if:  You have severe back pain or lower abdominal pain.  You are vomiting and cannot keep down any medicines or water. This information is not intended to replace advice given to you by  your health care provider. Make sure you discuss any questions you have with your health care provider. Document Released: 01/15/2005 Document Revised: 09/19/2015 Document Reviewed: 02/26/2015 Elsevier Interactive Patient Education  2018 Elsevier Inc.   

## 2018-05-31 NOTE — Telephone Encounter (Signed)
Report sent.  

## 2018-05-31 NOTE — Telephone Encounter (Signed)
Seeing you at 10:30

## 2018-05-31 NOTE — Telephone Encounter (Signed)
Phone call from wife; reported onset of fever on Saturday evening.  Temp has been as high as 102.6.  Temperature at 7:30 AM today was 102.0.  Reported pt. c/o burning, urgency, and frequency of urination since 2/8.  Denied any blood in the urine.  Reported pt. Is s/p right hip replacement of 05/17/18.  Reported incision intact without any redness/ warmth/ or drainage.  Denied cough, shortness of breath.  Using [employer] as advised post op.  C/o body aches.  Reported he is taking in oral fluids well.  Appt. Scheduled for 10:30 AM with PCP.  Care advice given per protocol.  Verb. Understanding.  Agrees with plan.           Reason for Disposition . [1] Fever > 100.0 F (37.8 C) AND [2] surgery in the last month  Answer Assessment - Initial Assessment Questions 1. TEMPERATURE: "What is the most recent temperature?"  "How was it measured?"     102.0 at 7:30 AM   2. ONSET: "When did the fever start?"      Saturday afternoon 3. SYMPTOMS: "Do you have any other symptoms besides the fever?"  (e.g., colds, headache, sore throat, earache, cough, rash, diarrhea, vomiting, abdominal pain)     Frequency, urgency, and burning of urination; c/o body aches denied cough, shortness of breath;  4. CAUSE: If there are no symptoms, ask: "What do you think is causing the fever?"     Possibly UTI 5. CONTACTS: "Does anyone else in the family have an infection?"     denied 6. TREATMENT: "What have you done so far to treat this fever?" (e.g., medications)     Tylenol 7. IMMUNOCOMPROMISE: "Do you have of the following: diabetes, HIV positive, splenectomy, cancer chemotherapy, chronic steroid treatment, transplant patient, etc."    S/p right hip replacement; per wife-no immunocompromised illness  8. PREGNANCY: "Is there any chance you are pregnant?" "When was your last menstrual period?"    n/a 9. TRAVEL: "Have you traveled out of the country in the last month?" (e.g., travel history, exposures)    No/  denied  Protocols used: FEVER-A-AH

## 2018-06-02 LAB — URINE CULTURE
MICRO NUMBER:: 172927
SPECIMEN QUALITY:: ADEQUATE

## 2018-06-17 ENCOUNTER — Encounter (HOSPITAL_COMMUNITY): Payer: Self-pay | Admitting: Nurse Practitioner

## 2018-06-17 ENCOUNTER — Ambulatory Visit (HOSPITAL_COMMUNITY)
Admission: RE | Admit: 2018-06-17 | Discharge: 2018-06-17 | Disposition: A | Discharge status: Home / Self Care | Payer: Medicare Other | Source: Ambulatory Visit | Attending: Nurse Practitioner | Admitting: Nurse Practitioner

## 2018-06-17 VITALS — BP 136/84 | HR 112 | Ht 67.0 in | Wt 185.0 lb

## 2018-06-17 DIAGNOSIS — E785 Hyperlipidemia, unspecified: Secondary | ICD-10-CM | POA: Diagnosis not present

## 2018-06-17 DIAGNOSIS — I10 Essential (primary) hypertension: Secondary | ICD-10-CM | POA: Diagnosis not present

## 2018-06-17 DIAGNOSIS — Z8042 Family history of malignant neoplasm of prostate: Secondary | ICD-10-CM | POA: Diagnosis not present

## 2018-06-17 DIAGNOSIS — Z79899 Other long term (current) drug therapy: Secondary | ICD-10-CM | POA: Diagnosis not present

## 2018-06-17 DIAGNOSIS — Z7901 Long term (current) use of anticoagulants: Secondary | ICD-10-CM | POA: Insufficient documentation

## 2018-06-17 DIAGNOSIS — I480 Paroxysmal atrial fibrillation: Secondary | ICD-10-CM

## 2018-06-17 DIAGNOSIS — Z87891 Personal history of nicotine dependence: Secondary | ICD-10-CM | POA: Diagnosis not present

## 2018-06-17 DIAGNOSIS — I4819 Other persistent atrial fibrillation: Secondary | ICD-10-CM | POA: Diagnosis not present

## 2018-06-17 DIAGNOSIS — Z8249 Family history of ischemic heart disease and other diseases of the circulatory system: Secondary | ICD-10-CM | POA: Diagnosis not present

## 2018-06-17 DIAGNOSIS — G4733 Obstructive sleep apnea (adult) (pediatric): Secondary | ICD-10-CM | POA: Diagnosis not present

## 2018-06-17 DIAGNOSIS — Z86010 Personal history of colonic polyps: Secondary | ICD-10-CM | POA: Insufficient documentation

## 2018-06-17 LAB — BASIC METABOLIC PANEL
Anion gap: 7 (ref 5–15)
BUN: 16 mg/dL (ref 8–23)
CO2: 28 mmol/L (ref 22–32)
Calcium: 9.1 mg/dL (ref 8.9–10.3)
Chloride: 102 mmol/L (ref 98–111)
Creatinine, Ser: 1.01 mg/dL (ref 0.61–1.24)
GFR calc Af Amer: >60 mL/min (ref >60)
GFR calc non Af Amer: >60 mL/min (ref >60)
Glucose, Bld: 131 mg/dL — ABNORMAL HIGH (ref 70–99)
Potassium: 5.5 mmol/L — ABNORMAL HIGH (ref 3.5–5.1)
Sodium: 137 mmol/L (ref 135–145)

## 2018-06-17 LAB — CBC
HCT: 41.6 % (ref 39.0–52.0)
Hemoglobin: 13.4 g/dL (ref 13.0–17.0)
MCH: 31.6 pg (ref 26.0–34.0)
MCHC: 32.2 g/dL (ref 30.0–36.0)
MCV: 98.1 fL (ref 80.0–100.0)
Platelets: 315 10*3/uL (ref 150–400)
RBC: 4.24 MIL/uL (ref 4.22–5.81)
RDW: 12.7 % (ref 11.5–15.5)
WBC: 8 10*3/uL (ref 4.0–10.5)
nRBC: 0 % (ref 0.0–0.2)

## 2018-06-17 LAB — MAGNESIUM: MAGNESIUM: 2.3 mg/dL (ref 1.7–2.4)

## 2018-06-17 NOTE — Progress Notes (Signed)
Patient ID: Daniel Tucker, male   DOB: 17-Aug-1940, 78 y.o.   MRN: [id number]     Primary Care Physician: Binnie Rail, MD Referring Physician: Dr. Jovita Kussmaul is a 78 y.o. male with a h/o paroxysmal afib maintaining SR on tikosyn. He is in the afib clinic for f/u. He noted an irregular pulse just this am, and EKG shows afib at 112 bpm. He did go thru a hip replacement in January without any issues. He reports an UTI  several weeks ago and was treated with a course of CIpro (not suppose to take with Tikosyn!) He maintained SR thru both of these situations. He does not know the trigger for today. He broke thru afib several months ago and extra metoprolol got him back in SR.  Today, he denies symptoms of palpitations, chest pain, shortness of breath, orthopnea, PND, lower extremity edema, dizziness, presyncope, syncope, or neurologic sequela. The patient is tolerating medications without difficulties and is otherwise without complaint today.   Past Medical History:  Diagnosis Date  . Arthritis    "hips" (11/20/2014)  . Benign essential HTN   . Fasting hyperglycemia 2006   FBS 111; normal A1c  . Hepatitis ~ 1955   "yellow juandice"  . Hyperlipidemia   . Internal hemorrhoids   . Persistent atrial fibrillation 08/24/2014   CHASD2VASC score is 3 - on Apixaban  . Sleep apnea    intolerant to CPAP and now using oral device followed by Dr. Toy Cookey  . Tubular adenoma of colon 2006   Dr Fuller Plan   Past Surgical History:  Procedure Laterality Date  . CARDIOVERSION N/A 10/12/2014   Procedure: CARDIOVERSION;  Surgeon: Darlin Coco, MD;  Location: [facility] ENDOSCOPY;  Service: Cardiovascular;  Laterality: N/A;  . COLONOSCOPY  2011   no polyp  . COLONOSCOPY W/ POLYPECTOMY  2006   [place]  . NASAL SINUS SURGERY  1997  . PROSTATE BIOPSY  2010   Negative,Dr.Ottelin  . TONSILLECTOMY AND ADENOIDECTOMY  1997  . 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 TOTAL HIP ARTHROPLASTY  Right 05/17/2018   Procedure: TOTAL HIP ARTHROPLASTY ANTERIOR APPROACH;  Surgeon: Gaynelle Arabian, MD;  Location: [facility] ORS;  Service: Orthopedics;  Laterality: Right;  . UVULOPALATOPHARYNGOPLASTY  1997   Dr.Woliki    Current Outpatient Medications  Medication Sig Dispense Refill  . acetaminophen (TYLENOL) 500 MG tablet Take 500 mg by mouth every 6 (six) hours as needed for moderate pain or headache.     Marland Kitchen acyclovir (ZOVIRAX) 200 MG capsule TAKE 2 CAPSULES EVERY DAY AS DIRECTED (Patient taking differently: Take 400 mg by mouth daily. ) 180 capsule 3  . amLODipine (NORVASC) 5 MG tablet Take 1.5 tablets (7.5 mg total) by mouth daily. 135 tablet 3  . Ascorbic Acid (VITAMIN C) 1000 MG tablet Take 1,000 mg by mouth daily.      Marland Kitchen docusate sodium (COLACE) 100 MG capsule Take 100 mg daily by mouth.    . dofetilide (TIKOSYN) 500 MCG capsule TAKE 1 CAPSULE TWICE DAILY 180 capsule 1  . ELIQUIS 5 MG TABS tablet TAKE 1 TABLET TWICE DAILY 180 tablet 3  . GLUCOSAMINE SULFATE PO Take 2 tablets by mouth daily.     . metoprolol tartrate (LOPRESSOR) 25 MG tablet TAKE 1/2 TABLET TWICE DAILY 90 tablet 3  . Multiple Vitamin (MULTIVITAMIN) tablet Take 1 tablet by mouth daily.     . Multiple Vitamins-Minerals (PRESERVISION AREDS 2 PO) Take 1 capsule by  mouth 2 (two) times daily.     . potassium chloride SA (K-DUR,KLOR-CON) 20 MEQ tablet TAKE 1 TABLET FOUR TIMES DAILY 360 tablet 3  . pravastatin (PRAVACHOL) 20 MG tablet TAKE 1 TABLET EVERY DAY 90 tablet 2  . triamcinolone ointment (KENALOG) 0.5 % Apply 1 application topically 2 (two) times daily. (Patient taking differently: Apply 1 application topically 2 (two) times daily as needed (for itching). ) 30 g 0  . ciprofloxacin (CIPRO) 500 MG tablet Take 1 tablet (500 mg total) by mouth 2 (two) times daily. (Patient not taking: Reported on 06/17/2018) 20 tablet 0  . HYDROcodone-acetaminophen (NORCO/VICODIN) 5-325 MG tablet Take 1-2 tablets by mouth every 6 (six) hours as needed  for severe pain. (Patient not taking: Reported on 06/17/2018) 56 tablet 0  . methocarbamol (ROBAXIN) 500 MG tablet Take 1 tablet (500 mg total) by mouth every 6 (six) hours as needed for muscle spasms. (Patient not taking: Reported on 06/17/2018) 40 tablet 0   No current facility-administered medications for this encounter.     No Known Allergies  Social History   Socioeconomic History  . Marital status: Married    Spouse name: Not on file  . Number of children: 3  . Years of education: Not on file  . Highest education level: Not on file  Occupational History  . Occupation: retired   Scientific laboratory technician  . Financial resource strain: Not hard at all  . Food insecurity:    Worry: Never true    Inability: Never true  . Transportation needs:    Medical: No    Non-medical: No  Tobacco Use  . Smoking status: Former Smoker    Packs/day: 1.00    Years: 7.00    Pack years: 7.00    Types: Cigarettes  . Smokeless tobacco: Never Used  . Tobacco comment: smoked cigarettes age [age], up to 1ppd  Substance and Sexual Activity  . Alcohol use: Yes    Alcohol/week: 2.0 standard drinks    Types: 1 Glasses of wine, 1 Cans of beer per week    Comment: less than 1 drink per week  . Drug use: No  . Sexual activity: Yes  Lifestyle  . Physical activity:    Days per week: 4 days    Minutes per session: 50 min  . Stress: Not at all  Relationships  . Social connections:    Talks on phone: More than three times a week    Gets together: More than three times a week    Attends religious service: More than 4 times per year    Active member of club or organization: Yes    Attends meetings of clubs or organizations: More than 4 times per year    Relationship status: Married  . Intimate partner violence:    Fear of current or ex partner: No    Emotionally abused: No    Physically abused: No    Forced sexual activity: No  Other Topics Concern  . Not on file  Social History Narrative  . Not on file      Family History  Problem Relation Age of Onset  . Prostate cancer Father   . Hypertension Father   . Hypertension Mother   . Prostate cancer Brother   . Melanoma Sister   . Atrial fibrillation Brother   . Melanoma Brother   . Lupus Sister   . Diabetes Neg Hx   . Heart disease Neg Hx   . Stroke Neg Hx   .  Colon cancer Neg Hx     ROS- All systems are reviewed and negative except as per the HPI above  Physical Exam: Vitals:   06/17/18 1352  BP: 136/84  Pulse: (!) 112  Weight: 83.9 kg  Height: 5\' 7"  (1.702 m)    GEN- The patient is well appearing, alert and oriented x 3 today.   Head- normocephalic, atraumatic Eyes-  Sclera clear, conjunctiva pink Ears- hearing intact Oropharynx- clear Neck- supple, no JVP Lymph- no cervical lymphadenopathy Lungs- Clear to ausculation bilaterally, normal work of breathing Heart- irregular rate and rhythm  no murmurs, rubs or gallops, PMI not laterally displaced GI- soft, NT, ND, + BS Extremities- no clubbing, cyanosis, or edema MS- no significant deformity or atrophy Skin- no rash or lesion Psych- euthymic mood, full affect Neuro- strength and sensation are intact  EKG- afib at 112 bpm, qrs int 130 ms,  qtc 510 ms Epic records reviewed.  Assessment and Plan: 1. Afib Mostly in SR but caught pt in afib today, he noted irregular HR this am Continue on tikosyn 500 mg bid  Take an extra 1/2 tab of metoprolol this afternoon and  pm dose as usual Continue apixaban, no bleeding issues Bmet/mag today  2.OSA Still wearing mouthpiece  3. HTN  Stable Avoid salt  4. Asymptomatic brady Improved with reduction of metoprolol   He will call the clinic in the am and report if he has returned to [place]. Edithe Dobbin, [facility] [address] [place], [place] [address] [phone]

## 2018-06-17 NOTE — Patient Instructions (Signed)
Take extra 1/2 of metoprolol when you get back home and take your PM dose at bedtime tonight.  Call in morning with report

## 2018-06-18 ENCOUNTER — Telehealth (HOSPITAL_COMMUNITY): Payer: Self-pay | Admitting: *Deleted

## 2018-06-18 NOTE — Telephone Encounter (Signed)
Pt called in stating he feels to be back in rhythm HR in the 70s. Per donna carroll np - continue increased metoprolol unless HRs return to the 50s. Pt for ekg on Monday.

## 2018-06-21 ENCOUNTER — Ambulatory Visit (HOSPITAL_COMMUNITY)
Admission: RE | Admit: 2018-06-21 | Discharge: 2018-06-21 | Disposition: A | Discharge status: Home / Self Care | Payer: Medicare Other | Source: Ambulatory Visit | Attending: Nurse Practitioner | Admitting: Nurse Practitioner

## 2018-06-21 DIAGNOSIS — Z96641 Presence of right artificial hip joint: Secondary | ICD-10-CM | POA: Insufficient documentation

## 2018-06-21 DIAGNOSIS — I480 Paroxysmal atrial fibrillation: Secondary | ICD-10-CM | POA: Insufficient documentation

## 2018-06-21 LAB — BASIC METABOLIC PANEL
Anion gap: 7 (ref 5–15)
BUN: 15 mg/dL (ref 8–23)
CO2: 28 mmol/L (ref 22–32)
Calcium: 9.3 mg/dL (ref 8.9–10.3)
Chloride: 102 mmol/L (ref 98–111)
Creatinine, Ser: 0.99 mg/dL (ref 0.61–1.24)
GFR calc Af Amer: >60 mL/min (ref >60)
GFR calc non Af Amer: >60 mL/min (ref >60)
Glucose, Bld: 118 mg/dL — ABNORMAL HIGH (ref 70–99)
Potassium: 3.9 mmol/L (ref 3.5–5.1)
Sodium: 137 mmol/L (ref 135–145)

## 2018-06-21 NOTE — Progress Notes (Addendum)
Pt in for repeat EKG and BMET.  To be reviewed by Roderic Palau, NP  Pt in today to confirm that he did revert back to SR. He has been taking an extra metoprolol  1/2 tab mid day. He can go back to BID dosing now. Bmet redrawn to check K+ as it was hemolyzed last week. F/u in 6 months for Tikosyn surveillance.

## 2018-06-22 DIAGNOSIS — Z96641 Presence of right artificial hip joint: Secondary | ICD-10-CM | POA: Diagnosis not present

## 2018-06-22 DIAGNOSIS — Z471 Aftercare following joint replacement surgery: Secondary | ICD-10-CM | POA: Diagnosis not present

## 2018-08-07 ENCOUNTER — Other Ambulatory Visit: Payer: Self-pay | Admitting: Internal Medicine

## 2018-08-07 ENCOUNTER — Other Ambulatory Visit (HOSPITAL_COMMUNITY): Payer: Self-pay | Admitting: Nurse Practitioner

## 2018-10-05 ENCOUNTER — Other Ambulatory Visit (HOSPITAL_COMMUNITY): Payer: Self-pay | Admitting: Nurse Practitioner

## 2018-10-11 ENCOUNTER — Other Ambulatory Visit (HOSPITAL_COMMUNITY): Payer: Self-pay | Admitting: Nurse Practitioner

## 2018-10-11 DIAGNOSIS — I480 Paroxysmal atrial fibrillation: Secondary | ICD-10-CM

## 2018-10-28 ENCOUNTER — Telehealth: Payer: Self-pay | Admitting: *Deleted

## 2018-10-28 NOTE — Telephone Encounter (Signed)
LVM for patient to call back in regards to scheduling AWV with our health coach. Discussed doing a virtual/ audio visit and asked patient to call nurse back at [phone] to schedule.

## 2018-10-29 ENCOUNTER — Other Ambulatory Visit (HOSPITAL_COMMUNITY): Payer: Self-pay | Admitting: Nurse Practitioner

## 2018-10-29 ENCOUNTER — Other Ambulatory Visit: Payer: Self-pay | Admitting: Internal Medicine

## 2018-11-18 ENCOUNTER — Other Ambulatory Visit: Payer: Self-pay

## 2018-12-03 ENCOUNTER — Other Ambulatory Visit: Payer: Self-pay

## 2018-12-03 ENCOUNTER — Ambulatory Visit (INDEPENDENT_AMBULATORY_CARE_PROVIDER_SITE_OTHER): Payer: Medicare Other | Admitting: Internal Medicine

## 2018-12-03 ENCOUNTER — Encounter: Payer: Self-pay | Admitting: Internal Medicine

## 2018-12-03 VITALS — BP 150/80 | HR 71 | Temp 97.5°F | Ht 67.0 in | Wt 186.0 lb

## 2018-12-03 DIAGNOSIS — H00012 Hordeolum externum right lower eyelid: Secondary | ICD-10-CM | POA: Diagnosis not present

## 2018-12-03 MED ORDER — AMOXICILLIN-POT CLAVULANATE 875-125 MG PO TABS: 1.0000 | ORAL_TABLET | Freq: Two times a day (BID) | ORAL | 0 refills | Status: DC

## 2018-12-03 MED ORDER — ERYTHROMYCIN 5 MG/GM OP OINT: 1.0000 "application " | TOPICAL_OINTMENT | Freq: Every day | OPHTHALMIC | 0 refills | Status: DC

## 2018-12-03 NOTE — Progress Notes (Signed)
Subjective:    Patient ID: Daniel Tucker, male    DOB: 1940/08/20, 78 y.o.   MRN: [id number]  HPI The patient is here for an acute visit.  He noticed a small bump on his right lower eyelid a couple of days ago.  Yesterday became more red, swollen and he was concerned about the possibility of an infection.  He denies any pain.  He denies any other eye symptoms, including eye pain, changes in vision or discharge.  He denies any fevers or chills.  He thinks he may have had a stye years ago, but does not even remember it.    Medications and allergies reviewed with patient and updated if appropriate.  Patient Active Problem List   Diagnosis Date Noted  . History of right hip replacement 06/21/2018  . Acute cystitis without hematuria 05/31/2018  . OA (osteoarthritis) of hip 05/17/2018  . Pre-op examination 05/11/2018  . Hives 10/02/2017  . Prediabetes 09/05/2015  . 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 Arthritis of right hip 04/02/2015  . Atrial fibrillation ([place]) 08/24/2014  . Obstructive sleep apnea 07/28/2012  . Nonspecific abnormal electrocardiogram (ECG) (EKG) 05/15/2011  . Hyperlipidemia 10/10/2008  . Essential hypertension 10/10/2008  . History of colonic polyps 10/10/2008  . 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 acetaminophen (TYLENOL) 500 MG tablet Take 500 mg by mouth every 6 (six) hours as needed for moderate pain or headache.     Marland Kitchen acyclovir (ZOVIRAX) 200 MG capsule TAKE 2 CAPSULES EVERY DAY AS DIRECTED 180 capsule 1  . amLODipine (NORVASC) 5 MG tablet TAKE 1 AND 1/2 TABLETS EVERY DAY 135 tablet 1  . Ascorbic Acid (VITAMIN C) 1000 MG tablet Take 1,000 mg by mouth daily.      Marland Kitchen docusate sodium (COLACE) 100 MG capsule Take 100 mg daily by mouth.    . dofetilide (TIKOSYN) 500 MCG capsule TAKE 1 CAPSULE TWICE DAILY 180 capsule 2  . ELIQUIS 5 MG TABS tablet  TAKE 1 TABLET TWICE DAILY 180 tablet 3  . GLUCOSAMINE SULFATE PO Take 2 tablets by mouth daily.     . metoprolol tartrate (LOPRESSOR) 25 MG tablet TAKE 1/2 TABLET TWICE DAILY 90 tablet 3  . Multiple Vitamin (MULTIVITAMIN) tablet Take 1 tablet by mouth daily.     . Multiple Vitamins-Minerals (PRESERVISION AREDS 2 PO) Take 1 capsule by mouth 2 (two) times daily.     . potassium chloride SA (K-DUR) 20 MEQ tablet TAKE 1 TABLET FOUR TIMES DAILY 360 tablet 3  . pravastatin (PRAVACHOL) 20 MG tablet Take 1 tablet (20 mg total) by mouth daily. Need office visit for more refills 90 tablet 0   No current facility-administered medications on file prior to visit.     Past Medical History:  Diagnosis Date  . Arthritis    "hips" (11/20/2014)  . Benign essential HTN   . Fasting hyperglycemia 2006   FBS 111; normal A1c  . Hepatitis ~ 1955   "yellow juandice"  . Hyperlipidemia   . Internal hemorrhoids   . Persistent atrial fibrillation 08/24/2014   CHASD2VASC score is 3 - on Apixaban  . Sleep apnea    intolerant to CPAP and now using oral device followed by Dr. Toy Cookey  . Tubular adenoma of colon 2006   Dr Fuller Plan    Past Surgical History:  Procedure Laterality Date  . CARDIOVERSION N/A 10/12/2014  Procedure: CARDIOVERSION;  Surgeon: Darlin Coco, MD;  [facility] ENDOSCOPY;  Service: Cardiovascular;  Laterality: N/A;  . COLONOSCOPY  2011   no polyp  . COLONOSCOPY W/ POLYPECTOMY  2006   [place]  . NASAL SINUS SURGERY  1997  . PROSTATE BIOPSY  2010   Negative,Dr.Ottelin  . TONSILLECTOMY AND ADENOIDECTOMY  1997  . 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 TOTAL HIP ARTHROPLASTY Right 05/17/2018   Procedure: TOTAL HIP ARTHROPLASTY ANTERIOR APPROACH;  Surgeon: Gaynelle Arabian, MD;  Location: [facility] ORS;  Service: Orthopedics;  Laterality: Right;  . UVULOPALATOPHARYNGOPLASTY  1997   Dr.Woliki    Social History   Socioeconomic History  . Marital status: Married    Spouse name: Not on  file  . Number of children: 3  . Years of education: Not on file  . Highest education level: Not on file  Occupational History  . Occupation: retired   Scientific laboratory technician  . Financial resource strain: Not hard at all  . Food insecurity    Worry: Never true    Inability: Never true  . Transportation needs    Medical: No    Non-medical: No  Tobacco Use  . Smoking status: Former Smoker    Packs/day: 1.00    Years: 7.00    Pack years: 7.00    Types: Cigarettes  . Smokeless tobacco: Never Used  . Tobacco comment: smoked cigarettes age [age], up to 1ppd  Substance and Sexual Activity  . Alcohol use: Yes    Alcohol/week: 2.0 standard drinks    Types: 1 Glasses of wine, 1 Cans of beer per week    Comment: less than 1 drink per week  . Drug use: No  . Sexual activity: Yes  Lifestyle  . Physical activity    Days per week: 4 days    Minutes per session: 50 min  . Stress: Not at all  Relationships  . Social connections    Talks on phone: More than three times a week    Gets together: More than three times a week    Attends religious service: More than 4 times per year    Active member of club or organization: Yes    Attends meetings of clubs or organizations: More than 4 times per year    Relationship status: Married  Other Topics Concern  . Not on file  Social History Narrative  . Not on file    Family History  Problem Relation Age of Onset  . Prostate cancer Father   . Hypertension Father   . Hypertension Mother   . Prostate cancer Brother   . Melanoma Sister   . Atrial fibrillation Brother   . Melanoma Brother   . Lupus Sister   . Diabetes Neg Hx   . Heart disease Neg Hx   . Stroke Neg Hx   . Colon cancer Neg Hx     Review of Systems  Constitutional: Negative for chills and fever.  Eyes: Negative for photophobia, pain, discharge, redness, itching and visual disturbance.  Skin: Positive for color change.       Objective:   Vitals:   12/03/18 1530  BP: (!)  150/80  Pulse: 71  Temp: (!) 97.5 F (36.4 C)  SpO2: 97%   BP Readings from Last 3 Encounters:  12/03/18 (!) 150/80  06/17/18 136/84  05/31/18 124/70   Wt Readings from Last 3 Encounters:  12/03/18 186 lb (84.4 kg)  06/17/18 185 lb (83.9 kg)  05/31/18 189 lb (85.7 kg)   Body mass index is 29.13 kg/m.   Physical Exam Constitutional:      Appearance: Normal appearance.  HENT:     Head: Normocephalic and atraumatic.  Eyes:     General: No scleral icterus.       Right eye: No discharge.        Left eye: No discharge.     Extraocular Movements: Extraocular movements intact.     Conjunctiva/sclera: Conjunctivae normal.     Comments: Right lower eyelid-medial aspect, stye that is erythematous the size of a small pea, no surrounding erythema or swelling  Neurological:     Mental Status: He is alert.            Assessment & Plan:    See Problem List for Assessment and Plan of chronic medical problems.

## 2018-12-03 NOTE — Patient Instructions (Addendum)
Apply warm compresses.  Use the topical antibiotic.  If there is no improvement take the oral antibiotic.    If there is no improvement after that you should see your eye doctor.     Stye  A stye, also known as a hordeolum, is a bump that forms on an eyelid. It may look like a pimple next to the eyelash. A stye can form inside the eyelid (internal stye) or outside the eyelid (external stye). A stye can cause redness, swelling, and pain on the eyelid. Styes are very common. Anyone can get them at any age. They usually occur in just one eye, but you may have more than one in either eye. What are the causes? A stye is caused by an infection. The infection is almost always caused by bacteria called Staphylococcus aureus. This is a common type of bacteria that lives on the skin. An internal stye may result from an infected oil-producing gland inside the eyelid. An external stye may be caused by an infection at the base of the eyelash (hair follicle). What increases the risk? You are more likely to develop a stye if:  You have had a stye before.  You have any of these conditions: ? Diabetes. ? Red, itchy, inflamed eyelids (blepharitis). ? A skin condition such as seborrheic dermatitis or rosacea. ? High fat levels in your blood (lipids). What are the signs or symptoms? The most common symptom of a stye is eyelid pain. Internal styes are more painful than external styes. Other symptoms may include:  Painful swelling of your eyelid.  A scratchy feeling in your eye.  Tearing and redness of your eye.  Pus draining from the stye. How is this diagnosed? Your health care provider may be able to diagnose a stye just by examining your eye. The health care provider may also check to make sure:  You do not have a fever or other signs of a more serious infection.  The infection has not spread to other parts of your eye or areas around your eye. How is this treated? Most styes will clear up in  a few days without treatment or with warm compresses applied to the area. You may need to use antibiotic drops or ointment to treat an infection. In some cases, if your stye does not heal with routine treatment, your health care provider may drain pus from the stye using a thin blade or needle. This may be done if the stye is large, causing a lot of pain, or affecting your vision. Follow these instructions at home:  Take over-the-counter and prescription medicines only as told by your health care provider. This includes eye drops or ointments.  If you were prescribed an antibiotic medicine, apply or use it as told by your health care provider. Do not stop using the antibiotic even if your condition improves.  Apply a warm, wet cloth (warm compress) to your eye for 5-10 minutes, 4 times a day.  Clean the affected eyelid as directed by your health care provider.  Do not wear contact lenses or eye makeup until your stye has healed.  Do not try to pop or drain the stye.  Do not rub your eye. Contact a health care provider if:  You have chills or a fever.  Your stye does not go away after several days.  Your stye affects your vision.  Your eyeball becomes swollen, red, or painful. Get help right away if:  You have pain when moving your eye  around. Summary  A stye is a bump that forms on an eyelid. It may look like a pimple next to the eyelash.  A stye can form inside the eyelid (internal stye) or outside the eyelid (external stye). A stye can cause redness, swelling, and pain on the eyelid.  Your health care provider may be able to diagnose a stye just by examining your eye.  Apply a warm, wet cloth (warm compress) to your eye for 5-10 minutes, 4 times a day. This information is not intended to replace advice given to you by your health care provider. Make sure you discuss any questions you have with your health care provider. Document Released: 01/15/2005 Document Revised:  03/20/2017 Document Reviewed: 12/18/2016 Elsevier Patient Education  2020 [employer].

## 2018-12-03 NOTE — Assessment & Plan Note (Signed)
History and exam consistent with a stye of right lower eyelid Warm compresses Erythromycin ointment Printed prescription of Augmentin given if there is no improvement after the next couple of days with the above treatment If no improvement after that advised for him to see his ophthalmologist.  Call with any questions

## 2018-12-16 ENCOUNTER — Other Ambulatory Visit: Payer: Self-pay

## 2018-12-16 ENCOUNTER — Ambulatory Visit (HOSPITAL_COMMUNITY)
Admission: RE | Admit: 2018-12-16 | Discharge: 2018-12-16 | Disposition: A | Discharge status: Home / Self Care | Payer: Medicare Other | Source: Ambulatory Visit | Attending: Nurse Practitioner | Admitting: Nurse Practitioner

## 2018-12-16 ENCOUNTER — Encounter (HOSPITAL_COMMUNITY): Payer: Self-pay | Admitting: Nurse Practitioner

## 2018-12-16 ENCOUNTER — Other Ambulatory Visit: Payer: Self-pay | Admitting: Internal Medicine

## 2018-12-16 VITALS — BP 150/70 | HR 60 | Ht 67.0 in | Wt 184.8 lb

## 2018-12-16 DIAGNOSIS — Z7901 Long term (current) use of anticoagulants: Secondary | ICD-10-CM | POA: Diagnosis not present

## 2018-12-16 DIAGNOSIS — I10 Essential (primary) hypertension: Secondary | ICD-10-CM | POA: Diagnosis not present

## 2018-12-16 DIAGNOSIS — I480 Paroxysmal atrial fibrillation: Secondary | ICD-10-CM | POA: Diagnosis not present

## 2018-12-16 DIAGNOSIS — E785 Hyperlipidemia, unspecified: Secondary | ICD-10-CM | POA: Diagnosis not present

## 2018-12-16 DIAGNOSIS — G4733 Obstructive sleep apnea (adult) (pediatric): Secondary | ICD-10-CM | POA: Diagnosis not present

## 2018-12-16 DIAGNOSIS — Z8249 Family history of ischemic heart disease and other diseases of the circulatory system: Secondary | ICD-10-CM | POA: Insufficient documentation

## 2018-12-16 DIAGNOSIS — I4891 Unspecified atrial fibrillation: Secondary | ICD-10-CM | POA: Insufficient documentation

## 2018-12-16 DIAGNOSIS — Z79899 Other long term (current) drug therapy: Secondary | ICD-10-CM | POA: Diagnosis not present

## 2018-12-16 DIAGNOSIS — Z87891 Personal history of nicotine dependence: Secondary | ICD-10-CM | POA: Insufficient documentation

## 2018-12-16 LAB — BASIC METABOLIC PANEL
Anion gap: 10 (ref 5–15)
BUN: 20 mg/dL (ref 8–23)
CO2: 26 mmol/L (ref 22–32)
Calcium: 9.1 mg/dL (ref 8.9–10.3)
Chloride: 102 mmol/L (ref 98–111)
Creatinine, Ser: 1.15 mg/dL (ref 0.61–1.24)
GFR calc Af Amer: >60 mL/min (ref >60)
GFR calc non Af Amer: >60 mL/min (ref >60)
Glucose, Bld: 122 mg/dL — ABNORMAL HIGH (ref 70–99)
Potassium: 4.2 mmol/L (ref 3.5–5.1)
Sodium: 138 mmol/L (ref 135–145)

## 2018-12-16 LAB — MAGNESIUM: Magnesium: 2.4 mg/dL (ref 1.7–2.4)

## 2018-12-16 NOTE — Progress Notes (Signed)
Patient ID: Daniel Tucker, male   DOB: [birth year], 78 y.o.   MRN: [id number]     Primary Care Physician: Binnie Rail, MD Referring Physician: Dr. Jovita Kussmaul is a 78 y.o. male with a h/o paroxysmal afib maintaining SR on tikosyn. He is in the afib clinic for f/u surveillance of Tikosyn. He did go thru a hip replacement in February 2020 without any issues. No recent afib to report. Qtc is stable.  Today, he denies symptoms of palpitations, chest pain, shortness of breath, orthopnea, PND, lower extremity edema, dizziness, presyncope, syncope, or neurologic sequela. The patient is tolerating medications without difficulties and is otherwise without complaint today.   Past Medical History:  Diagnosis Date  . Arthritis    "hips" (11/20/2014)  . Benign essential HTN   . Fasting hyperglycemia 2006   FBS 111; normal A1c  . Hepatitis ~ 1955   "yellow juandice"  . Hyperlipidemia   . Internal hemorrhoids   . Persistent atrial fibrillation 08/24/2014   CHASD2VASC score is 3 - on Apixaban  . Sleep apnea    intolerant to CPAP and now using oral device followed by Dr. Toy Cookey  . Tubular adenoma of colon 2006   Dr Fuller Plan   Past Surgical History:  Procedure Laterality Date  . CARDIOVERSION N/A 10/12/2014   Procedure: CARDIOVERSION;  Surgeon: Darlin Coco, MD;  Location[facility] ENDOSCOPY;  Service: Cardiovascular;  Laterality: N/A;  . COLONOSCOPY  2011   no polyp  . COLONOSCOPY W/ POLYPECTOMY  2006   [place]  . NASAL SINUS SURGERY  1997  . PROSTATE BIOPSY  2010   Negative,Dr.Ottelin  . TONSILLECTOMY AND ADENOIDECTOMY  1997  . 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 TOTAL HIP ARTHROPLASTY Right 05/17/2018   Procedure: TOTAL HIP ARTHROPLASTY ANTERIOR APPROACH;  Surgeon: Gaynelle Arabian, MD;  Location: [facility] ORS;  Service: Orthopedics;  Laterality: Right;  . UVULOPALATOPHARYNGOPLASTY  1997   Dr.Woliki    Current Outpatient Medications  Medication Sig Dispense  Refill  . acetaminophen (TYLENOL) 500 MG tablet Take 500 mg by mouth every 6 (six) hours as needed for moderate pain or headache.     Marland Kitchen acyclovir (ZOVIRAX) 200 MG capsule TAKE 2 CAPSULES EVERY DAY AS DIRECTED 180 capsule 1  . amLODipine (NORVASC) 5 MG tablet TAKE 1 AND 1/2 TABLETS EVERY DAY 135 tablet 1  . Ascorbic Acid (VITAMIN C) 1000 MG tablet Take 1,000 mg by mouth daily.      Marland Kitchen docusate sodium (COLACE) 100 MG capsule Take 100 mg daily by mouth.    . dofetilide (TIKOSYN) 500 MCG capsule TAKE 1 CAPSULE TWICE DAILY 180 capsule 2  . ELIQUIS 5 MG TABS tablet TAKE 1 TABLET TWICE DAILY 180 tablet 3  . GLUCOSAMINE SULFATE PO Take 2 tablets by mouth daily.     . metoprolol tartrate (LOPRESSOR) 25 MG tablet TAKE 1/2 TABLET TWICE DAILY 90 tablet 3  . Multiple Vitamin (MULTIVITAMIN) tablet Take 1 tablet by mouth daily.     . Multiple Vitamins-Minerals (PRESERVISION AREDS 2 PO) Take 1 capsule by mouth 2 (two) times daily.     . potassium chloride SA (K-DUR) 20 MEQ tablet TAKE 1 TABLET FOUR TIMES DAILY 360 tablet 3  . pravastatin (PRAVACHOL) 20 MG tablet Take 1 tablet (20 mg total) by mouth daily. Need office visit for more refills 90 tablet 0  . amoxicillin-clavulanate (AUGMENTIN) 875-125 MG tablet Take 1 tablet by mouth 2 (two) times daily. (Patient  not taking: Reported on 12/16/2018) 14 tablet 0  . erythromycin ophthalmic ointment Place 1 application into the right eye at bedtime. (Patient not taking: Reported on 12/16/2018) 3.5 g 0   No current facility-administered medications for this encounter.     No Known Allergies  Social History   Socioeconomic History  . Marital status: Married    Spouse name: Not on file  . Number of children: 3  . Years of education: Not on file  . Highest education level: Not on file  Occupational History  . Occupation: retired   Scientific laboratory technician  . Financial resource strain: Not hard at all  . Food insecurity    Worry: Never true    Inability: Never true  .  Transportation needs    Medical: No    Non-medical: No  Tobacco Use  . Smoking status: Former Smoker    Packs/day: 1.00    Years: 7.00    Pack years: 7.00    Types: Cigarettes  . Smokeless tobacco: Never Used  . Tobacco comment: smoked cigarettes age [age], up to 1ppd  Substance and Sexual Activity  . Alcohol use: Yes    Alcohol/week: 2.0 standard drinks    Types: 1 Glasses of wine, 1 Cans of beer per week    Comment: less than 1 drink per week  . Drug use: No  . Sexual activity: Yes  Lifestyle  . Physical activity    Days per week: 4 days    Minutes per session: 50 min  . Stress: Not at all  Relationships  . Social connections    Talks on phone: More than three times a week    Gets together: More than three times a week    Attends religious service: More than 4 times per year    Active member of club or organization: Yes    Attends meetings of clubs or organizations: More than 4 times per year    Relationship status: Married  . Intimate partner violence    Fear of current or ex partner: No    Emotionally abused: No    Physically abused: No    Forced sexual activity: No  Other Topics Concern  . Not on file  Social History Narrative  . Not on file    Family History  Problem Relation Age of Onset  . Prostate cancer Father   . Hypertension Father   . Hypertension Mother   . Prostate cancer Brother   . Melanoma Sister   . Atrial fibrillation Brother   . Melanoma Brother   . Lupus Sister   . Diabetes Neg Hx   . Heart disease Neg Hx   . Stroke Neg Hx   . Colon cancer Neg Hx     ROS- All systems are reviewed and negative except as per the HPI above  Physical Exam: Vitals:   12/16/18 1014  BP: (!) 150/70  Pulse: 60  Weight: 83.8 kg  Height: 5\' 7"  (1.702 m)    GEN- The patient is well appearing, alert and oriented x 3 today.   Head- normocephalic, atraumatic Eyes-  Sclera clear, conjunctiva pink Ears- hearing intact Oropharynx- clear Neck- supple, no  JVP Lymph- no cervical lymphadenopathy Lungs- Clear to ausculation bilaterally, normal work of breathing Heart-  regular rate and rhythm  no murmurs, rubs or gallops, PMI not laterally displaced GI- soft, NT, ND, + BS Extremities- no clubbing, cyanosis, or edema MS- no significant deformity or atrophy Skin- no rash or lesion Psych-  euthymic mood, full affect Neuro- strength and sensation are intact  EKG- NSR at 60 bpm, PR int 152 ms, qrs int 134 ms, qtc 466 ms Epic records reviewed.  Assessment and Plan: 1. Afib  Low afib burden Continue on tikosyn 500 mg bid  qtc stable Continue apixaban, no bleeding issues Bmet/mag today  2.OSA Still wearing mouthpiece  3. HTN  Stable Avoid salt  4. Asymptomatic brady Improved with reduction of metoprolol   F/u in 6 months   Butch Penny C. Navea Woodrow, [facility] [address] [place], [place] [address] [phone]

## 2019-01-03 DIAGNOSIS — Z23 Encounter for immunization: Secondary | ICD-10-CM | POA: Diagnosis not present

## 2019-01-16 NOTE — Progress Notes (Signed)
Virtual Visit via Video Note  I connected with Daniel Tucker on 01/17/19 at  9:45 AM EDT by a video enabled telemedicine application and verified that I am speaking with the correct person using two identifiers.   I discussed the limitations of evaluation and management by telemedicine and the availability of in person appointments. The patient expressed understanding and agreed to proceed.  The patient is currently at home and I am in the office.    No referring provider.    History of Present Illness: He is here for follow up of his chronic medical conditions.    He is very active - yard work.     Afib, Hypertension: He is taking his medication daily. He is compliant with a low sodium diet.  He denies chest pain, palpitations, edema, shortness of breath and regular headaches.  He does monitor his blood pressure at home - 131/79 this morning.    Hyperlipidemia: He is taking his medication daily. He is compliant with a low fat/cholesterol diet. He denies myalgias.   Prediabetes:  He is compliant with a low sugar/carbohydrate diet.  He is not exercising regularly.  Oral herpes simplex:  He takes acyclovir daily.  The medication works well.  He is due for a refill.     Review of Systems  Constitutional: Negative for chills and fever.  Respiratory: Negative for cough, shortness of breath and wheezing.   Cardiovascular: Negative for chest pain, palpitations and leg swelling.  Gastrointestinal: Negative for abdominal pain and heartburn.  Neurological: Negative for dizziness and headaches.     Social History   Socioeconomic History  . Marital status: Married    Spouse name: Not on file  . Number of children: 3  . Years of education: Not on file  . Highest education level: Not on file  Occupational History  . Occupation: retired   Scientific laboratory technician  . Financial resource strain: Not hard at all  . Food insecurity    Worry: Never true    Inability: Never true  .  Transportation needs    Medical: No    Non-medical: No  Tobacco Use  . Smoking status: Former Smoker    Packs/day: 1.00    Years: 7.00    Pack years: 7.00    Types: Cigarettes  . Smokeless tobacco: Never Used  . Tobacco comment: smoked cigarettes age [age], up to 1ppd  Substance and Sexual Activity  . Alcohol use: Yes    Alcohol/week: 2.0 standard drinks    Types: 1 Glasses of wine, 1 Cans of beer per week    Comment: less than 1 drink per week  . Drug use: No  . Sexual activity: Yes  Lifestyle  . Physical activity    Days per week: 4 days    Minutes per session: 50 min  . Stress: Not at all  Relationships  . Social connections    Talks on phone: More than three times a week    Gets together: More than three times a week    Attends religious service: More than 4 times per year    Active member of club or organization: Yes    Attends meetings of clubs or organizations: More than 4 times per year    Relationship status: Married  Other Topics Concern  . Not on file  Social History Narrative  . Not on file     Observations/Objective: Appears well in NAD Breathing normally Normal mood and affect  Assessment and Plan:  See Problem List for Assessment and Plan of chronic medical problems.   Follow Up Instructions:  We will hold off on blood work since this is a virtual visit and he had his kidney function checked last month.    I discussed the assessment and treatment plan with the patient. The patient was provided an opportunity to ask questions and all were answered. The patient agreed with the plan and demonstrated an understanding of the instructions.   The patient was advised to call back or seek an in-person evaluation if the symptoms worsen or if the condition fails to improve as anticipated.  FU in 77months  Binnie Rail, MD

## 2019-01-17 ENCOUNTER — Ambulatory Visit (INDEPENDENT_AMBULATORY_CARE_PROVIDER_SITE_OTHER): Payer: Medicare Other | Admitting: Internal Medicine

## 2019-01-17 ENCOUNTER — Encounter: Payer: Self-pay | Admitting: Internal Medicine

## 2019-01-17 DIAGNOSIS — I4891 Unspecified atrial fibrillation: Secondary | ICD-10-CM

## 2019-01-17 DIAGNOSIS — I10 Essential (primary) hypertension: Secondary | ICD-10-CM | POA: Diagnosis not present

## 2019-01-17 DIAGNOSIS — B002 Herpesviral gingivostomatitis and pharyngotonsillitis: Secondary | ICD-10-CM

## 2019-01-17 DIAGNOSIS — R7303 Prediabetes: Secondary | ICD-10-CM | POA: Diagnosis not present

## 2019-01-17 DIAGNOSIS — E782 Mixed hyperlipidemia: Secondary | ICD-10-CM

## 2019-01-17 MED ORDER — ACYCLOVIR 200 MG PO CAPS: ORAL_CAPSULE | ORAL | 1 refills | Status: DC

## 2019-01-17 MED ORDER — AMLODIPINE BESYLATE 5 MG PO TABS: ORAL_TABLET | ORAL | 1 refills | Status: DC

## 2019-01-17 NOTE — Assessment & Plan Note (Signed)
Following with cardiology Q 6 months No concerning symptoms Continue current medications

## 2019-01-17 NOTE — Assessment & Plan Note (Signed)
Taking acyclovir daily continue

## 2019-01-17 NOTE — Assessment & Plan Note (Signed)
Low sugar / carb diet Stressed regular exercise    

## 2019-01-17 NOTE — Assessment & Plan Note (Signed)
bp this am at home  131/79 BP well controlled Current regimen effective and well tolerated Continue current medications at current doses

## 2019-01-17 NOTE — Assessment & Plan Note (Signed)
Continue statin. 

## 2019-01-20 ENCOUNTER — Telehealth: Payer: Self-pay | Admitting: Internal Medicine

## 2019-01-20 NOTE — Telephone Encounter (Signed)
-----   Message from Binnie Rail, MD sent at 01/17/2019 10:00 AM EDT ----- He needs a f/u with me in 6 months - prediabetes, htn.  Thnx.

## 2019-01-20 NOTE — Telephone Encounter (Signed)
Left message for patient to call back to schedule.  °

## 2019-02-16 ENCOUNTER — Other Ambulatory Visit: Payer: Self-pay | Admitting: Internal Medicine

## 2019-02-24 ENCOUNTER — Telehealth: Payer: Self-pay | Admitting: Internal Medicine

## 2019-02-24 NOTE — Telephone Encounter (Signed)
Left message for pt to call back to schedule a nurse visit.

## 2019-02-24 NOTE — Telephone Encounter (Signed)
Daniel Tucker, would recommend a tdap booster now.  Please have him come in for one

## 2019-02-24 NOTE — Telephone Encounter (Addendum)
Pt has half inch  laceration on his forehead he hit metal pole  and his wife is a nurse she cleaned the area and pt has ice pack on his forehead now. Pt last tetanus 11/08/2009 and wife would like to know if her should get another tetanus before 2021

## 2019-02-25 ENCOUNTER — Ambulatory Visit (INDEPENDENT_AMBULATORY_CARE_PROVIDER_SITE_OTHER): Payer: Medicare Other

## 2019-02-25 DIAGNOSIS — Z23 Encounter for immunization: Secondary | ICD-10-CM | POA: Diagnosis not present

## 2019-02-25 DIAGNOSIS — Z299 Encounter for prophylactic measures, unspecified: Secondary | ICD-10-CM

## 2019-03-01 ENCOUNTER — Other Ambulatory Visit (HOSPITAL_COMMUNITY): Payer: Self-pay | Admitting: Nurse Practitioner

## 2019-05-18 ENCOUNTER — Encounter: Payer: Self-pay | Admitting: Internal Medicine

## 2019-05-18 ENCOUNTER — Ambulatory Visit (INDEPENDENT_AMBULATORY_CARE_PROVIDER_SITE_OTHER): Payer: Medicare Other | Admitting: Internal Medicine

## 2019-05-18 ENCOUNTER — Other Ambulatory Visit: Payer: Self-pay

## 2019-05-18 DIAGNOSIS — H0015 Chalazion left lower eyelid: Secondary | ICD-10-CM

## 2019-05-18 DIAGNOSIS — I10 Essential (primary) hypertension: Secondary | ICD-10-CM | POA: Diagnosis not present

## 2019-05-18 MED ORDER — AMOXICILLIN-POT CLAVULANATE 875-125 MG PO TABS: 1.0000 | ORAL_TABLET | Freq: Two times a day (BID) | ORAL | 0 refills | Status: DC

## 2019-05-18 MED ORDER — ERYTHROMYCIN 5 MG/GM OP OINT: 1.0000 "application " | TOPICAL_OINTMENT | Freq: Every day | OPHTHALMIC | 0 refills | Status: DC

## 2019-05-18 NOTE — Assessment & Plan Note (Signed)
Chronic Blood pressure slightly elevated here today, but he states it is well controlled at home Continue current medications at current doses

## 2019-05-18 NOTE — Patient Instructions (Addendum)
Take the oral antibiotic and use the topical antibiotic as prescribed.  Use the warm compresses.   Please call if there is no improvement in your symptoms.

## 2019-05-18 NOTE — Assessment & Plan Note (Signed)
Acute Exam consistent with chalazion, which she has had in the past Augmentin twice daily x10 days Erythromycin ointment topically at bedtime He will let me know if this does not completely resolve in which case he will need to see his ophthalmologist

## 2019-05-18 NOTE — Progress Notes (Signed)
Subjective:    Patient ID: Daniel Tucker, male    DOB: March 06, 1941, 79 y.o.   MRN: [id number]  HPI The patient is here for an acute visit.  BP in 120's / 80's at home.    He has a stye in his left lower eyelid.  Last week he was clearing brush out in the yard and burning it.  He wonders if that may have been partially to blame.  He noticed the bump 3 days ago and it has gotten more inflamed since he first noticed it.  It did look like initially there was a scratch in his lower eyelid.  It is sore and swollen.  He denies any discharge from the eyelid.  He denies any conjunctival redness, eye pain or changes in vision.      Medications and allergies reviewed with patient and updated if appropriate.  Patient Active Problem List   Diagnosis Date Noted  . Hordeolum externum of right lower eyelid 12/03/2018  . History of right hip replacement 06/21/2018  . Acute cystitis without hematuria 05/31/2018  . OA (osteoarthritis) of hip 05/17/2018  . Pre-op examination 05/11/2018  . Hives 10/02/2017  . Prediabetes 09/05/2015  . 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 Arthritis of right hip 04/02/2015  . Atrial fibrillation ([place]) 08/24/2014  . Obstructive sleep apnea 07/28/2012  . Nonspecific abnormal electrocardiogram (ECG) (EKG) 05/15/2011  . Hyperlipidemia 10/10/2008  . Essential hypertension 10/10/2008  . History of colonic polyps 10/10/2008  . 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 acetaminophen (TYLENOL) 500 MG tablet Take 500 mg by mouth every 6 (six) hours as needed for moderate pain or headache.     Marland Kitchen acyclovir (ZOVIRAX) 200 MG capsule TAKE 2 CAPSULES EVERY DAY 180 capsule 1  . amLODipine (NORVASC) 5 MG tablet TAKE 1 AND 1/2 TABLETS EVERY DAY 135 tablet 1  . Ascorbic Acid (VITAMIN C) 1000 MG tablet Take 1,000 mg by mouth daily.      Marland Kitchen docusate sodium (COLACE) 100 MG  capsule Take 100 mg daily by mouth.    . dofetilide (TIKOSYN) 500 MCG capsule TAKE 1 CAPSULE TWICE DAILY 180 capsule 2  . ELIQUIS 5 MG TABS tablet TAKE 1 TABLET TWICE DAILY 180 tablet 3  . GLUCOSAMINE SULFATE PO Take 2 tablets by mouth daily.     . metoprolol tartrate (LOPRESSOR) 25 MG tablet TAKE 1/2 TABLET TWICE DAILY 90 tablet 3  . Multiple Vitamin (MULTIVITAMIN) tablet Take 1 tablet by mouth daily.     . Multiple Vitamins-Minerals (PRESERVISION AREDS 2 PO) Take 1 capsule by mouth 2 (two) times daily.     . potassium chloride SA (K-DUR) 20 MEQ tablet TAKE 1 TABLET FOUR TIMES DAILY 360 tablet 3  . pravastatin (PRAVACHOL) 20 MG tablet TAKE 1 TABLET EVERY DAY.  NEED OFFICE VISIT  FOR MORE REFILLS. 90 tablet 1   No current facility-administered medications on file prior to visit.    Past Medical History:  Diagnosis Date  . Arthritis    "hips" (11/20/2014)  . Benign essential HTN   . Fasting hyperglycemia 2006   FBS 111; normal A1c  . Hepatitis ~ 1955   "yellow juandice"  . Hyperlipidemia   . Internal hemorrhoids   . Persistent atrial fibrillation ([place]) 08/24/2014   CHASD2VASC score is 3 - on Apixaban  . Sleep apnea    intolerant to CPAP and  now using oral device followed by Dr. Toy Cookey  . Tubular adenoma of colon 2006   Dr Fuller Plan    Past Surgical History:  Procedure Laterality Date  . CARDIOVERSION N/A 10/12/2014   Procedure: CARDIOVERSION;  Surgeon: Darlin Coco, MD;  Location: [facility] ENDOSCOPY;  Service: Cardiovascular;  Laterality: N/A;  . COLONOSCOPY  2011   no polyp  . COLONOSCOPY W/ POLYPECTOMY  2006   [place]  . NASAL SINUS SURGERY  1997  . PROSTATE BIOPSY  2010   Negative,Dr.Ottelin  . TONSILLECTOMY AND ADENOIDECTOMY  1997  . 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 TOTAL HIP ARTHROPLASTY Right 05/17/2018   Procedure: TOTAL HIP ARTHROPLASTY ANTERIOR APPROACH;  Surgeon: Gaynelle Arabian, MD;  Location: [facility] ORS;  Service: Orthopedics;  Laterality: Right;  .  UVULOPALATOPHARYNGOPLASTY  1997   Dr.Woliki    Social History   Socioeconomic History  . Marital status: Married    Spouse name: Not on file  . Number of children: 3  . Years of education: Not on file  . Highest education level: Not on file  Occupational History  . Occupation: retired   Tobacco Use  . Smoking status: Former Smoker    Packs/day: 1.00    Years: 7.00    Pack years: 7.00    Types: Cigarettes  . Smokeless tobacco: Never Used  . Tobacco comment: smoked cigarettes age [age], up to 1ppd  Substance and Sexual Activity  . Alcohol use: Yes    Alcohol/week: 2.0 standard drinks    Types: 1 Glasses of wine, 1 Cans of beer per week    Comment: less than 1 drink per week  . Drug use: No  . Sexual activity: Yes  Other Topics Concern  . Not on file  Social History Narrative  . Not on file   Social Determinants of Health   Financial Resource Strain:   . Difficulty of Paying Living Expenses: Not on file  Food Insecurity:   . Worried About Charity fundraiser in the Last Year: Not on file  . Ran Out of Food in the Last Year: Not on file  Transportation Needs:   . Lack of Transportation (Medical): Not on file  . Lack of Transportation (Non-Medical): Not on file  Physical Activity:   . Days of Exercise per Week: Not on file  . Minutes of Exercise per Session: Not on file  Stress:   . Feeling of Stress : Not on file  Social Connections:   . Frequency of Communication with Friends and Family: Not on file  . Frequency of Social Gatherings with Friends and Family: Not on file  . Attends Religious Services: Not on file  . Active Member of Clubs or Organizations: Not on file  . Attends Archivist Meetings: Not on file  . Marital Status: Not on file    Family History  Problem Relation Age of Onset  . Prostate cancer Father   . Hypertension Father   . Hypertension Mother   . Prostate cancer Brother   . Melanoma Sister   . Atrial fibrillation Brother   .  Melanoma Brother   . Lupus Sister   . Diabetes Neg Hx   . Heart disease Neg Hx   . Stroke Neg Hx   . Colon cancer Neg Hx     Review of Systems  Constitutional: Negative for chills and fever.  Eyes: Negative for pain, redness and visual disturbance.  Skin: Positive for color change.  Left lower eyelid swelling/bump       Objective:   Vitals:   05/18/19 1042  BP: (!) 146/86  Pulse: (!) 56  Resp: 16  Temp: 98.3 F (36.8 C)  SpO2: 99%   BP Readings from Last 3 Encounters:  05/18/19 (!) 146/86  12/16/18 (!) 150/70  12/03/18 (!) 150/80   Wt Readings from Last 3 Encounters:  05/18/19 189 lb (85.7 kg)  12/16/18 184 lb 12.8 oz (83.8 kg)  12/03/18 186 lb (84.4 kg)   Body mass index is 29.6 kg/m.   Physical Exam Constitutional:      General: He is not in acute distress.    Appearance: Normal appearance. He is not ill-appearing.  HENT:     Head: Normocephalic and atraumatic.  Eyes:     General:        Left eye: No discharge.     Conjunctiva/sclera: Conjunctivae normal.     Comments: Left lower eyelid chalazion, soft, slightly tender to palpation  Neurological:     Mental Status: He is alert.            Assessment & Plan:    See Problem List for Assessment and Plan of chronic medical problems.    This visit occurred during the SARS-CoV-2 public health emergency.  Safety protocols were in place, including screening questions prior to the visit, additional usage of staff PPE, and extensive cleaning of exam room while observing appropriate contact time as indicated for disinfecting solutions.

## 2019-05-19 ENCOUNTER — Ambulatory Visit: Payer: Medicare Other

## 2019-05-25 ENCOUNTER — Other Ambulatory Visit: Payer: Self-pay | Admitting: Internal Medicine

## 2019-05-27 ENCOUNTER — Ambulatory Visit: Payer: Medicare Other | Attending: Internal Medicine

## 2019-05-27 DIAGNOSIS — Z23 Encounter for immunization: Secondary | ICD-10-CM

## 2019-05-27 NOTE — Progress Notes (Signed)
   Covid-19 Vaccination Clinic  Name:  Daniel Tucker    MRN: [id number] DOB: 04-08-1941  05/27/2019  Daniel Tucker was observed post Covid-19 immunization for 15 minutes without incidence. He was provided with Vaccine Information Sheet and instruction to access the V-Safe system.   Daniel Tucker was instructed to call 911 with any severe reactions post vaccine: Marland Kitchen Difficulty breathing  . Swelling of your face and throat  . A fast heartbeat  . A bad rash all over your body  . Dizziness and weakness    Immunizations Administered    Name Date Dose VIS Date Route   Pfizer COVID-19 Vaccine 05/27/2019  4:28 PM 0.3 mL 04/01/2019 Intramuscular   Manufacturer: [place]   Lot: [id number]   [place]: [id number]

## 2019-06-17 ENCOUNTER — Encounter (HOSPITAL_COMMUNITY): Payer: Self-pay | Admitting: Nurse Practitioner

## 2019-06-17 ENCOUNTER — Ambulatory Visit (HOSPITAL_COMMUNITY)
Admission: RE | Admit: 2019-06-17 | Discharge: 2019-06-17 | Disposition: A | Discharge status: Home / Self Care | Payer: Medicare Other | Source: Ambulatory Visit | Attending: Nurse Practitioner | Admitting: Nurse Practitioner

## 2019-06-17 ENCOUNTER — Other Ambulatory Visit: Payer: Self-pay

## 2019-06-17 VITALS — BP 136/84 | HR 59 | Ht 67.0 in | Wt 185.2 lb

## 2019-06-17 DIAGNOSIS — Z79899 Other long term (current) drug therapy: Secondary | ICD-10-CM | POA: Insufficient documentation

## 2019-06-17 DIAGNOSIS — I10 Essential (primary) hypertension: Secondary | ICD-10-CM | POA: Diagnosis not present

## 2019-06-17 DIAGNOSIS — I4891 Unspecified atrial fibrillation: Secondary | ICD-10-CM | POA: Insufficient documentation

## 2019-06-17 DIAGNOSIS — D6869 Other thrombophilia: Secondary | ICD-10-CM

## 2019-06-17 DIAGNOSIS — G4733 Obstructive sleep apnea (adult) (pediatric): Secondary | ICD-10-CM | POA: Insufficient documentation

## 2019-06-17 DIAGNOSIS — Z7901 Long term (current) use of anticoagulants: Secondary | ICD-10-CM | POA: Diagnosis not present

## 2019-06-17 DIAGNOSIS — I480 Paroxysmal atrial fibrillation: Secondary | ICD-10-CM

## 2019-06-17 DIAGNOSIS — Z87891 Personal history of nicotine dependence: Secondary | ICD-10-CM | POA: Insufficient documentation

## 2019-06-17 DIAGNOSIS — Z8249 Family history of ischemic heart disease and other diseases of the circulatory system: Secondary | ICD-10-CM | POA: Insufficient documentation

## 2019-06-17 DIAGNOSIS — E785 Hyperlipidemia, unspecified: Secondary | ICD-10-CM | POA: Diagnosis not present

## 2019-06-17 LAB — CBC
HCT: 46.1 % (ref 39.0–52.0)
Hemoglobin: 14.8 g/dL (ref 13.0–17.0)
MCH: 31.8 pg (ref 26.0–34.0)
MCHC: 32.1 g/dL (ref 30.0–36.0)
MCV: 99.1 fL (ref 80.0–100.0)
Platelets: 216 10*3/uL (ref 150–400)
RBC: 4.65 MIL/uL (ref 4.22–5.81)
RDW: 12.2 % (ref 11.5–15.5)
WBC: 5.2 10*3/uL (ref 4.0–10.5)
nRBC: 0 % (ref 0.0–0.2)

## 2019-06-17 LAB — BASIC METABOLIC PANEL
Anion gap: 10 (ref 5–15)
BUN: 19 mg/dL (ref 8–23)
CO2: 24 mmol/L (ref 22–32)
Calcium: 9 mg/dL (ref 8.9–10.3)
Chloride: 103 mmol/L (ref 98–111)
Creatinine, Ser: 1.1 mg/dL (ref 0.61–1.24)
GFR calc Af Amer: >60 mL/min (ref >60)
GFR calc non Af Amer: >60 mL/min (ref >60)
Glucose, Bld: 147 mg/dL — ABNORMAL HIGH (ref 70–99)
Potassium: 4.4 mmol/L (ref 3.5–5.1)
Sodium: 137 mmol/L (ref 135–145)

## 2019-06-17 LAB — MAGNESIUM: Magnesium: 2.1 mg/dL (ref 1.7–2.4)

## 2019-06-17 MED ORDER — POTASSIUM CHLORIDE CRYS ER 20 MEQ PO TBCR: EXTENDED_RELEASE_TABLET | ORAL | Status: DC

## 2019-06-17 NOTE — Progress Notes (Signed)
Patient ID: Daniel Tucker, male   DOB: 02-10-1941, 79 y.o.   MRN: [id number]     Primary Care Physician: Binnie Rail, MD Referring Physician: Dr. Jovita Kussmaul is a 79 y.o. male with a h/o paroxysmal afib maintaining SR on tikosyn. He is in the afib clinic for f/u surveillance of Tikosyn. He did go thru a hip replacement in February 2020 without any issues. Short episode  afib lst week that responded well to extra dose of BB. Pending 2nd covid shot 3/3. Qtc is stable.   Today, he denies symptoms of palpitations, chest pain, shortness of breath, orthopnea, PND, lower extremity edema, dizziness, presyncope, syncope, or neurologic sequela. The patient is tolerating medications without difficulties and is otherwise without complaint today.   Past Medical History:  Diagnosis Date  . Arthritis    "hips" (11/20/2014)  . Benign essential HTN   . Fasting hyperglycemia 2006   FBS 111; normal A1c  . Hepatitis ~ 1955   "yellow juandice"  . Hyperlipidemia   . Internal hemorrhoids   . Persistent atrial fibrillation ([place]) 08/24/2014   CHASD2VASC score is 3 - on Apixaban  . Sleep apnea    intolerant to CPAP and now using oral device followed by Dr. Toy Cookey  . Tubular adenoma of colon 2006   Dr Fuller Plan   Past Surgical History:  Procedure Laterality Date  . CARDIOVERSION N/A 10/12/2014   Procedure: CARDIOVERSION;  Surgeon: Darlin Coco, MD;  Location[facility] ENDOSCOPY;  Service: Cardiovascular;  Laterality: N/A;  . COLONOSCOPY  2011   no polyp  . COLONOSCOPY W/ POLYPECTOMY  2006   [place]  . NASAL SINUS SURGERY  1997  . PROSTATE BIOPSY  2010   Negative,Dr.Ottelin  . TONSILLECTOMY AND ADENOIDECTOMY  1997  . 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 TOTAL HIP ARTHROPLASTY Right 05/17/2018   Procedure: TOTAL HIP ARTHROPLASTY ANTERIOR APPROACH;  Surgeon: Gaynelle Arabian, MD;  Location: [facility] ORS;  Service: Orthopedics;  Laterality: Right;  . UVULOPALATOPHARYNGOPLASTY  1997    Dr.Woliki    Current Outpatient Medications  Medication Sig Dispense Refill  . acetaminophen (TYLENOL) 500 MG tablet Take 500 mg by mouth every 6 (six) hours as needed for moderate pain or headache.     Marland Kitchen acyclovir (ZOVIRAX) 200 MG capsule TAKE 2 CAPSULES EVERY DAY 180 capsule 1  . amLODipine (NORVASC) 5 MG tablet TAKE 1 AND 1/2 TABLETS EVERY DAY 135 tablet 1  . amoxicillin (AMOXIL) 500 MG capsule amoxicillin 500 mg capsule  TAKE 4 CAPSULES BY MOUTH 1 HOUR PRIOR TO DENTAL PROCEDURE    . Ascorbic Acid (VITAMIN C) 1000 MG tablet Take 1,000 mg by mouth daily.      Marland Kitchen docusate sodium (COLACE) 100 MG capsule Take 100 mg by mouth 2 (two) times daily.     Marland Kitchen dofetilide (TIKOSYN) 500 MCG capsule TAKE 1 CAPSULE TWICE DAILY 180 capsule 2  . ELIQUIS 5 MG TABS tablet TAKE 1 TABLET TWICE DAILY 180 tablet 3  . erythromycin ophthalmic ointment Place 1 application into the left eye at bedtime. 3.5 g 0  . GLUCOSAMINE SULFATE PO Take 2 tablets by mouth daily.     . metoprolol tartrate (LOPRESSOR) 25 MG tablet TAKE 1/2 TABLET TWICE DAILY 90 tablet 3  . Multiple Vitamin (MULTIVITAMIN) tablet Take 1 tablet by mouth daily.     . Multiple Vitamins-Minerals (PRESERVISION AREDS 2 PO) Take 1 capsule by mouth 2 (two) times daily.     Marland Kitchen  potassium chloride SA (K-DUR) 20 MEQ tablet TAKE 1 TABLET FOUR TIMES DAILY (Patient taking differently: Taking two tablets by mouth twice daily) 360 tablet 3  . pravastatin (PRAVACHOL) 20 MG tablet TAKE 1 TABLET EVERY DAY.  NEED OFFICE VISIT  FOR MORE REFILLS. 90 tablet 1   No current facility-administered medications for this encounter.    No Known Allergies  Social History   Socioeconomic History  . Marital status: Married    Spouse name: Not on file  . Number of children: 3  . Years of education: Not on file  . Highest education level: Not on file  Occupational History  . Occupation: retired   Tobacco Use  . Smoking status: Former Smoker    Packs/day: 1.00    Years: 7.00     Pack years: 7.00    Types: Cigarettes  . Smokeless tobacco: Never Used  . Tobacco comment: smoked cigarettes age [age], up to 1ppd  Substance and Sexual Activity  . Alcohol use: Yes    Alcohol/week: 2.0 standard drinks    Types: 1 Glasses of wine, 1 Cans of beer per week    Comment: less than 1 drink per week  . Drug use: No  . Sexual activity: Yes  Other Topics Concern  . Not on file  Social History Narrative  . Not on file   Social Determinants of Health   Financial Resource Strain:   . Difficulty of Paying Living Expenses: Not on file  Food Insecurity:   . Worried About Charity fundraiser in the Last Year: Not on file  . Ran Out of Food in the Last Year: Not on file  Transportation Needs:   . Lack of Transportation (Medical): Not on file  . Lack of Transportation (Non-Medical): Not on file  Physical Activity:   . Days of Exercise per Week: Not on file  . Minutes of Exercise per Session: Not on file  Stress:   . Feeling of Stress : Not on file  Social Connections:   . Frequency of Communication with Friends and Family: Not on file  . Frequency of Social Gatherings with Friends and Family: Not on file  . Attends Religious Services: Not on file  . Active Member of Clubs or Organizations: Not on file  . Attends Archivist Meetings: Not on file  . Marital Status: Not on file  Intimate Partner Violence:   . Fear of Current or Ex-Partner: Not on file  . Emotionally Abused: Not on file  . Physically Abused: Not on file  . Sexually Abused: Not on file    Family History  Problem Relation Age of Onset  . Prostate cancer Father   . Hypertension Father   . Hypertension Mother   . Prostate cancer Brother   . Melanoma Sister   . Atrial fibrillation Brother   . Melanoma Brother   . Lupus Sister   . Diabetes Neg Hx   . Heart disease Neg Hx   . Stroke Neg Hx   . Colon cancer Neg Hx     ROS- All systems are reviewed and negative except as per the HPI  above  Physical Exam: Vitals:   06/17/19 1001  BP: 136/84  Pulse: (!) 59  Weight: 84 kg  Height: 5' 7"  (1.702 m)    GEN- The patient is well appearing, alert and oriented x 3 today.   Head- normocephalic, atraumatic Eyes-  Sclera clear, conjunctiva pink Ears- hearing intact Oropharynx- clear Neck- supple, no  JVP Lymph- no cervical lymphadenopathy Lungs- Clear to ausculation bilaterally, normal work of breathing Heart- regular rate and rhythm  no murmurs, rubs or gallops, PMI not laterally displaced GI- soft, NT, ND, + BS Extremities- no clubbing, cyanosis, or edema MS- no significant deformity or atrophy Skin- no rash or lesion Psych- euthymic mood, full affect Neuro- strength and sensation are intact  EKG- sinus brady at 59 bpm, pr int 156 ms, qrs int 138 ms, qtc 461 ms Epic records reviewed.  Assessment and Plan: 1. Afib  Low afib burden Continue on tikosyn 500 mg bid  qtc stable Continue apixaban, no bleeding issues Bmet/mag/cbc today  2.OSA Still wearing mouthpiece  3. HTN  Stable Avoid salt  4. Asymptomatic brady Improved with reduction of metoprolol   F/u in 6 months    Butch Penny C. Xylah Early, [facility] [address] [place], [place] [address] [phone]

## 2019-06-17 NOTE — Addendum Note (Signed)
Encounter addended by: Enid Derry, CMA on: 06/17/2019 11:52 AM  Actions taken: Order list changed

## 2019-06-22 ENCOUNTER — Ambulatory Visit: Payer: Medicare Other | Attending: Internal Medicine

## 2019-06-22 ENCOUNTER — Other Ambulatory Visit: Payer: Self-pay | Admitting: Internal Medicine

## 2019-06-22 DIAGNOSIS — Z23 Encounter for immunization: Secondary | ICD-10-CM | POA: Insufficient documentation

## 2019-06-22 NOTE — Progress Notes (Signed)
   Covid-19 Vaccination Clinic  Name:  FRED ADJEI    MRN: [id number] DOB: 1941/01/29  06/22/2019  Mr. Nevel was observed post Covid-19 immunization for 15 minutes without incident. He was provided with Vaccine Information Sheet and instruction to access the V-Safe system.   Mr. Tomson was instructed to call 911 with any severe reactions post vaccine: Marland Kitchen Difficulty breathing  . Swelling of face and throat  . A fast heartbeat  . A bad rash all over body  . Dizziness and weakness   Immunizations Administered    Name Date Dose VIS Date Route   Pfizer COVID-19 Vaccine 06/22/2019 11:52 AM 0.3 mL 04/01/2019 Intramuscular   Manufacturer: [place]   Lot: [id number]   [place]: [id number]

## 2019-07-10 NOTE — Progress Notes (Signed)
Subjective:    Patient ID: Daniel Tucker, male    DOB: Jun 18, 1940, 79 y.o.   MRN: [id number]  HPI The patient is here for follow up of their chronic medical problems, including Afib, hypertension, hyperlipidemia, prediabetes, oral herpes simplex.  He is taking all of his medications as prescribed.    He is exercising regularly.      BP at home better controlled 113/61 last night.  Usually he is high 120-130's/70-80.  Overall he feels well and has no concerns.   Medications and allergies reviewed with patient and updated if appropriate.  Patient Active Problem List   Diagnosis Date Noted  . Chalazion left lower eyelid 05/18/2019  . Hordeolum externum of right lower eyelid 12/03/2018  . History of right hip replacement 06/21/2018  . Acute cystitis without hematuria 05/31/2018  . OA (osteoarthritis) of hip 05/17/2018  . Pre-op examination 05/11/2018  . Hives 10/02/2017  . Prediabetes 09/05/2015  . 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 Arthritis of right hip 04/02/2015  . Atrial fibrillation ([place]) 08/24/2014  . Obstructive sleep apnea 07/28/2012  . Nonspecific abnormal electrocardiogram (ECG) (EKG) 05/15/2011  . Hyperlipidemia 10/10/2008  . Essential hypertension 10/10/2008  . History of colonic polyps 10/10/2008  . 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 acetaminophen (TYLENOL) 500 MG tablet Take 500 mg by mouth every 6 (six) hours as needed for moderate pain or headache.     Marland Kitchen acyclovir (ZOVIRAX) 200 MG capsule TAKE 2 CAPSULES EVERY DAY 180 capsule 1  . amLODipine (NORVASC) 5 MG tablet TAKE 1 AND 1/2 TABLETS EVERY DAY 135 tablet 1  . amoxicillin (AMOXIL) 500 MG capsule amoxicillin 500 mg capsule  TAKE 4 CAPSULES BY MOUTH 1 HOUR PRIOR TO DENTAL PROCEDURE    . Ascorbic Acid (VITAMIN C) 1000 MG tablet Take 1,000 mg by mouth daily.      . Cholecalciferol  (VITAMIN D) 50 MCG (2000 UT) tablet Take 2,000 Units by mouth daily.    Marland Kitchen docusate sodium (COLACE) 100 MG capsule Take 100 mg by mouth 2 (two) times daily.     Marland Kitchen dofetilide (TIKOSYN) 500 MCG capsule TAKE 1 CAPSULE TWICE DAILY 180 capsule 2  . ELIQUIS 5 MG TABS tablet TAKE 1 TABLET TWICE DAILY 180 tablet 3  . erythromycin ophthalmic ointment Place 1 application into the left eye at bedtime. 3.5 g 0  . GLUCOSAMINE SULFATE PO Take 2 tablets by mouth daily.     . metoprolol tartrate (LOPRESSOR) 25 MG tablet TAKE 1/2 TABLET TWICE DAILY 90 tablet 3  . Multiple Vitamin (MULTIVITAMIN) tablet Take 1 tablet by mouth daily.     . Multiple Vitamins-Minerals (PRESERVISION AREDS 2 PO) Take 1 capsule by mouth 2 (two) times daily.     . potassium chloride SA (KLOR-CON) 20 MEQ tablet Taking two tablets by mouth twice daily    . pravastatin (PRAVACHOL) 20 MG tablet TAKE 1 TABLET EVERY DAY 90 tablet 1   No current facility-administered medications on file prior to visit.    Past Medical History:  Diagnosis Date  . Arthritis    "hips" (11/20/2014)  . Benign essential HTN   . Fasting hyperglycemia 2006   FBS 111; normal A1c  . Hepatitis ~ 1955   "yellow juandice"  . Hyperlipidemia   . Internal hemorrhoids   . Persistent atrial fibrillation ([place]) 08/24/2014   CHASD2VASC score is 3 -  on Apixaban  . Sleep apnea    intolerant to CPAP and now using oral device followed by Dr. Toy Cookey  . Tubular adenoma of colon 2006   Dr Fuller Plan    Past Surgical History:  Procedure Laterality Date  . CARDIOVERSION N/A 10/12/2014   Procedure: CARDIOVERSION;  Surgeon: Darlin Coco, MD;  Location: [facility] ENDOSCOPY;  Service: Cardiovascular;  Laterality: N/A;  . COLONOSCOPY  2011   no polyp  . COLONOSCOPY W/ POLYPECTOMY  2006   [facility]  . NASAL SINUS SURGERY  1997  . PROSTATE BIOPSY  2010   Negative,Dr.Ottelin  . TONSILLECTOMY AND ADENOIDECTOMY  1997  . 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 TOTAL HIP  ARTHROPLASTY Right 05/17/2018   Procedure: TOTAL HIP ARTHROPLASTY ANTERIOR APPROACH;  Surgeon: Gaynelle Arabian, MD;  Location: [facility] ORS;  Service: Orthopedics;  Laterality: Right;  . UVULOPALATOPHARYNGOPLASTY  1997   Dr.Woliki    Social History   Socioeconomic History  . Marital status: Married    Spouse name: Not on file  . Number of children: 3  . Years of education: Not on file  . Highest education level: Not on file  Occupational History  . Occupation: retired   Tobacco Use  . Smoking status: Former Smoker    Packs/day: 1.00    Years: 7.00    Pack years: 7.00    Types: Cigarettes  . Smokeless tobacco: Never Used  . Tobacco comment: smoked cigarettes age [age], up to 1ppd  Substance and Sexual Activity  . Alcohol use: Yes    Alcohol/week: 2.0 standard drinks    Types: 1 Glasses of wine, 1 Cans of beer per week    Comment: less than 1 drink per week  . Drug use: No  . Sexual activity: Yes  Other Topics Concern  . Not on file  Social History Narrative  . Not on file   Social Determinants of Health   Financial Resource Strain:   . Difficulty of Paying Living Expenses:   Food Insecurity:   . Worried About Charity fundraiser in the Last Year:   . Arboriculturist in the Last Year:   Transportation Needs:   . Film/video editor (Medical):   Marland Kitchen Lack of Transportation (Non-Medical):   Physical Activity:   . Days of Exercise per Week:   . Minutes of Exercise per Session:   Stress:   . Feeling of Stress :   Social Connections:   . Frequency of Communication with Friends and Family:   . Frequency of Social Gatherings with Friends and Family:   . Attends Religious Services:   . Active Member of Clubs or Organizations:   . Attends Archivist Meetings:   Marland Kitchen Marital Status:     Family History  Problem Relation Age of Onset  . Prostate cancer Father   . Hypertension Father   . Hypertension Mother   . Prostate cancer Brother   . Melanoma Sister   . Atrial  fibrillation Brother   . Melanoma Brother   . Lupus Sister   . Diabetes Neg Hx   . Heart disease Neg Hx   . Stroke Neg Hx   . Colon cancer Neg Hx     Review of Systems  Constitutional: Negative for chills and fever.  Respiratory: Negative for cough, shortness of breath and wheezing.   Cardiovascular: Positive for palpitations (rare). Negative for chest pain and leg swelling.  Neurological: Negative for dizziness, light-headedness and  headaches.  Hematological: Does not bruise/bleed easily.       Objective:   Vitals:   07/11/19 0842  BP: (!) 144/86  Pulse: 84  Resp: 16  Temp: 98.2 F (36.8 C)  SpO2: 99%   BP Readings from Last 3 Encounters:  07/11/19 (!) 144/86  06/17/19 136/84  05/18/19 (!) 146/86   Wt Readings from Last 3 Encounters:  07/11/19 187 lb (84.8 kg)  06/17/19 185 lb 3.2 oz (84 kg)  05/18/19 189 lb (85.7 kg)   Body mass index is 29.29 kg/m.   Physical Exam    Constitutional: Appears well-developed and well-nourished. No distress.  HENT:  Head: Normocephalic and atraumatic.  Neck: Neck supple. No tracheal deviation present. No thyromegaly present.  No cervical lymphadenopathy Cardiovascular: Normal rate, regular rhythm and normal heart sounds.   No murmur heard. No carotid bruit .  No edema Pulmonary/Chest: Effort normal and breath sounds normal. No respiratory distress. No has no wheezes. No rales.  Skin: Skin is warm and dry. Not diaphoretic.  Psychiatric: Normal mood and affect. Behavior is normal.      Assessment & Plan:    See Problem List for Assessment and Plan of chronic medical problems.    This visit occurred during the SARS-CoV-2 public health emergency.  Safety protocols were in place, including screening questions prior to the visit, additional usage of staff PPE, and extensive cleaning of exam room while observing appropriate contact time as indicated for disinfecting solutions.

## 2019-07-10 NOTE — Patient Instructions (Addendum)
  Blood work was ordered.   ° ° °Medications reviewed and updated.  Changes include :   none ° ° ° °Please followup in 6 months ° ° °

## 2019-07-11 ENCOUNTER — Ambulatory Visit (INDEPENDENT_AMBULATORY_CARE_PROVIDER_SITE_OTHER): Payer: Medicare Other | Admitting: Internal Medicine

## 2019-07-11 ENCOUNTER — Other Ambulatory Visit: Payer: Self-pay

## 2019-07-11 ENCOUNTER — Encounter: Payer: Self-pay | Admitting: Internal Medicine

## 2019-07-11 VITALS — BP 144/86 | HR 84 | Temp 98.2°F | Resp 16 | Ht 67.0 in | Wt 187.0 lb

## 2019-07-11 DIAGNOSIS — I10 Essential (primary) hypertension: Secondary | ICD-10-CM | POA: Diagnosis not present

## 2019-07-11 DIAGNOSIS — R7303 Prediabetes: Secondary | ICD-10-CM | POA: Diagnosis not present

## 2019-07-11 DIAGNOSIS — I4891 Unspecified atrial fibrillation: Secondary | ICD-10-CM | POA: Diagnosis not present

## 2019-07-11 DIAGNOSIS — E782 Mixed hyperlipidemia: Secondary | ICD-10-CM | POA: Diagnosis not present

## 2019-07-11 DIAGNOSIS — B002 Herpesviral gingivostomatitis and pharyngotonsillitis: Secondary | ICD-10-CM | POA: Diagnosis not present

## 2019-07-11 LAB — LIPID PANEL
Cholesterol: 154 mg/dL (ref 0–200)
HDL: 48.9 mg/dL (ref >39.00)
LDL Cholesterol: 78 mg/dL (ref 0–99)
NonHDL: 105.24
Total CHOL/HDL Ratio: 3
Triglycerides: 134 mg/dL (ref 0.0–149.0)
VLDL: 26.8 mg/dL (ref 0.0–40.0)

## 2019-07-11 LAB — COMPREHENSIVE METABOLIC PANEL
ALT: 16 U/L (ref 0–53)
AST: 18 U/L (ref 0–37)
Albumin: 4.3 g/dL (ref 3.5–5.2)
Alkaline Phosphatase: 73 U/L (ref 39–117)
BUN: 18 mg/dL (ref 6–23)
CO2: 30 mEq/L (ref 19–32)
Calcium: 9.4 mg/dL (ref 8.4–10.5)
Chloride: 103 mEq/L (ref 96–112)
Creatinine, Ser: 1.03 mg/dL (ref 0.40–1.50)
GFR: 69.83 mL/min (ref >60.00)
Glucose, Bld: 111 mg/dL — ABNORMAL HIGH (ref 70–99)
Potassium: 4.3 mEq/L (ref 3.5–5.1)
Sodium: 139 mEq/L (ref 135–145)
Total Bilirubin: 0.7 mg/dL (ref 0.2–1.2)
Total Protein: 7.3 g/dL (ref 6.0–8.3)

## 2019-07-11 LAB — TSH: TSH: 1.08 u[IU]/mL (ref 0.35–4.50)

## 2019-07-11 LAB — HEMOGLOBIN A1C: Hgb A1c MFr Bld: 5.9 % (ref 4.6–6.5)

## 2019-07-11 NOTE — Assessment & Plan Note (Signed)
Chronic Check lipid panel  Continue daily statin Regular exercise and healthy diet encouraged  

## 2019-07-11 NOTE — Assessment & Plan Note (Signed)
Chronic Taking acyclovir daily Well-controlled Continue

## 2019-07-11 NOTE — Assessment & Plan Note (Signed)
Chronic Check a1c Low sugar / carb diet Stressed regular exercise  

## 2019-07-11 NOTE — Assessment & Plan Note (Addendum)
Chronic BP well controlled at home, slightly higher here Current regimen effective and well tolerated Continue current medications at current doses cmp

## 2019-07-11 NOTE — Assessment & Plan Note (Signed)
Chronic Following with cardiology every 6 months No concerning symptoms Taking Tikosyn, Eliquis, metoprolol TSH, CMP

## 2019-08-10 ENCOUNTER — Other Ambulatory Visit (HOSPITAL_COMMUNITY): Payer: Self-pay | Admitting: Nurse Practitioner

## 2019-09-25 IMAGING — DX DG PORTABLE PELVIS
1 series · 1 of 1 positions shown · non-contrast
Comparison: Radiograph September 02, 2010.

CLINICAL DATA: Status post right hip replacement.

EXAM:
PORTABLE PELVIS 1-2 VIEWS

[pelvis ap]
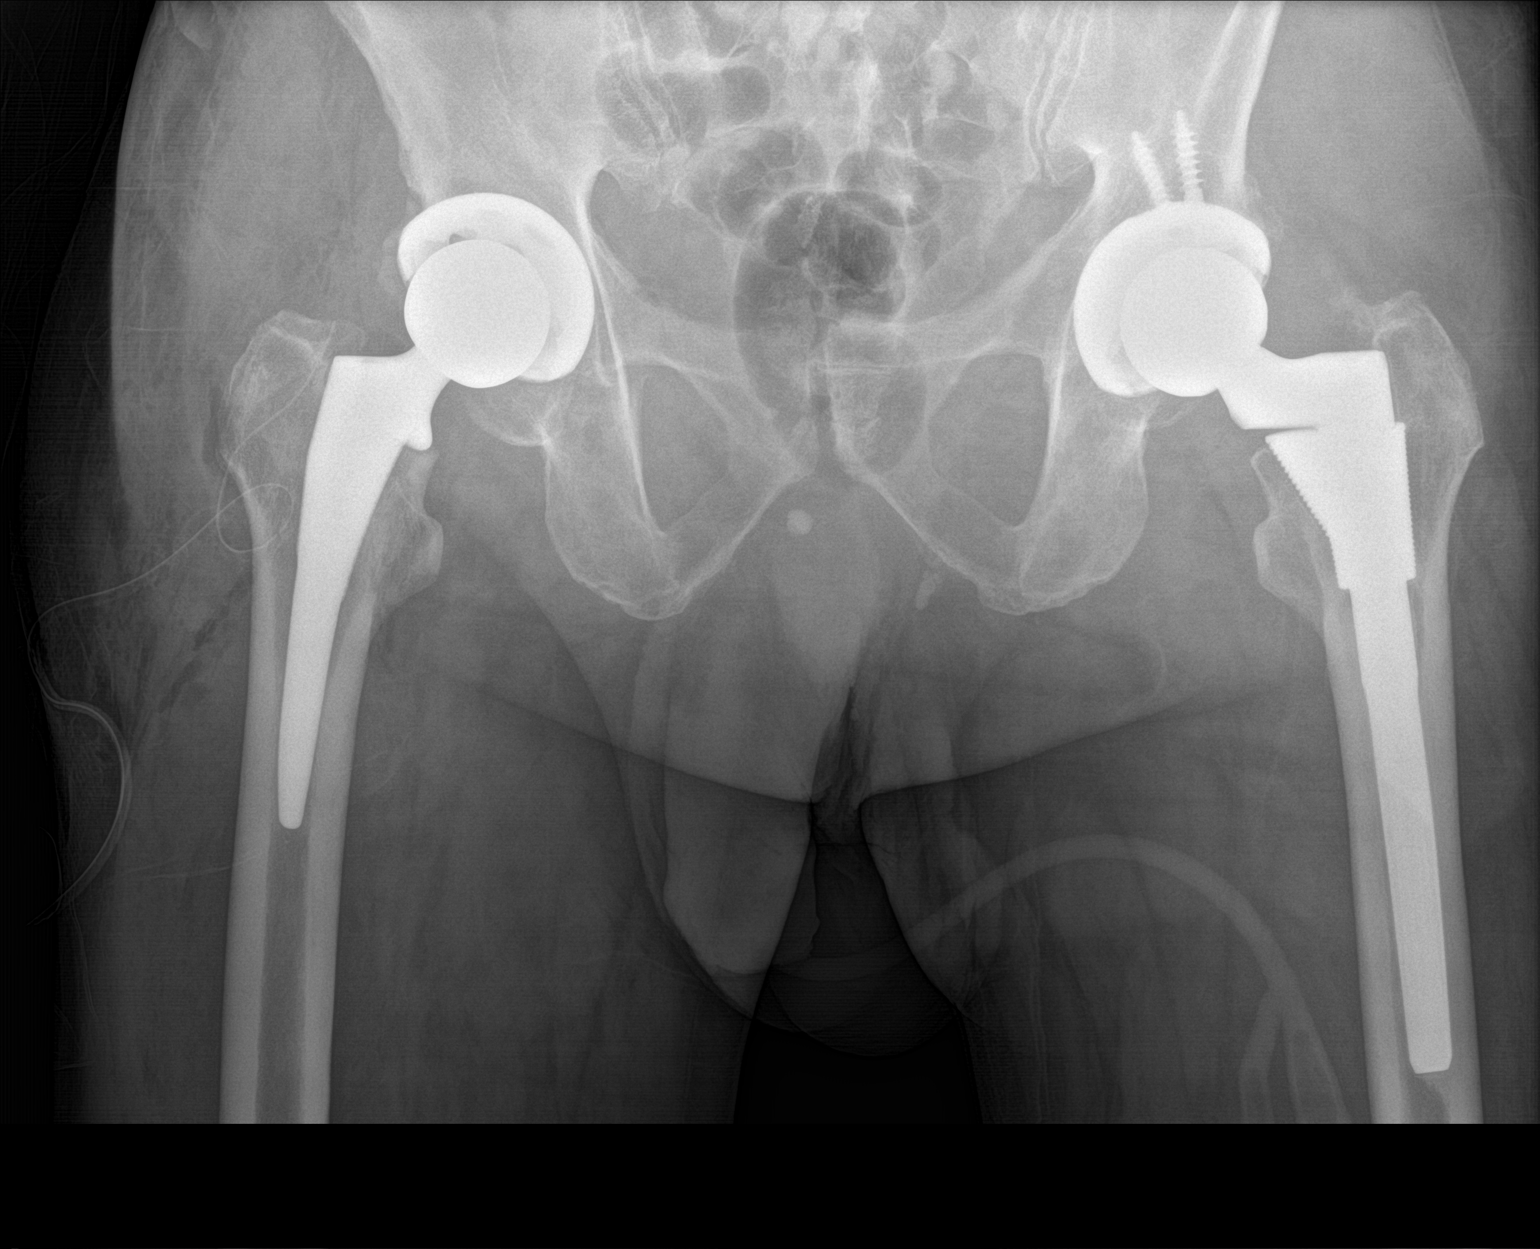

[1 of 1 positions shown; findings below may reference images not displayed]

FINDINGS: Interval placement of right total hip arthroplasty. The acetabular
and femoral components appear to be well situated. Surgical drain in
other postoperative findings are seen in the surrounding soft
tissues. Status post previous left hip arthroplasty.
IMPRESSION: Status post right total hip arthroplasty.

## 2019-09-28 ENCOUNTER — Other Ambulatory Visit (HOSPITAL_COMMUNITY): Payer: Self-pay | Admitting: Nurse Practitioner

## 2019-10-12 ENCOUNTER — Other Ambulatory Visit (HOSPITAL_COMMUNITY): Payer: Self-pay | Admitting: Nurse Practitioner

## 2019-10-12 DIAGNOSIS — I480 Paroxysmal atrial fibrillation: Secondary | ICD-10-CM

## 2019-10-24 ENCOUNTER — Other Ambulatory Visit (HOSPITAL_COMMUNITY): Payer: Self-pay | Admitting: Nurse Practitioner

## 2019-11-02 ENCOUNTER — Other Ambulatory Visit: Payer: Self-pay | Admitting: Internal Medicine

## 2019-11-10 ENCOUNTER — Other Ambulatory Visit: Payer: Self-pay | Admitting: Internal Medicine

## 2019-11-30 ENCOUNTER — Ambulatory Visit (INDEPENDENT_AMBULATORY_CARE_PROVIDER_SITE_OTHER): Payer: Medicare Other | Admitting: Family

## 2019-11-30 ENCOUNTER — Other Ambulatory Visit: Payer: Self-pay

## 2019-11-30 VITALS — BP 148/74 | HR 49 | Temp 98.1°F | Ht 67.0 in | Wt 185.0 lb

## 2019-11-30 DIAGNOSIS — H00011 Hordeolum externum right upper eyelid: Secondary | ICD-10-CM | POA: Diagnosis not present

## 2019-11-30 MED ORDER — TOBRAMYCIN 0.3 % OP SOLN: 1.0000 [drp] | Freq: Four times a day (QID) | OPHTHALMIC | 0 refills | Status: DC

## 2019-11-30 NOTE — Patient Instructions (Signed)

## 2019-11-30 NOTE — Progress Notes (Signed)
Daniel Tucker is a 79 y.o. male with the following history as recorded in EpicCare:  Patient Active Problem List   Diagnosis Date Noted  . Chalazion left lower eyelid 05/18/2019  . Hordeolum externum of right lower eyelid 12/03/2018  . History of right hip replacement 06/21/2018  . Acute cystitis without hematuria 05/31/2018  . OA (osteoarthritis) of hip 05/17/2018  . Pre-op examination 05/11/2018  . Hives 10/02/2017  . Prediabetes 09/05/2015  . 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 Arthritis of right hip 04/02/2015  . Atrial fibrillation ([place]) 08/24/2014  . Obstructive sleep apnea 07/28/2012  . Nonspecific abnormal electrocardiogram (ECG) (EKG) 05/15/2011  . Hyperlipidemia 10/10/2008  . Essential hypertension 10/10/2008  . History of colonic polyps 10/10/2008  . 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 acetaminophen (TYLENOL) 500 MG tablet Take 500 mg by mouth every 6 (six) hours as needed for moderate pain or headache.     Marland Kitchen acyclovir (ZOVIRAX) 200 MG capsule TAKE 2 CAPSULES EVERY DAY 180 capsule 1  . amoxicillin (AMOXIL) 500 MG capsule amoxicillin 500 mg capsule  TAKE 4 CAPSULES BY MOUTH 1 HOUR PRIOR TO DENTAL PROCEDURE    . Ascorbic Acid (VITAMIN C) 1000 MG tablet Take 1,000 mg by mouth daily.      . Cholecalciferol (VITAMIN D) 50 MCG (2000 UT) tablet Take 2,000 Units by mouth daily.    Marland Kitchen docusate sodium (COLACE) 100 MG capsule Take 100 mg by mouth 2 (two) times daily.     Marland Kitchen dofetilide (TIKOSYN) 500 MCG capsule TAKE 1 CAPSULE TWICE DAILY 180 capsule 2  . ELIQUIS 5 MG TABS tablet TAKE 1 TABLET TWICE DAILY 180 tablet 3  . erythromycin ophthalmic ointment Place 1 application into the left eye at bedtime. 3.5 g 0  . GLUCOSAMINE SULFATE PO Take 2 tablets by mouth daily.     . metoprolol tartrate (LOPRESSOR) 25 MG tablet TAKE 1/2 TABLET TWICE DAILY 90 tablet 1  . Multiple Vitamin (MULTIVITAMIN)  tablet Take 1 tablet by mouth daily.     . Multiple Vitamins-Minerals (PRESERVISION AREDS 2 PO) Take 1 capsule by mouth 2 (two) times daily.     . potassium chloride SA (KLOR-CON) 20 MEQ tablet TAKE 1 TABLET FOUR TIMES DAILY 360 tablet 3  . pravastatin (PRAVACHOL) 20 MG tablet TAKE 1 TABLET EVERY DAY 90 tablet 1  . amLODipine (NORVASC) 5 MG tablet TAKE 1 AND 1/2 TABLETS EVERY DAY (Patient not taking: Reported on 11/30/2019) 135 tablet 1  . tobramycin (TOBREX) 0.3 % ophthalmic solution Place 1 drop into the right eye every 6 (six) hours. 5 mL 0   No current facility-administered medications for this visit.    Allergies: Patient has no known allergies.  Past Medical History:  Diagnosis Date  . Arthritis    "hips" (11/20/2014)  . Benign essential HTN   . Fasting hyperglycemia 2006   FBS 111; normal A1c  . Hepatitis ~ 1955   "yellow juandice"  . Hyperlipidemia   . Internal hemorrhoids   . Persistent atrial fibrillation ([place]) 08/24/2014   CHASD2VASC score is 3 - on Apixaban  . Sleep apnea    intolerant to CPAP and now using oral device followed by Dr. Toy Cookey  . Tubular adenoma of colon 2006   Dr Fuller Plan    Past Surgical History:  Procedure Laterality Date  . CARDIOVERSION N/A 10/12/2014   Procedure: CARDIOVERSION;  Surgeon: Darlin Coco, MD;  Location:  [facility] ENDOSCOPY;  Service: Cardiovascular;  Laterality: N/A;  . COLONOSCOPY  2011   no polyp  . COLONOSCOPY W/ POLYPECTOMY  2006   [facility]  . NASAL SINUS SURGERY  1997  . PROSTATE BIOPSY  2010   Negative,Dr.Ottelin  . TONSILLECTOMY AND ADENOIDECTOMY  1997  . 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 TOTAL HIP ARTHROPLASTY Right 05/17/2018   Procedure: TOTAL HIP ARTHROPLASTY ANTERIOR APPROACH;  Surgeon: Gaynelle Arabian, MD;  Location: [facility] ORS;  Service: Orthopedics;  Laterality: Right;  . UVULOPALATOPHARYNGOPLASTY  1997   Dr.Woliki    Family History  Problem Relation Age of Onset  . Prostate cancer Father   . Hypertension  Father   . Hypertension Mother   . Prostate cancer Brother   . Melanoma Sister   . Atrial fibrillation Brother   . Melanoma Brother   . Lupus Sister   . Diabetes Neg Hx   . Heart disease Neg Hx   . Stroke Neg Hx   . Colon cancer Neg Hx     Social History   Tobacco Use  . Smoking status: Former Smoker    Packs/day: 1.00    Years: 7.00    Pack years: 7.00    Types: Cigarettes  . Smokeless tobacco: Never Used  . Tobacco comment: smoked cigarettes age [age], up to 1ppd  Substance Use Topics  . Alcohol use: Yes    Alcohol/week: 2.0 standard drinks    Types: 1 Glasses of wine, 1 Cans of beer per week    Comment: less than 1 drink per week    Subjective:   Concern for stye in right eye; has been having increased swelling in upper right eyelid; no vision changes; does feel that symptoms are actually improving;      Objective:  Vitals:   11/30/19 1205  BP: (!) 148/74  Pulse: (!) 49  Temp: 98.1 F (36.7 C)  TempSrc: Oral  SpO2: 98%  Weight: 185 lb (83.9 kg)  Height: 5\' 7"  (1.702 m)    General: Well developed, well nourished, in no acute distress  Skin : Warm and dry.  Head: Normocephalic and atraumatic  Eyes: Sclera and conjunctiva clear; pupils round and reactive to light; extraocular movements intact; stye noted in upper corner of upper right lid Lungs: Respirations unlabored;  Neurologic: Alert and oriented; speech intact; face symmetrical; moves all extremities well; CNII-XII intact without focal deficit   Assessment:  1. Hordeolum externum of right upper eyelid     Plan:  Rx for Tobramycin Opht Solution use q 6 hours as directed; apply warm compresses to area as tolerated; follow-up worse, no better.  This visit occurred during the SARS-CoV-2 public health emergency.  Safety protocols were in place, including screening questions prior to the visit, additional usage of staff PPE, and extensive cleaning of exam room while observing appropriate contact time as  indicated for disinfecting solutions.     No follow-ups on file.  No orders of the defined types were placed in this encounter.   Requested Prescriptions   Signed Prescriptions Disp Refills  . tobramycin (TOBREX) 0.3 % ophthalmic solution 5 mL 0    Sig: Place 1 drop into the right eye every 6 (six) hours.

## 2019-12-15 ENCOUNTER — Other Ambulatory Visit: Payer: Self-pay

## 2019-12-15 ENCOUNTER — Encounter (HOSPITAL_COMMUNITY): Payer: Self-pay | Admitting: Nurse Practitioner

## 2019-12-15 ENCOUNTER — Ambulatory Visit (HOSPITAL_COMMUNITY)
Admission: RE | Admit: 2019-12-15 | Discharge: 2019-12-15 | Disposition: A | Discharge status: Home / Self Care | Payer: Medicare Other | Source: Ambulatory Visit | Attending: Nurse Practitioner | Admitting: Nurse Practitioner

## 2019-12-15 VITALS — BP 146/74 | HR 52 | Ht 67.0 in | Wt 186.4 lb

## 2019-12-15 DIAGNOSIS — Z8249 Family history of ischemic heart disease and other diseases of the circulatory system: Secondary | ICD-10-CM | POA: Insufficient documentation

## 2019-12-15 DIAGNOSIS — I480 Paroxysmal atrial fibrillation: Secondary | ICD-10-CM | POA: Diagnosis not present

## 2019-12-15 DIAGNOSIS — Z7901 Long term (current) use of anticoagulants: Secondary | ICD-10-CM | POA: Insufficient documentation

## 2019-12-15 DIAGNOSIS — E785 Hyperlipidemia, unspecified: Secondary | ICD-10-CM | POA: Diagnosis not present

## 2019-12-15 DIAGNOSIS — Z87891 Personal history of nicotine dependence: Secondary | ICD-10-CM | POA: Insufficient documentation

## 2019-12-15 DIAGNOSIS — Z79899 Other long term (current) drug therapy: Secondary | ICD-10-CM | POA: Insufficient documentation

## 2019-12-15 DIAGNOSIS — D6869 Other thrombophilia: Secondary | ICD-10-CM | POA: Diagnosis not present

## 2019-12-15 DIAGNOSIS — G4733 Obstructive sleep apnea (adult) (pediatric): Secondary | ICD-10-CM | POA: Insufficient documentation

## 2019-12-15 DIAGNOSIS — I10 Essential (primary) hypertension: Secondary | ICD-10-CM | POA: Insufficient documentation

## 2019-12-15 LAB — BASIC METABOLIC PANEL
Anion gap: 8 (ref 5–15)
BUN: 19 mg/dL (ref 8–23)
CO2: 28 mmol/L (ref 22–32)
Calcium: 9.3 mg/dL (ref 8.9–10.3)
Chloride: 102 mmol/L (ref 98–111)
Creatinine, Ser: 1.07 mg/dL (ref 0.61–1.24)
GFR calc Af Amer: >60 mL/min (ref >60)
GFR calc non Af Amer: >60 mL/min (ref >60)
Glucose, Bld: 148 mg/dL — ABNORMAL HIGH (ref 70–99)
Potassium: 4.3 mmol/L (ref 3.5–5.1)
Sodium: 138 mmol/L (ref 135–145)

## 2019-12-15 LAB — MAGNESIUM: Magnesium: 2.1 mg/dL (ref 1.7–2.4)

## 2019-12-15 NOTE — Progress Notes (Signed)
Patient ID: Daniel Tucker, male   DOB: 09/28/1940, 79 y.o.   MRN: [id number]     Primary Care Physician: Burns, Stacy J, MD Referring Physician: Dr. Allred   Daniel Tucker is a 79 y.o. male with a h/o paroxysmal afib maintaining SR on tikosyn. He is in the afib clinic for f/u surveillance of Tikosyn. He did go thru a hip replacement in February 2020 without any issues. Short episode  afib lst week that responded well to extra dose of BB. Pending 2nd covid shot 3/3. Qtc is stable.   F/u in the afib clinic, 8/26, for surveillance ofTikosyn. He forgot his Tikosyn a couple of weeks ago and did have breakthrough afib.He did convert the next day. No further  afib.   Today, he denies symptoms of palpitations, chest pain, shortness of breath, orthopnea, PND, lower extremity edema, dizziness, presyncope, syncope, or neurologic sequela. The patient is tolerating medications without difficulties and is otherwise without complaint today.   Past Medical History:  Diagnosis Date  . Arthritis    "hips" (11/20/2014)  . Benign essential HTN   . Fasting hyperglycemia 2006   FBS 111; normal A1c  . Hepatitis ~ 1955   "yellow juandice"  . Hyperlipidemia   . Internal hemorrhoids   . Persistent atrial fibrillation ([facility]) 08/24/2014   CHASD2VASC score is 3 - on Apixaban  . Sleep apnea    intolerant to CPAP and now using oral device followed by Dr. Fuller  . Tubular adenoma of colon 2006   Dr Stark   Past Surgical History:  Procedure Laterality Date  . CARDIOVERSION N/A 10/12/2014   Procedure: CARDIOVERSION;  Surgeon: Thomas Brackbill, MD;  Location: [facility] ENDOSCOPY;  Service: Cardiovascular;  Laterality: N/A;  . COLONOSCOPY  2011   no polyp  . COLONOSCOPY W/ POLYPECTOMY  2006     . NASAL SINUS SURGERY  1997  . PROSTATE BIOPSY  2010   Negative,Dr.Ottelin  . TONSILLECTOMY AND ADENOIDECTOMY  1997  . TOTAL HIP ARTHROPLASTY Left 08/2010   Dr Alusio  . TOTAL HIP ARTHROPLASTY Right 05/17/2018    Procedure: TOTAL HIP ARTHROPLASTY ANTERIOR APPROACH;  Surgeon: Aluisio, Frank, MD;  Location: [facility]S;  Service: Orthopedics;  Laterality: Right;  . UVULOPALATOPHARYNGOPLASTY  1997   Dr.Woliki    Current Outpatient Medications  Medication Sig Dispense Refill  . acetaminophen (TYLENOL) 500 MG tablet Take 500 mg by mouth every 6 (six) hours as needed for moderate pain or headache.     . acyclovir (ZOVIRAX) 200 MG capsule TAKE 2 CAPSULES EVERY DAY 180 capsule 1  . amLODipine (NORVASC) 5 MG tablet TAKE 1 AND 1/2 TABLETS EVERY DAY 135 tablet 1  . amoxicillin (AMOXIL) 500 MG capsule amoxicillin 500 mg capsule  TAKE 4 CAPSULES BY MOUTH 1 HOUR PRIOR TO DENTAL PROCEDURE    . Ascorbic Acid (VITAMIN C) 1000 MG tablet Take 1,000 mg by mouth daily.      . Cholecalciferol (VITAMIN D) 50 MCG (2000 UT) tablet Take 2,000 Units by mouth daily.    . docusate sodium (COLACE) 100 MG capsule Take 100 mg by mouth 2 (two) times daily.     . dofetilide (TIKOSYN) 500 MCG capsule TAKE 1 CAPSULE TWICE DAILY 180 capsule 2  . ELIQUIS 5 MG TABS tablet TAKE 1 TABLET TWICE DAILY 180 tablet 3  . GLUCOSAMINE SULFATE PO Take 2 tablets by mouth daily.     . metoprolol tartrate (LOPRESSOR) 25 MG tablet TAKE 1/2 TABLET TWICE DAILY 90 tablet 1  .   Multiple Vitamin (MULTIVITAMIN) tablet Take 1 tablet by mouth daily.     . Multiple Vitamins-Minerals (PRESERVISION AREDS 2 PO) Take 1 capsule by mouth 2 (two) times daily.     . potassium chloride SA (KLOR-CON) 20 MEQ tablet TAKE 1 TABLET FOUR TIMES DAILY 360 tablet 3  . pravastatin (PRAVACHOL) 20 MG tablet TAKE 1 TABLET EVERY DAY 90 tablet 1   No current facility-administered medications for this encounter.    No Known Allergies  Social History   Socioeconomic History  . Marital status: Married    Spouse name: Not on file  . Number of children: 3  . Years of education: Not on file  . Highest education level: Not on file  Occupational History  . Occupation: retired   Tobacco  Use  . Smoking status: Former Smoker    Packs/day: 1.00    Years: 7.00    Pack years: 7.00    Types: Cigarettes  . Smokeless tobacco: Never Used  . Tobacco comment: smoked cigarettes age [age], up to 1ppd  Vaping Use  . Vaping Use: Never used  Substance and Sexual Activity  . Alcohol use: Yes    Alcohol/week: 2.0 standard drinks    Types: 1 Glasses of wine, 1 Cans of beer per week    Comment: less than 1 drink per week  . Drug use: No  . Sexual activity: Yes  Other Topics Concern  . Not on file  Social History Narrative  . Not on file   Social Determinants of Health   Financial Resource Strain:   . Difficulty of Paying Living Expenses: Not on file  Food Insecurity:   . Worried About Running Out of Food in the Last Year: Not on file  . Ran Out of Food in the Last Year: Not on file  Transportation Needs:   . Lack of Transportation (Medical): Not on file  . Lack of Transportation (Non-Medical): Not on file  Physical Activity:   . Days of Exercise per Week: Not on file  . Minutes of Exercise per Session: Not on file  Stress:   . Feeling of Stress : Not on file  Social Connections:   . Frequency of Communication with Friends and Family: Not on file  . Frequency of Social Gatherings with Friends and Family: Not on file  . Attends Religious Services: Not on file  . Active Member of Clubs or Organizations: Not on file  . Attends Club or Organization Meetings: Not on file  . Marital Status: Not on file  Intimate Partner Violence:   . Fear of Current or Ex-Partner: Not on file  . Emotionally Abused: Not on file  . Physically Abused: Not on file  . Sexually Abused: Not on file    Family History  Problem Relation Age of Onset  . Prostate cancer Father   . Hypertension Father   . Hypertension Mother   . Prostate cancer Brother   . Melanoma Sister   . Atrial fibrillation Brother   . Melanoma Brother   . Lupus Sister   . Diabetes Neg Hx   . Heart disease Neg Hx   .  Stroke Neg Hx   . Colon cancer Neg Hx     ROS- All systems are reviewed and negative except as per the HPI above  Physical Exam: Vitals:   12/15/19 1042  BP: (!) 146/74  Pulse: (!) 52  Weight: 84.6 kg  Height: 5' 7" (1.702 m)    GEN- The patient is   well appearing, alert and oriented x 3 today.   Head- normocephalic, atraumatic Eyes-  Sclera clear, conjunctiva pink Ears- hearing intact Oropharynx- clear Neck- supple, no JVP Lymph- no cervical lymphadenopathy Lungs- Clear to ausculation bilaterally, normal work of breathing Heart- regular rate and rhythm  no murmurs, rubs or gallops, PMI not laterally displaced GI- soft, NT, ND, + BS Extremities- no clubbing, cyanosis, or edema MS- no significant deformity or atrophy Skin- no rash or lesion Psych- euthymic mood, full affect Neuro- strength and sensation are intact  EKG- sinus brady at 52 bpm, pr int 164 ms, qrs int 136 ms, qtc 435  ms Epic records reviewed.  Assessment and Plan: 1. Afib  Low afib burden Continue on tikosyn 500 mg bid  qtc stable Continue apixaban 5 mg bid, no bleeding issues Bmet/mag/cbc today  2.OSA Still wearing mouthpiece  3. HTN  Stable Avoid salt  4. Asymptomatic brady Improved with reduction of metoprolol   F/u in 6 months    Donna C. Carroll, ANP-C [facility] [address] [place], [place] [address] [phone]        

## 2020-01-03 DIAGNOSIS — Z23 Encounter for immunization: Secondary | ICD-10-CM | POA: Diagnosis not present

## 2020-01-10 NOTE — Progress Notes (Signed)
Subjective:    Patient ID: Daniel Tucker, male    DOB: 10-16-1940, 79 y.o.   MRN: [id number]  HPI The patient is here for follow up of their chronic medical problems, including Afib, htn, hyperlipidemia, prediabetes, oral herpes simplex  He is taking all of his medications as prescribed.    He is exercising irregularly.     He has no concerns.   Medications and allergies reviewed with patient and updated if appropriate.  Patient Active Problem List   Diagnosis Date Noted  . Chalazion left lower eyelid 05/18/2019  . Hordeolum externum of right lower eyelid 12/03/2018  . History of right hip replacement 06/21/2018  . Acute cystitis without hematuria 05/31/2018  . OA (osteoarthritis) of hip 05/17/2018  . Pre-op examination 05/11/2018  . Hives 10/02/2017  . Prediabetes 09/05/2015  . 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 Arthritis of right hip 04/02/2015  . Atrial fibrillation ([place]) 08/24/2014  . Obstructive sleep apnea 07/28/2012  . Nonspecific abnormal electrocardiogram (ECG) (EKG) 05/15/2011  . Hyperlipidemia 10/10/2008  . Essential hypertension 10/10/2008  . History of colonic polyps 10/10/2008  . 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 acetaminophen (TYLENOL) 500 MG tablet Take 500 mg by mouth every 6 (six) hours as needed for moderate pain or headache.     Marland Kitchen acyclovir (ZOVIRAX) 200 MG capsule TAKE 2 CAPSULES EVERY DAY 180 capsule 1  . amLODipine (NORVASC) 5 MG tablet TAKE 1 AND 1/2 TABLETS EVERY DAY 135 tablet 1  . Ascorbic Acid (VITAMIN C) 1000 MG tablet Take 1,000 mg by mouth daily.      . Cholecalciferol (VITAMIN D) 50 MCG (2000 UT) tablet Take 2,000 Units by mouth daily.    Marland Kitchen docusate sodium (COLACE) 100 MG capsule Take 100 mg by mouth 2 (two) times daily.     Marland Kitchen dofetilide (TIKOSYN) 500 MCG capsule TAKE 1 CAPSULE TWICE DAILY 180 capsule 2  . ELIQUIS 5  MG TABS tablet TAKE 1 TABLET TWICE DAILY 180 tablet 3  . GLUCOSAMINE SULFATE PO Take 2 tablets by mouth daily.     . metoprolol tartrate (LOPRESSOR) 25 MG tablet TAKE 1/2 TABLET TWICE DAILY 90 tablet 1  . Multiple Vitamin (MULTIVITAMIN) tablet Take 1 tablet by mouth daily.     . Multiple Vitamins-Minerals (PRESERVISION AREDS 2 PO) Take 1 capsule by mouth 2 (two) times daily.     . potassium chloride SA (KLOR-CON) 20 MEQ tablet TAKE 1 TABLET FOUR TIMES DAILY 360 tablet 3  . pravastatin (PRAVACHOL) 20 MG tablet TAKE 1 TABLET EVERY DAY 90 tablet 1  . amoxicillin (AMOXIL) 500 MG capsule amoxicillin 500 mg capsule  TAKE 4 CAPSULES BY MOUTH 1 HOUR PRIOR TO DENTAL PROCEDURE (Patient not taking: Reported on 01/11/2020)     No current facility-administered medications on file prior to visit.    Past Medical History:  Diagnosis Date  . Arthritis    "hips" (11/20/2014)  . Benign essential HTN   . Fasting hyperglycemia 2006   FBS 111; normal A1c  . Hepatitis ~ 1955   "yellow juandice"  . Hyperlipidemia   . Internal hemorrhoids   . Persistent atrial fibrillation ([place]) 08/24/2014   CHASD2VASC score is 3 - on Apixaban  . Sleep apnea    intolerant to CPAP and now using oral device followed by Dr. Toy Cookey  . Tubular adenoma of colon 2006   Dr  Fuller Plan    Past Surgical History:  Procedure Laterality Date  . CARDIOVERSION N/A 10/12/2014   Procedure: CARDIOVERSION;  Surgeon: Darlin Coco, MD;  Location[facility] ENDOSCOPY;  Service: Cardiovascular;  Laterality: N/A;  . COLONOSCOPY  2011   no polyp  . COLONOSCOPY W/ POLYPECTOMY  2006   [place]  . NASAL SINUS SURGERY  1997  . PROSTATE BIOPSY  2010   Negative,Dr.Ottelin  . TONSILLECTOMY AND ADENOIDECTOMY  1997  . 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 TOTAL HIP ARTHROPLASTY Right 05/17/2018   Procedure: TOTAL HIP ARTHROPLASTY ANTERIOR APPROACH;  Surgeon: Gaynelle Arabian, MD;  Location: [facility] ORS;  Service: Orthopedics;  Laterality: Right;  .  UVULOPALATOPHARYNGOPLASTY  1997   Dr.Woliki    Social History   Socioeconomic History  . Marital status: Married    Spouse name: Not on file  . Number of children: 3  . Years of education: Not on file  . Highest education level: Not on file  Occupational History  . Occupation: retired   Tobacco Use  . Smoking status: Former Smoker    Packs/day: 1.00    Years: 7.00    Pack years: 7.00    Types: Cigarettes  . Smokeless tobacco: Never Used  . Tobacco comment: smoked cigarettes age [age], up to 1ppd  Vaping Use  . Vaping Use: Never used  Substance and Sexual Activity  . Alcohol use: Yes    Alcohol/week: 2.0 standard drinks    Types: 1 Glasses of wine, 1 Cans of beer per week    Comment: less than 1 drink per week  . Drug use: No  . Sexual activity: Yes  Other Topics Concern  . Not on file  Social History Narrative  . Not on file   Social Determinants of Health   Financial Resource Strain:   . Difficulty of Paying Living Expenses: Not on file  Food Insecurity:   . Worried About Charity fundraiser in the Last Year: Not on file  . Ran Out of Food in the Last Year: Not on file  Transportation Needs:   . Lack of Transportation (Medical): Not on file  . Lack of Transportation (Non-Medical): Not on file  Physical Activity:   . Days of Exercise per Week: Not on file  . Minutes of Exercise per Session: Not on file  Stress:   . Feeling of Stress : Not on file  Social Connections:   . Frequency of Communication with Friends and Family: Not on file  . Frequency of Social Gatherings with Friends and Family: Not on file  . Attends Religious Services: Not on file  . Active Member of Clubs or Organizations: Not on file  . Attends Archivist Meetings: Not on file  . Marital Status: Not on file    Family History  Problem Relation Age of Onset  . Prostate cancer Father   . Hypertension Father   . Hypertension Mother   . Prostate cancer Brother   . Melanoma Sister    . Atrial fibrillation Brother   . Melanoma Brother   . Lupus Sister   . Diabetes Neg Hx   . Heart disease Neg Hx   . Stroke Neg Hx   . Colon cancer Neg Hx     Review of Systems  Constitutional: Negative for fever.  Respiratory: Negative for cough, shortness of breath and wheezing.   Cardiovascular: Negative for chest pain and leg swelling.  Neurological: Negative for light-headedness and headaches.  Objective:   Vitals:   01/11/20 1043  BP: (!) 142/78  Pulse: (!) 58  Temp: 98.1 F (36.7 C)  SpO2: 97%   BP Readings from Last 3 Encounters:  01/11/20 (!) 142/78  12/15/19 (!) 146/74  11/30/19 (!) 148/74   Wt Readings from Last 3 Encounters:  01/11/20 185 lb 6.4 oz (84.1 kg)  12/15/19 186 lb 6.4 oz (84.6 kg)  11/30/19 185 lb (83.9 kg)   Body mass index is 29.04 kg/m.   Physical Exam    Constitutional: Appears well-developed and well-nourished. No distress.  HENT:  Head: Normocephalic and atraumatic.  Neck: Neck supple. No tracheal deviation present. No thyromegaly present.  No cervical lymphadenopathy Cardiovascular: Normal rate, regular rhythm and normal heart sounds.   No murmur heard. No carotid bruit .  No edema Pulmonary/Chest: Effort normal and breath sounds normal. No respiratory distress. No has no wheezes. No rales.  Skin: Skin is warm and dry. Not diaphoretic.  Psychiatric: Normal mood and affect. Behavior is normal.      Assessment & Plan:    See Problem List for Assessment and Plan of chronic medical problems.    This visit occurred during the SARS-CoV-2 public health emergency.  Safety protocols were in place, including screening questions prior to the visit, additional usage of staff PPE, and extensive cleaning of exam room while observing appropriate contact time as indicated for disinfecting solutions.

## 2020-01-10 NOTE — Patient Instructions (Addendum)
  Blood work was ordered.   ° ° °Medications reviewed and updated.  Changes include :   none ° ° ° °Please followup in 6 months ° ° °

## 2020-01-11 ENCOUNTER — Other Ambulatory Visit: Payer: Self-pay

## 2020-01-11 ENCOUNTER — Ambulatory Visit (INDEPENDENT_AMBULATORY_CARE_PROVIDER_SITE_OTHER): Payer: Medicare Other | Admitting: Internal Medicine

## 2020-01-11 ENCOUNTER — Encounter: Payer: Self-pay | Admitting: Internal Medicine

## 2020-01-11 ENCOUNTER — Ambulatory Visit (INDEPENDENT_AMBULATORY_CARE_PROVIDER_SITE_OTHER): Payer: Medicare Other

## 2020-01-11 VITALS — BP 142/78 | HR 58 | Temp 98.1°F | Wt 185.4 lb

## 2020-01-11 VITALS — BP 142/78 | HR 58 | Temp 98.1°F | Ht 67.0 in | Wt 185.4 lb

## 2020-01-11 DIAGNOSIS — R7303 Prediabetes: Secondary | ICD-10-CM

## 2020-01-11 DIAGNOSIS — E782 Mixed hyperlipidemia: Secondary | ICD-10-CM | POA: Diagnosis not present

## 2020-01-11 DIAGNOSIS — Z1159 Encounter for screening for other viral diseases: Secondary | ICD-10-CM

## 2020-01-11 DIAGNOSIS — I4891 Unspecified atrial fibrillation: Secondary | ICD-10-CM | POA: Diagnosis not present

## 2020-01-11 DIAGNOSIS — Z0000 Encounter for general adult medical examination without abnormal findings: Secondary | ICD-10-CM | POA: Diagnosis not present

## 2020-01-11 DIAGNOSIS — B002 Herpesviral gingivostomatitis and pharyngotonsillitis: Secondary | ICD-10-CM

## 2020-01-11 DIAGNOSIS — I10 Essential (primary) hypertension: Secondary | ICD-10-CM | POA: Diagnosis not present

## 2020-01-11 NOTE — Patient Instructions (Addendum)
Daniel Tucker , Thank you for taking time to come for your Medicare Wellness Visit. I appreciate your ongoing commitment to your health goals. Please review the following plan we discussed and let me know if I can assist you in the future.   Screening recommendations/referrals: Colonoscopy: 08/02/2015; due every 5 years Recommended yearly ophthalmology/optometry visit for glaucoma screening and checkup Recommended yearly dental visit for hygiene and checkup  Vaccinations: Influenza vaccine: 12/30/2019 Pneumococcal vaccine: up to date Tdap vaccine: 02/25/2019 Shingles vaccine: up to date   Covid-19: up to date  Advanced directives: Please bring a copy of your health care power of attorney and living will to the office at your convenience.   Conditions/risks identified: Yes; Reviewed health maintenance screenings with patient today and relevant education, vaccines, and/or referrals were provided. Please continue to do your personal lifestyle choices by: daily care of teeth and gums, regular physical activity (goal should be 5 days a week for 30 minutes), eat a healthy diet, avoid tobacco and drug use, limiting any alcohol intake, taking a low-dose aspirin (if not allergic or have been advised by your provider otherwise) and taking vitamins and minerals as recommended by your provider. Continue doing brain stimulating activities (puzzles, reading, adult coloring books, staying active) to keep memory sharp. Continue to eat heart healthy diet (full of fruits, vegetables, whole grains, lean protein, water--limit salt, fat, and sugar intake) and increase physical activity as tolerated.  Next appointment: Please schedule your next Medicare Wellness Visit with your Nurse Health Advisor in 1 year.   Preventive Care 50 Years and Older, Male Preventive care refers to lifestyle choices and visits with your health care provider that can promote health and wellness. What does preventive care include?  A  yearly physical exam. This is also called an annual well check.  Dental exams once or twice a year.  Routine eye exams. Ask your health care provider how often you should have your eyes checked.  Personal lifestyle choices, including:  Daily care of your teeth and gums.  Regular physical activity.  Eating a healthy diet.  Avoiding tobacco and drug use.  Limiting alcohol use.  Practicing safe sex.  Taking low doses of aspirin every day.  Taking vitamin and mineral supplements as recommended by your health care provider. What happens during an annual well check? The services and screenings done by your health care provider during your annual well check will depend on your age, overall health, lifestyle risk factors, and family history of disease. Counseling  Your health care provider may ask you questions about your:  Alcohol use.  Tobacco use.  Drug use.  Emotional well-being.  Home and relationship well-being.  Sexual activity.  Eating habits.  History of falls.  Memory and ability to understand (cognition).  Work and work Statistician. Screening  You may have the following tests or measurements:  Height, weight, and BMI.  Blood pressure.  Lipid and cholesterol levels. These may be checked every 5 years, or more frequently if you are over 6 years old.  Skin check.  Lung cancer screening. You may have this screening every year starting at age 5 if you have a 30-pack-year history of smoking and currently smoke or have quit within the past 15 years.  Fecal occult blood test (FOBT) of the stool. You may have this test every year starting at age 34.  Flexible sigmoidoscopy or colonoscopy. You may have a sigmoidoscopy every 5 years or a colonoscopy every 10 years starting at age 47.  Prostate cancer screening. Recommendations will vary depending on your family history and other risks.  Hepatitis C blood test.  Hepatitis B blood test.  Sexually  transmitted disease (STD) testing.  Diabetes screening. This is done by checking your blood sugar (glucose) after you have not eaten for a while (fasting). You may have this done every 1-3 years.  Abdominal aortic aneurysm (AAA) screening. You may need this if you are a current or former smoker.  Osteoporosis. You may be screened starting at age 10 if you are at high risk. Talk with your health care provider about your test results, treatment options, and if necessary, the need for more tests. Vaccines  Your health care provider may recommend certain vaccines, such as:  Influenza vaccine. This is recommended every year.  Tetanus, diphtheria, and acellular pertussis (Tdap, Td) vaccine. You may need a Td booster every 10 years.  Zoster vaccine. You may need this after age 68.  Pneumococcal 13-valent conjugate (PCV13) vaccine. One dose is recommended after age 51.  Pneumococcal polysaccharide (PPSV23) vaccine. One dose is recommended after age 33. Talk to your health care provider about which screenings and vaccines you need and how often you need them. This information is not intended to replace advice given to you by your health care provider. Make sure you discuss any questions you have with your health care provider. Document Released: 05/04/2015 Document Revised: 12/26/2015 Document Reviewed: 02/06/2015 Elsevier Interactive Patient Education  2017 [place] Prevention in the Home Falls can cause injuries. They can happen to people of all ages. There are many things you can do to make your home safe and to help prevent falls. What can I do on the outside of my home?  Regularly fix the edges of walkways and driveways and fix any cracks.  Remove anything that might make you trip as you walk through a door, such as a raised step or threshold.  Trim any bushes or trees on the path to your home.  Use bright outdoor lighting.  Clear any walking paths of anything that might  make someone trip, such as rocks or tools.  Regularly check to see if handrails are loose or broken. Make sure that both sides of any steps have handrails.  Any raised decks and porches should have guardrails on the edges.  Have any leaves, snow, or ice cleared regularly.  Use sand or salt on walking paths during winter.  Clean up any spills in your garage right away. This includes oil or grease spills. What can I do in the bathroom?  Use night lights.  Install grab bars by the toilet and in the tub and shower. Do not use towel bars as grab bars.  Use non-skid mats or decals in the tub or shower.  If you need to sit down in the shower, use a plastic, non-slip stool.  Keep the floor dry. Clean up any water that spills on the floor as soon as it happens.  Remove soap buildup in the tub or shower regularly.  Attach bath mats securely with double-sided non-slip rug tape.  Do not have throw rugs and other things on the floor that can make you trip. What can I do in the bedroom?  Use night lights.  Make sure that you have a light by your bed that is easy to reach.  Do not use any sheets or blankets that are too big for your bed. They should not hang down onto the floor.  Have  a firm chair that has side arms. You can use this for support while you get dressed.  Do not have throw rugs and other things on the floor that can make you trip. What can I do in the kitchen?  Clean up any spills right away.  Avoid walking on wet floors.  Keep items that you use a lot in easy-to-reach places.  If you need to reach something above you, use a strong step stool that has a grab bar.  Keep electrical cords out of the way.  Do not use floor polish or wax that makes floors slippery. If you must use wax, use non-skid floor wax.  Do not have throw rugs and other things on the floor that can make you trip. What can I do with my stairs?  Do not leave any items on the stairs.  Make sure  that there are handrails on both sides of the stairs and use them. Fix handrails that are broken or loose. Make sure that handrails are as long as the stairways.  Check any carpeting to make sure that it is firmly attached to the stairs. Fix any carpet that is loose or worn.  Avoid having throw rugs at the top or bottom of the stairs. If you do have throw rugs, attach them to the floor with carpet tape.  Make sure that you have a light switch at the top of the stairs and the bottom of the stairs. If you do not have them, ask someone to add them for you. What else can I do to help prevent falls?  Wear shoes that:  Do not have high heels.  Have rubber bottoms.  Are comfortable and fit you well.  Are closed at the toe. Do not wear sandals.  If you use a stepladder:  Make sure that it is fully opened. Do not climb a closed stepladder.  Make sure that both sides of the stepladder are locked into place.  Ask someone to hold it for you, if possible.  Clearly mark and make sure that you can see:  Any grab bars or handrails.  First and last steps.  Where the edge of each step is.  Use tools that help you move around (mobility aids) if they are needed. These include:  Canes.  Walkers.  Scooters.  Crutches.  Turn on the lights when you go into a dark area. Replace any light bulbs as soon as they burn out.  Set up your furniture so you have a clear path. Avoid moving your furniture around.  If any of your floors are uneven, fix them.  If there are any pets around you, be aware of where they are.  Review your medicines with your doctor. Some medicines can make you feel dizzy. This can increase your chance of falling. Ask your doctor what other things that you can do to help prevent falls. This information is not intended to replace advice given to you by your health care provider. Make sure you discuss any questions you have with your health care provider. Document  Released: 02/01/2009 Document Revised: 09/13/2015 Document Reviewed: 05/12/2014 Elsevier Interactive Patient Education  2017 [employer].

## 2020-01-11 NOTE — Assessment & Plan Note (Signed)
Chronic BP adequately controlled Current regimen effective and well tolerated Continue current medications at current doses cmp

## 2020-01-11 NOTE — Progress Notes (Signed)
Subjective:   CAYETANO MIKITA is a 79 y.o. male who presents for Medicare Annual/Subsequent preventive examination.  Review of Systems    No ROS.Medicare Wellness Visit Cardiac Risk Factors include: advanced age (>96men, >54 women);family history of premature cardiovascular disease;hypertension;male gender;dyslipidemia     Objective:    Today's Vitals   01/11/20 1119  BP: (!) 142/78  Pulse: (!) 58  Temp: 98.1 F (36.7 C)  SpO2: 97%  Weight: 185 lb 6.4 oz (84.1 kg)  Height: 5\' 7"  (1.702 m)   Body mass index is 29.04 kg/m.  Advanced Directives 01/11/2020 05/17/2018 05/12/2018 09/11/2017 02/05/2017 10/02/2016 09/05/2016  Does Patient Have a Medical Advance Directive? No No No No No No No  Does patient want to make changes to medical advance directive? No - Patient declined - - - - - -  Would patient like information on creating a medical advance directive? No - Patient declined No - Patient declined No - Patient declined No - Patient declined No - Patient declined No - Patient declined No - Patient declined    Current Medications (verified) Outpatient Encounter Medications as of 01/11/2020  Medication Sig  . acetaminophen (TYLENOL) 500 MG tablet Take 500 mg by mouth every 6 (six) hours as needed for moderate pain or headache.   Marland Kitchen acyclovir (ZOVIRAX) 200 MG capsule TAKE 2 CAPSULES EVERY DAY  . amLODipine (NORVASC) 5 MG tablet TAKE 1 AND 1/2 TABLETS EVERY DAY  . amoxicillin (AMOXIL) 500 MG capsule amoxicillin 500 mg capsule  TAKE 4 CAPSULES BY MOUTH 1 HOUR PRIOR TO DENTAL PROCEDURE (Patient not taking: Reported on 01/11/2020)  . Ascorbic Acid (VITAMIN C) 1000 MG tablet Take 1,000 mg by mouth daily.    . Cholecalciferol (VITAMIN D) 50 MCG (2000 UT) tablet Take 2,000 Units by mouth daily.  Marland Kitchen docusate sodium (COLACE) 100 MG capsule Take 100 mg by mouth 2 (two) times daily.   Marland Kitchen dofetilide (TIKOSYN) 500 MCG capsule TAKE 1 CAPSULE TWICE DAILY  . ELIQUIS 5 MG TABS tablet TAKE 1 TABLET  TWICE DAILY  . GLUCOSAMINE SULFATE PO Take 2 tablets by mouth daily.   . metoprolol tartrate (LOPRESSOR) 25 MG tablet TAKE 1/2 TABLET TWICE DAILY  . Multiple Vitamin (MULTIVITAMIN) tablet Take 1 tablet by mouth daily.   . Multiple Vitamins-Minerals (PRESERVISION AREDS 2 PO) Take 1 capsule by mouth 2 (two) times daily.   . potassium chloride SA (KLOR-CON) 20 MEQ tablet TAKE 1 TABLET FOUR TIMES DAILY  . pravastatin (PRAVACHOL) 20 MG tablet TAKE 1 TABLET EVERY DAY   No facility-administered encounter medications on file as of 01/11/2020.    Allergies (verified) Patient has no known allergies.   History: Past Medical History:  Diagnosis Date  . Arthritis    "hips" (11/20/2014)  . Benign essential HTN   . Fasting hyperglycemia 2006   FBS 111; normal A1c  . Hepatitis ~ 1955   "yellow juandice"  . Hyperlipidemia   . Internal hemorrhoids   . Persistent atrial fibrillation ([place]) 08/24/2014   CHASD2VASC score is 3 - on Apixaban  . Sleep apnea    intolerant to CPAP and now using oral device followed by Dr. Toy Cookey  . Tubular adenoma of colon 2006   Dr Fuller Plan   Past Surgical History:  Procedure Laterality Date  . CARDIOVERSION N/A 10/12/2014   Procedure: CARDIOVERSION;  Surgeon: Darlin Coco, MD;  Location: [facility] ENDOSCOPY;  Service: Cardiovascular;  Laterality: N/A;  . COLONOSCOPY  2011   no polyp  .  COLONOSCOPY W/ POLYPECTOMY  2006   [place]  . NASAL SINUS SURGERY  1997  . PROSTATE BIOPSY  2010   Negative,Dr.Ottelin  . TONSILLECTOMY AND ADENOIDECTOMY  1997  . 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 TOTAL HIP ARTHROPLASTY Right 05/17/2018   Procedure: TOTAL HIP ARTHROPLASTY ANTERIOR APPROACH;  Surgeon: Gaynelle Arabian, MD;  Location: [facility] ORS;  Service: Orthopedics;  Laterality: Right;  . UVULOPALATOPHARYNGOPLASTY  1997   Dr.Woliki   Family History  Problem Relation Age of Onset  . Prostate cancer Father   . Hypertension Father   . Hypertension Mother   . Prostate  cancer Brother   . Melanoma Sister   . Atrial fibrillation Brother   . Melanoma Brother   . Lupus Sister   . Diabetes Neg Hx   . Heart disease Neg Hx   . Stroke Neg Hx   . Colon cancer Neg Hx    Social History   Socioeconomic History  . Marital status: Married    Spouse name: Not on file  . Number of children: 3  . Years of education: Not on file  . Highest education level: Not on file  Occupational History  . Occupation: retired   Tobacco Use  . Smoking status: Former Smoker    Packs/day: 1.00    Years: 7.00    Pack years: 7.00    Types: Cigarettes  . Smokeless tobacco: Never Used  . Tobacco comment: smoked cigarettes age [age], up to 1ppd  Vaping Use  . Vaping Use: Never used  Substance and Sexual Activity  . Alcohol use: Yes    Alcohol/week: 2.0 standard drinks    Types: 1 Glasses of wine, 1 Cans of beer per week    Comment: less than 1 drink per week  . Drug use: No  . Sexual activity: Yes  Other Topics Concern  . Not on file  Social History Narrative  . Not on file   Social Determinants of Health   Financial Resource Strain: Low Risk   . Difficulty of Paying Living Expenses: Not hard at all  Food Insecurity:   . Worried About Charity fundraiser in the Last Year: Not on file  . Ran Out of Food in the Last Year: Not on file  Transportation Needs: No Transportation Needs  . Lack of Transportation (Medical): No  . Lack of Transportation (Non-Medical): No  Physical Activity: Sufficiently Active  . Days of Exercise per Week: 5 days  . Minutes of Exercise per Session: 30 min  Stress: No Stress Concern Present  . Feeling of Stress : Not at all  Social Connections: Socially Integrated  . Frequency of Communication with Friends and Family: More than three times a week  . Frequency of Social Gatherings with Friends and Family: More than three times a week  . Attends Religious Services: More than 4 times per year  . Active Member of Clubs or Organizations: Yes    . Attends Archivist Meetings: More than 4 times per year  . Marital Status: Married    Tobacco Counseling Counseling given: Not Answered Comment: smoked cigarettes age [age], up to 1ppd   Clinical Intake:  Pre-visit preparation completed: Yes  Pain : No/denies pain     BMI - recorded: 29.4 Nutritional Status: BMI 25 -29 Overweight Nutritional Risks: None Diabetes: No  How often do you need to have someone help you when you read instructions, pamphlets, or other written materials from your doctor  or pharmacy?: 1 - Never What is the last grade level you completed in school?: Bachelor's Degree  Diabetic? no  Interpreter Needed?: No  Information entered by :: Khai Torbert N. Leyda Vanderwerf, LPN   Activities of Daily Living In your present state of health, do you have any difficulty performing the following activities: 01/11/2020  Hearing? N  Vision? N  Difficulty concentrating or making decisions? N  Walking or climbing stairs? N  Dressing or bathing? N  Doing errands, shopping? N  Preparing Food and eating ? N  Using the Toilet? N  In the past six months, have you accidently leaked urine? N  Do you have problems with loss of bowel control? N  Managing your Medications? N  Managing your Finances? N  Housekeeping or managing your Housekeeping? N  Some recent data might be hidden    Patient Care Team: Binnie Rail, MD as PCP - General (Internal Medicine) Gaynelle Arabian, MD as Consulting Physician (Orthopedic Surgery) Freada Bergeron, CMA as Technician Melida Quitter, MD as Consulting Physician (Otolaryngology) Ladene Artist, MD as Consulting Physician (Gastroenterology) Thompson Grayer, MD as Consulting Physician (Cardiology)  Indicate any recent Medical Services you may have received from other than [facility] providers in the past year (date may be approximate).     Assessment:   This is a routine wellness examination for [place].  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 Continue with my home projects, enjoy life, family, and church    . Patient Stated     I want to continue to work on my inside and outside projects which keeps me active. 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     . Patient Stated     To maintain my current health status by continuing to eat healthy, stay physically active and socially active.      Depression Screen PHQ 2/9 Scores 01/11/2020 05/18/2019 05/11/2018 09/11/2017 09/05/2016 08/24/2014 07/28/2012  PHQ - 2 Score 0 0 0 0 0 0 0    Fall Risk Fall Risk  01/11/2020 12/03/2018 11/18/2018 05/11/2018 09/11/2017  Falls in the past year? 0 0 (No Data) 0 No  Comment - - Emmi Telephone Survey: data to providers prior to load - -  Number falls in past yr: 0 0 (No Data) - -  Comment - - Emmi Telephone Survey Actual Response =  - -  Injury with Fall? 0 0 - - -  Risk for fall due to : No Fall Risks - - - -  Follow up Falls evaluation completed Falls evaluation completed - - -    Any stairs in or around the home? Yes  If so, are there any without handrails? No  Home free of loose throw rugs in walkways, pet beds, electrical cords, etc? Yes  Adequate lighting in your home to reduce risk of falls? Yes   ASSISTIVE DEVICES UTILIZED TO PREVENT FALLS:  Life alert? No  Use of a cane, walker or w/c? No  Grab bars in the bathroom? Yes  Shower chair or bench in shower? Yes  Elevated toilet seat or a handicapped toilet? Yes   TIMED UP AND GO:  Was the test performed? No .  Length of time to ambulate 10 feet: 0 sec.   Gait steady and fast without use of  assistive device  Cognitive Function:     6CIT Screen 01/11/2020  What Year? 0 points  What month? 0 points  What time? 0 points  Count back from 20 0 points  Months in reverse 0 points  Repeat phrase 0 points  Total Score 0    Immunizations Immunization History  Administered Date(s) Administered  . Influenza Split 03/18/2011  . Influenza Whole 01/14/2010  . Influenza, High Dose Seasonal PF 02/15/2013, 01/26/2014, 02/16/2015, 01/16/2016, 01/01/2017, 01/03/2019, 12/30/2019  . Influenza,inj,quad, With Preservative 01/13/2018  . PFIZER SARS-COV-2 Vaccination 05/27/2019, 06/22/2019  . Pneumococcal Conjugate-13 08/24/2014  . Pneumococcal Polysaccharide-23 02/04/2010  . Td 11/08/2009  . Tdap 02/25/2019  . Zoster Recombinat (Shingrix) 01/13/2018, 03/25/2018    TDAP status: Up to date Flu Vaccine status: Up to date Pneumococcal vaccine status: Up to date Covid-19 vaccine status: Completed vaccines  Qualifies for Shingles Vaccine? Yes   Zostavax completed Yes   Shingrix Completed?: Yes  Screening Tests Health Maintenance  Topic Date Due  . Hepatitis C Screening  Never done  . COLONOSCOPY  08/01/2020  . TETANUS/TDAP  02/24/2029  . INFLUENZA VACCINE  Completed  . COVID-19 Vaccine  Completed  . PNA vac Low Risk Adult  Completed    Health Maintenance  Health Maintenance Due  Topic Date Due  . Hepatitis C Screening  Never done    Colorectal cancer screening: Completed 08/02/2015. Repeat every 5 years  Lung Cancer Screening: (Low Dose CT Chest recommended if Age 2-80 years, 30 pack-year currently smoking OR have quit w/in 15years.) does not qualify.   Lung Cancer Screening Referral: no  Additional Screening:  Hepatitis C Screening: does qualify; Completed no  Vision Screening: Recommended annual ophthalmology exams for early detection of glaucoma and other disorders of the eye. Is the patient up to date with their annual eye exam?  Yes  Who is the provider or what  is the name of the office in which the patient attends annual eye exams? [facility] If pt is not established with a provider, would they like to be referred to a provider to establish care? No .   Dental Screening: Recommended annual dental exams for proper oral hygiene  Community Resource Referral / Chronic Care Management: CRR required this visit?  No   CCM required this visit?  No      Plan:     I have personally reviewed and noted the following in the patient's chart:   . Medical and social history . Use of alcohol, tobacco or illicit drugs  . Current medications and supplements . Functional ability and status . Nutritional status . Physical activity . Advanced directives . List of other physicians . Hospitalizations, surgeries, and ER visits in previous 12 months . Vitals . Screenings to include cognitive, depression, and falls . Referrals and appointments  In addition, I have reviewed and discussed with patient certain preventive protocols, quality metrics, and best practice recommendations. A written personalized care plan for preventive services as well as general preventive health recommendations were provided to patient.     Sheral Flow, LPN   9/[id number]/[id number]   Nurse Notes: n/a

## 2020-01-11 NOTE — Assessment & Plan Note (Signed)
Chronic Sees cardiology On eliquis, metoprolol Cbc, cmp

## 2020-01-11 NOTE — Assessment & Plan Note (Signed)
Chronic Check a1c Low sugar / carb diet Stressed regular exercise  

## 2020-01-11 NOTE — Assessment & Plan Note (Signed)
Chronic Taking acyclovir daily continue

## 2020-01-11 NOTE — Assessment & Plan Note (Signed)
Chronic Check lipid panel  Continue daily statin Regular exercise and healthy diet encouraged  

## 2020-01-12 LAB — COMPLETE METABOLIC PANEL WITH GFR
AG Ratio: 1.5 (calc) (ref 1.0–2.5)
ALT: 16 U/L (ref 9–46)
AST: 20 U/L (ref 10–35)
Albumin: 4.3 g/dL (ref 3.6–5.1)
Alkaline phosphatase (APISO): 64 U/L (ref 35–144)
BUN: 17 mg/dL (ref 7–25)
CO2: 27 mmol/L (ref 20–32)
Calcium: 9.4 mg/dL (ref 8.6–10.3)
Chloride: 100 mmol/L (ref 98–110)
Creat: 1.04 mg/dL (ref 0.70–1.18)
GFR, Est African American: 79 mL/min/{1.73_m2} (ref >=60)
GFR, Est Non African American: 68 mL/min/{1.73_m2} (ref >=60)
Globulin: 2.9 g/dL (calc) (ref 1.9–3.7)
Glucose, Bld: 94 mg/dL (ref 65–99)
Potassium: 4.2 mmol/L (ref 3.5–5.3)
Sodium: 138 mmol/L (ref 135–146)
Total Bilirubin: 0.6 mg/dL (ref 0.2–1.2)
Total Protein: 7.2 g/dL (ref 6.1–8.1)

## 2020-01-12 LAB — CBC WITH DIFFERENTIAL/PLATELET
Absolute Monocytes: 647 cells/uL (ref 200–950)
Basophils Absolute: 0 cells/uL (ref 0–200)
Basophils Relative: 0 %
Eosinophils Absolute: 177 cells/uL (ref 15–500)
Eosinophils Relative: 2.3 %
HCT: 42.8 % (ref 38.5–50.0)
Hemoglobin: 14.5 g/dL (ref 13.2–17.1)
Lymphs Abs: 2179 cells/uL (ref 850–3900)
MCH: 32.7 pg (ref 27.0–33.0)
MCHC: 33.9 g/dL (ref 32.0–36.0)
MCV: 96.6 fL (ref 80.0–100.0)
MPV: 10.5 fL (ref 7.5–12.5)
Monocytes Relative: 8.4 %
Neutro Abs: 4697 cells/uL (ref 1500–7800)
Neutrophils Relative %: 61 %
Platelets: 221 10*3/uL (ref 140–400)
RBC: 4.43 10*6/uL (ref 4.20–5.80)
RDW: 11.8 % (ref 11.0–15.0)
Total Lymphocyte: 28.3 %
WBC: 7.7 10*3/uL (ref 3.8–10.8)

## 2020-01-12 LAB — HEMOGLOBIN A1C
Hgb A1c MFr Bld: 5.8 % of total Hgb — ABNORMAL HIGH (ref <5.7)
Mean Plasma Glucose: 120 (calc)
eAG (mmol/L): 6.6 (calc)

## 2020-01-12 LAB — LIPID PANEL
Cholesterol: 154 mg/dL (ref <200)
HDL: 45 mg/dL (ref >=40)
LDL Cholesterol (Calc): 86 mg/dL (calc)
Non-HDL Cholesterol (Calc): 109 mg/dL (calc) (ref <130)
Total CHOL/HDL Ratio: 3.4 (calc) (ref <5.0)
Triglycerides: 130 mg/dL (ref <150)

## 2020-01-12 LAB — TSH: TSH: 1.31 mIU/L (ref 0.40–4.50)

## 2020-01-12 LAB — HEPATITIS C ANTIBODY
Hepatitis C Ab: NONREACTIVE
SIGNAL TO CUT-OFF: 0.01 (ref <1.00)

## 2020-01-25 DIAGNOSIS — Z23 Encounter for immunization: Secondary | ICD-10-CM | POA: Diagnosis not present

## 2020-03-28 ENCOUNTER — Other Ambulatory Visit: Payer: Self-pay | Admitting: Internal Medicine

## 2020-04-03 ENCOUNTER — Other Ambulatory Visit (HOSPITAL_COMMUNITY): Payer: Self-pay | Admitting: Nurse Practitioner

## 2020-04-03 DIAGNOSIS — I480 Paroxysmal atrial fibrillation: Secondary | ICD-10-CM

## 2020-05-02 ENCOUNTER — Other Ambulatory Visit (HOSPITAL_COMMUNITY): Payer: Self-pay | Admitting: Nurse Practitioner

## 2020-07-06 ENCOUNTER — Telehealth: Payer: Self-pay | Admitting: Internal Medicine

## 2020-07-06 NOTE — Progress Notes (Signed)
  Chronic Care Management   Note  07/06/2020 Name: RONDARIUS KADRMAS MRN: [id number] DOB: May 17, 1940  Marny Lowenstein [place] is a 80 y.o. year old male who is a primary care patient of Burns, Claudina Lick, MD. I reached out to Myrene Buddy by phone today in response to a referral sent by Mr. Dalene Seltzer PCP, Binnie Rail, MD.   Mr. Mcdermid was given information about Chronic Care Management services today including:  1. CCM service includes personalized support from designated clinical staff supervised by his physician, including individualized plan of care and coordination with other care providers 2. 24/7 contact phone numbers for assistance for urgent and routine care needs. 3. Service will only be billed when office clinical staff spend 20 minutes or more in a month to coordinate care. 4. Only one practitioner may furnish and bill the service in a calendar month. 5. The patient may stop CCM services at any time (effective at the end of the month) by phone call to the office staff.   Patient agreed to services and verbal consent obtained.   Follow up plan:   Carley Perdue [employer] Scheduler

## 2020-07-06 NOTE — Progress Notes (Signed)
  Chronic Care Management   Note  07/06/2020 Name: Daniel Tucker MRN: [id number] DOB: [birth year]  Daniel Tucker is a 80 y.o. year old male who is a primary care patient of Burns, Claudina Lick, MD. I reached out to Myrene Buddy by phone today in response to a referral sent by Daniel Tucker PCP, Binnie Rail, MD.   Mr. Pratte was given information about Chronic Care Management services today including:  1. CCM service includes personalized support from designated clinical staff supervised by his physician, including individualized plan of care and coordination with other care providers 2. 24/7 contact phone numbers for assistance for urgent and routine care needs. 3. Service will only be billed when office clinical staff spend 20 minutes or more in a month to coordinate care. 4. Only one practitioner may furnish and bill the service in a calendar month. 5. The patient may stop CCM services at any time (effective at the end of the month) by phone call to the office staff.   Patient agreed to services and verbal consent obtained.   Follow up plan:   Carley Perdue [employer] Scheduler

## 2020-07-19 ENCOUNTER — Other Ambulatory Visit: Payer: Self-pay

## 2020-07-19 NOTE — Progress Notes (Signed)
Subjective:    Patient ID: Daniel Tucker, male    DOB: 1940-04-28, 80 y.o.   MRN: [id number]  HPI The patient is here for an acute visit.   Stye on lower left eyelid -  He started to have symptoms a couple of days ago.  He has used a warm compress.  It is getting worse.  The left lower lid is red and swollen.  Medications and allergies reviewed with patient and updated if appropriate.  Patient Active Problem List   Diagnosis Date Noted  . Chalazion left lower eyelid 05/18/2019  . Hordeolum externum of right lower eyelid 12/03/2018  . History of right hip replacement 06/21/2018  . Acute cystitis without hematuria 05/31/2018  . OA (osteoarthritis) of hip 05/17/2018  . Pre-op examination 05/11/2018  . Hives 10/02/2017  . Prediabetes 09/05/2015  . 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 Arthritis of right hip 04/02/2015  . Atrial fibrillation ([place]) 08/24/2014  . Obstructive sleep apnea 07/28/2012  . Nonspecific abnormal electrocardiogram (ECG) (EKG) 05/15/2011  . Hyperlipidemia 10/10/2008  . Essential hypertension 10/10/2008  . History of colonic polyps 10/10/2008  . 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 acetaminophen (TYLENOL) 500 MG tablet Take 500 mg by mouth every 6 (six) hours as needed for moderate pain or headache.     Marland Kitchen acyclovir (ZOVIRAX) 200 MG capsule TAKE 2 CAPSULES EVERY DAY 180 capsule 1  . amLODipine (NORVASC) 5 MG tablet TAKE 1 AND 1/2 TABLETS EVERY DAY 135 tablet 1  . Ascorbic Acid (VITAMIN C) 1000 MG tablet Take 1,000 mg by mouth daily.    . Cholecalciferol (VITAMIN D) 50 MCG (2000 UT) tablet Take 2,000 Units by mouth daily.    Marland Kitchen docusate sodium (COLACE) 100 MG capsule Take 100 mg by mouth 2 (two) times daily.     Marland Kitchen dofetilide (TIKOSYN) 500 MCG capsule TAKE 1 CAPSULE TWICE DAILY 180 capsule 2  . ELIQUIS 5 MG TABS tablet TAKE 1 TABLET TWICE DAILY  180 tablet 3  . GLUCOSAMINE SULFATE PO Take 2 tablets by mouth daily.     . metoprolol tartrate (LOPRESSOR) 25 MG tablet TAKE 1/2 TABLET TWICE DAILY 90 tablet 1  . Multiple Vitamin (MULTIVITAMIN) tablet Take 1 tablet by mouth daily.    . Multiple Vitamins-Minerals (PRESERVISION AREDS 2 PO) Take 1 capsule by mouth 2 (two) times daily.     . potassium chloride SA (KLOR-CON) 20 MEQ tablet TAKE 1 TABLET FOUR TIMES DAILY 360 tablet 3  . pravastatin (PRAVACHOL) 20 MG tablet TAKE 1 TABLET EVERY DAY 90 tablet 1  . amoxicillin (AMOXIL) 500 MG capsule amoxicillin 500 mg capsule  TAKE 4 CAPSULES BY MOUTH 1 HOUR PRIOR TO DENTAL PROCEDURE (Patient not taking: Reported on 07/20/2020)     No current facility-administered medications on file prior to visit.    Past Medical History:  Diagnosis Date  . Arthritis    "hips" (11/20/2014)  . Benign essential HTN   . Fasting hyperglycemia 2006   FBS 111; normal A1c  . Hepatitis ~ 1955   "yellow juandice"  . Hyperlipidemia   . Internal hemorrhoids   . Persistent atrial fibrillation ([place]) 08/24/2014   CHASD2VASC score is 3 - on Apixaban  . Sleep apnea    intolerant to CPAP and now using oral device followed by Dr. Toy Cookey  . Tubular adenoma of colon 2006   Dr Fuller Plan  Past Surgical History:  Procedure Laterality Date  . CARDIOVERSION N/A 10/12/2014   Procedure: CARDIOVERSION;  Surgeon: Darlin Coco, MD;  Location: [facility] ENDOSCOPY;  Service: Cardiovascular;  Laterality: N/A;  . COLONOSCOPY  2011   no polyp  . COLONOSCOPY W/ POLYPECTOMY  2006   [place]  . NASAL SINUS SURGERY  1997  . PROSTATE BIOPSY  2010   Negative,Dr.Ottelin  . TONSILLECTOMY AND ADENOIDECTOMY  1997  . 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 TOTAL HIP ARTHROPLASTY Right 05/17/2018   Procedure: TOTAL HIP ARTHROPLASTY ANTERIOR APPROACH;  Surgeon: Gaynelle Arabian, MD;  Location: [facility] ORS;  Service: Orthopedics;  Laterality: Right;  . UVULOPALATOPHARYNGOPLASTY  1997   Dr.Woliki     Social History   Socioeconomic History  . Marital status: Married    Spouse name: Not on file  . Number of children: 3  . Years of education: Not on file  . Highest education level: Not on file  Occupational History  . Occupation: retired   Tobacco Use  . Smoking status: Former Smoker    Packs/day: 1.00    Years: 7.00    Pack years: 7.00    Types: Cigarettes  . Smokeless tobacco: Never Used  . Tobacco comment: smoked cigarettes age [age], up to 1ppd  Vaping Use  . Vaping Use: Never used  Substance and Sexual Activity  . Alcohol use: Yes    Alcohol/week: 2.0 standard drinks    Types: 1 Glasses of wine, 1 Cans of beer per week    Comment: less than 1 drink per week  . Drug use: No  . Sexual activity: Yes  Other Topics Concern  . Not on file  Social History Narrative  . Not on file   Social Determinants of Health   Financial Resource Strain: Low Risk   . Difficulty of Paying Living Expenses: Not hard at all  Food Insecurity: Not on file  Transportation Needs: No Transportation Needs  . Lack of Transportation (Medical): No  . Lack of Transportation (Non-Medical): No  Physical Activity: Sufficiently Active  . Days of Exercise per Week: 5 days  . Minutes of Exercise per Session: 30 min  Stress: No Stress Concern Present  . Feeling of Stress : Not at all  Social Connections: Socially Integrated  . Frequency of Communication with Friends and Family: More than three times a week  . Frequency of Social Gatherings with Friends and Family: More than three times a week  . Attends Religious Services: More than 4 times per year  . Active Member of Clubs or Organizations: Yes  . Attends Archivist Meetings: More than 4 times per year  . Marital Status: Married    Family History  Problem Relation Age of Onset  . Prostate cancer Father   . Hypertension Father   . Hypertension Mother   . Prostate cancer Brother   . Melanoma Sister   . Atrial fibrillation  Brother   . Melanoma Brother   . Lupus Sister   . Diabetes Neg Hx   . Heart disease Neg Hx   . Stroke Neg Hx   . Colon cancer Neg Hx     Review of Systems  Constitutional: Negative for fever.  Eyes: Negative for pain, discharge, redness and visual disturbance.       Left lower eye lid swelling and redness only       Objective:   Vitals:   07/20/20 0922  BP: 138/72  Pulse: 65  Temp: 98.4 F (36.9 C)  SpO2: 99%   BP Readings from Last 3 Encounters:  07/20/20 138/72  01/11/20 (!) 142/78  01/11/20 (!) 142/78   Wt Readings from Last 3 Encounters:  07/20/20 185 lb (83.9 kg)  01/11/20 185 lb 6.4 oz (84.1 kg)  01/11/20 185 lb 6.4 oz (84.1 kg)   Body mass index is 28.98 kg/m.   Physical Exam Constitutional:      General: He is not in acute distress.    Appearance: Normal appearance. He is not ill-appearing.  Eyes:     General:        Right eye: No discharge.        Left eye: Hordeolum (left lower eyelid w/o discharge) present.No discharge.     Extraocular Movements: Extraocular movements intact.     Conjunctiva/sclera: Conjunctivae normal.  Neurological:     Mental Status: He is alert.            Assessment & Plan:    See Problem List for Assessment and Plan of chronic medical problems.    This visit occurred during the SARS-CoV-2 public health emergency.  Safety protocols were in place, including screening questions prior to the visit, additional usage of staff PPE, and extensive cleaning of exam room while observing appropriate contact time as indicated for disinfecting solutions.

## 2020-07-20 ENCOUNTER — Encounter: Payer: Self-pay | Admitting: Internal Medicine

## 2020-07-20 ENCOUNTER — Ambulatory Visit (INDEPENDENT_AMBULATORY_CARE_PROVIDER_SITE_OTHER): Payer: Medicare Other | Admitting: Internal Medicine

## 2020-07-20 VITALS — BP 138/72 | HR 65 | Temp 98.4°F | Ht 67.0 in | Wt 185.0 lb

## 2020-07-20 DIAGNOSIS — H0015 Chalazion left lower eyelid: Secondary | ICD-10-CM

## 2020-07-20 MED ORDER — AMOXICILLIN-POT CLAVULANATE 875-125 MG PO TABS: 1.0000 | ORAL_TABLET | Freq: Two times a day (BID) | ORAL | 0 refills | Status: AC

## 2020-07-20 MED ORDER — ERYTHROMYCIN 5 MG/GM OP OINT: 1.0000 "application " | TOPICAL_OINTMENT | Freq: Every day | OPHTHALMIC | 0 refills | Status: DC

## 2020-07-20 NOTE — Assessment & Plan Note (Signed)
Acute Has had this in the past Continue warm compresses Start erythromycin ointment at HS Augmentin 875-125 mg bid x 7 days Call if no improvement - will need to see ophthalmologist

## 2020-07-20 NOTE — Patient Instructions (Addendum)
Take the oral antibiotic and use the antibiotic ointment.   Please call if there is no improvement in your symptoms.     Stye A stye, also known as a hordeolum, is a bump that forms on an eyelid. It may look like a pimple next to the eyelash. A stye can form inside the eyelid (internal stye) or outside the eyelid (external stye). A stye can cause redness, swelling, and pain on the eyelid. Styes are very common. Anyone can get them at any age. They usually occur in just one eye, but you may have more than one in either eye. What are the causes? A stye is caused by an infection. The infection is almost always caused by bacteria called Staphylococcus aureus. This is a common type of bacteria that lives on the skin. An internal stye may result from an infected oil-producing gland inside the eyelid. An external stye may be caused by an infection at the base of the eyelash (hair follicle). What increases the risk? You are more likely to develop a stye if:  You have had a stye before.  You have any of these conditions: ? Diabetes. ? Red, itchy, inflamed eyelids (blepharitis). ? A skin condition such as seborrheic dermatitis or rosacea. ? High fat levels in your blood (lipids). What are the signs or symptoms? The most common symptom of a stye is eyelid pain. Internal styes are more painful than external styes. Other symptoms may include:  Painful swelling of your eyelid.  A scratchy feeling in your eye.  Tearing and redness of your eye.  Pus draining from the stye.   How is this diagnosed? Your health care provider may be able to diagnose a stye just by examining your eye. The health care provider may also check to make sure:  You do not have a fever or other signs of a more serious infection.  The infection has not spread to other parts of your eye or areas around your eye. How is this treated? Most styes will clear up in a few days without treatment or with warm compresses applied  to the area. You may need to use antibiotic drops or ointment to treat an infection. In some cases, if your stye does not heal with routine treatment, your health care provider may drain pus from the stye using a thin blade or needle. This may be done if the stye is large, causing a lot of pain, or affecting your vision. Follow these instructions at home:  Take over-the-counter and prescription medicines only as told by your health care provider. This includes eye drops or ointments.  If you were prescribed an antibiotic medicine, apply or use it as told by your health care provider. Do not stop using the antibiotic even if your condition improves.  Apply a warm, wet cloth (warm compress) to your eye for 5-10 minutes, 4 times a day.  Clean the affected eyelid as directed by your health care provider.  Do not wear contact lenses or eye makeup until your stye has healed.  Do not try to pop or drain the stye.  Do not rub your eye. Contact a health care provider if:  You have chills or a fever.  Your stye does not go away after several days.  Your stye affects your vision.  Your eyeball becomes swollen, red, or painful. Get help right away if:  You have pain when moving your eye around. Summary  A stye is a bump that forms on an  eyelid. It may look like a pimple next to the eyelash.  A stye can form inside the eyelid (internal stye) or outside the eyelid (external stye). A stye can cause redness, swelling, and pain on the eyelid.  Your health care provider may be able to diagnose a stye just by examining your eye.  Apply a warm, wet cloth (warm compress) to your eye for 5-10 minutes, 4 times a day. This information is not intended to replace advice given to you by your health care provider. Make sure you discuss any questions you have with your health care provider. Document Revised: 12/15/2019 Document Reviewed: 12/15/2019 Elsevier Patient Education  [place].

## 2020-08-08 ENCOUNTER — Other Ambulatory Visit (HOSPITAL_COMMUNITY): Payer: Self-pay | Admitting: Nurse Practitioner

## 2020-08-31 ENCOUNTER — Other Ambulatory Visit: Payer: Self-pay | Admitting: Internal Medicine

## 2020-08-31 ENCOUNTER — Other Ambulatory Visit (HOSPITAL_COMMUNITY): Payer: Self-pay | Admitting: Nurse Practitioner

## 2020-08-31 DIAGNOSIS — I480 Paroxysmal atrial fibrillation: Secondary | ICD-10-CM

## 2020-09-03 ENCOUNTER — Telehealth: Payer: Self-pay | Admitting: Pharmacist

## 2020-09-03 NOTE — Progress Notes (Signed)
Chronic Care Management Pharmacy Assistant   Name: JVON MERONEY  MRN: [id number] DOB: Oct 05, 1940   Reason for Encounter: Initial Questions Appointment: OV 09/04/20 @ 11 am    Recent office visits:  07/20/20 Burns (PCP) - Chalazion left lower eyelid. Start Amoxicillin & Erythromycin.  01/11/20 Burns (PCP) - Hypertension. No med changes. F/u 6 mo.  11/30/19 Valere Dross, [place] (PCP) - Eye pain. Start Tobramycin 0.3%.  Recent consult visits:  12/15/19 Kayleen Memos, [place] (Cardiology) - Paroxysmal atrial fibrillation,Secondary hypercoagulable state.   Hospital visits:  None in previous 6 months  Medications: Outpatient Encounter Medications as of 09/03/2020  Medication Sig  . acetaminophen (TYLENOL) 500 MG tablet Take 500 mg by mouth every 6 (six) hours as needed for moderate pain or headache.   Marland Kitchen acyclovir (ZOVIRAX) 200 MG capsule TAKE 2 CAPSULES EVERY DAY  . amLODipine (NORVASC) 5 MG tablet TAKE 1 AND 1/2 TABLETS EVERY DAY  . amoxicillin (AMOXIL) 500 MG capsule amoxicillin 500 mg capsule  TAKE 4 CAPSULES BY MOUTH 1 HOUR PRIOR TO DENTAL PROCEDURE (Patient not taking: Reported on 07/20/2020)  . Ascorbic Acid (VITAMIN C) 1000 MG tablet Take 1,000 mg by mouth daily.  . Cholecalciferol (VITAMIN D) 50 MCG (2000 UT) tablet Take 2,000 Units by mouth daily.  Marland Kitchen docusate sodium (COLACE) 100 MG capsule Take 100 mg by mouth 2 (two) times daily.   Marland Kitchen dofetilide (TIKOSYN) 500 MCG capsule TAKE 1 CAPSULE TWICE DAILY  . ELIQUIS 5 MG TABS tablet TAKE 1 TABLET TWICE DAILY  . erythromycin ophthalmic ointment Place 1 application into the left eye at bedtime.  Marland Kitchen GLUCOSAMINE SULFATE PO Take 2 tablets by mouth daily.   . metoprolol tartrate (LOPRESSOR) 25 MG tablet TAKE 1/2 TABLET TWICE DAILY  . Multiple Vitamin (MULTIVITAMIN) tablet Take 1 tablet by mouth daily.  . Multiple Vitamins-Minerals (PRESERVISION AREDS 2 PO) Take 1 capsule by mouth 2 (two) times daily.   . potassium chloride SA (KLOR-CON) 20 MEQ tablet TAKE  1 TABLET FOUR TIMES DAILY  . pravastatin (PRAVACHOL) 20 MG tablet TAKE 1 TABLET EVERY DAY   No facility-administered encounter medications on file as of 09/03/2020.    Have you seen any other providers since your last visit? Patient states he has not seen any other providers.  Any changes in your medications or health?  Patient states no recent changes.  Any side effects from any medications?  Patient states no side effects from any meds.  Do you have an symptoms or problems not managed by your medications?  Patient states no problems at this time.  Any concerns about your health right now?  Patient states no concerns at this time.  Has your provider asked that you check blood pressure, blood sugar, or follow special diet at home? Patient states he does check his BP occasionally but does not record his readings. He does not follow a special diet or check his glucose.  Do you get any type of exercise on a regular basis?  Patient states he does do some walking and he loves working in his yard and gardening.  Can you think of a goal you would like to reach for your health?  Patient states no goals at this time.  Do you have any problems getting your medications?  Patient states there's no problems getting meds.   Is there anything that you would like to discuss during the appointment?  Patient states no concerns at this time.  Please bring medications and supplements to appointment!  Star Rating Drugs: Pravastatin - 04/18/20 Patient states pharmacy said a refill is needed.  Orinda Kenner, [place] Clinical Pharmacists Assistant [phone]  Time Spent: [phone]

## 2020-09-04 ENCOUNTER — Ambulatory Visit (INDEPENDENT_AMBULATORY_CARE_PROVIDER_SITE_OTHER): Payer: Medicare Other | Admitting: Pharmacist

## 2020-09-04 ENCOUNTER — Other Ambulatory Visit: Payer: Self-pay

## 2020-09-04 DIAGNOSIS — I4891 Unspecified atrial fibrillation: Secondary | ICD-10-CM

## 2020-09-04 DIAGNOSIS — E782 Mixed hyperlipidemia: Secondary | ICD-10-CM

## 2020-09-04 DIAGNOSIS — I10 Essential (primary) hypertension: Secondary | ICD-10-CM

## 2020-09-04 NOTE — Progress Notes (Signed)
Chronic Care Management Pharmacy Note  09/07/2020 Name:  Daniel Tucker MRN:  [id number] DOB:  1940-06-28  Subjective: Daniel Tucker is an 80 y.o. year old male who is a primary patient of Burns, Claudina Lick, MD.  The CCM team was consulted for assistance with disease management and care coordination needs.    Engaged with patient face to face for initial visit in response to provider referral for pharmacy case management and/or care coordination services.   Consent to Services:  The patient was given the following information about Chronic Care Management services today, agreed to services, and gave verbal consent: 1. CCM service includes personalized support from designated clinical staff supervised by the primary care provider, including individualized plan of care and coordination with other care providers 2. 24/7 contact phone numbers for assistance for urgent and routine care needs. 3. Service will only be billed when office clinical staff spend 20 minutes or more in a month to coordinate care. 4. Only one practitioner may furnish and bill the service in a calendar month. 5.The patient may stop CCM services at any time (effective at the end of the month) by phone call to the office staff. 6. The patient will be responsible for cost sharing (co-pay) of up to 20% of the service fee (after annual deductible is met). Patient agreed to services and consent obtained.  Patient Care Team: Binnie Rail, MD as PCP - General (Internal Medicine) Gaynelle Arabian, MD as Consulting Physician (Orthopedic Surgery) Freada Bergeron, CMA as Technician Melida Quitter, MD as Consulting Physician (Otolaryngology) Ladene Artist, MD as Consulting Physician (Gastroenterology) Thompson Grayer, MD as Consulting Physician (Cardiology) Charlton Haws, [facility] as Pharmacist (Pharmacist)  Patient lives at home with his wife.  Recent office visits: 07/20/20 Burns (PCP) - Chalazion left lower eyelid. Start  Amoxicillin & Erythromycin.  01/11/20 Burns (PCP) - Hypertension. No med changes. F/u 6 mo.  11/30/19 Valere Dross, [place] (PCP) - Eye pain. Start Tobramycin 0.3%.  Recent consult visits: 12/15/19 Kayleen Memos, [place] (Cardiology) - Paroxysmal atrial fibrillation,Secondary hypercoagulable state.  Hospital visits: None in previous 6 months  Objective:  Lab Results  Component Value Date   CREATININE 1.04 01/11/2020   BUN 17 01/11/2020   GFR 69.83 07/11/2019   GFRNONAA 68 01/11/2020   GFRAA 79 01/11/2020   NA 138 01/11/2020   K 4.2 01/11/2020   CALCIUM 9.4 01/11/2020   CO2 27 01/11/2020   GLUCOSE 94 01/11/2020    Lab Results  Component Value Date/Time   HGBA1C 5.8 (H) 01/11/2020 11:40 AM   HGBA1C 5.9 07/11/2019 09:52 AM   GFR 69.83 07/11/2019 09:52 AM   GFR 65.61 05/11/2018 08:08 AM    Last diabetic Eye exam: No results found for: HMDIABEYEEXA  Last diabetic Foot exam: No results found for: HMDIABFOOTEX   Lab Results  Component Value Date   CHOL 154 01/11/2020   HDL 45 01/11/2020   LDLCALC 86 01/11/2020   TRIG 130 01/11/2020   CHOLHDL 3.4 01/11/2020    Hepatic Function Latest Ref Rng & Units 01/11/2020 07/11/2019 05/11/2018  Total Protein 6.1 - 8.1 g/dL 7.2 7.3 7.6  Albumin 3.5 - 5.2 g/dL - 4.3 4.5  AST 10 - 35 U/L _0 ALT 9 - 46 U/L _1 Alk Phosphatase 39 - 117 U/L - 73 71  Total Bilirubin 0.2 - 1.2 mg/dL 0.6 0.7 0.8  Bilirubin, Direct 0.0 - 0.3 mg/dL - - -    Lab Results  Component Value Date/Time   TSH 1.31 01/11/2020 11:40 AM   TSH 1.08 07/11/2019 09:52 AM   FREET4 0.83 08/24/2014 10:42 AM    CBC Latest Ref Rng & Units 01/11/2020 06/17/2019 06/17/2018  WBC 3.8 - 10.8 Thousand/uL 7.7 5.2 8.0  Hemoglobin 13.2 - 17.1 g/dL 14.5 14.8 13.4  Hematocrit 38.5 - 50.0 % 42.8 46.1 41.6  Platelets 140 - 400 Thousand/uL 221 216 315    No results found for: VD25OH  Clinical ASCVD: No  The 10-year ASCVD risk score Mikey Bussing DC Jr., et al., 2013) is: 38.5%   Values used to  calculate the score:     Age: 21 years     Sex: Male     Is Non-Hispanic African American: No     Diabetic: No     Tobacco smoker: No     Systolic Blood Pressure: 983 mmHg     Is BP treated: Yes     HDL Cholesterol: 45 mg/dL     Total Cholesterol: 154 mg/dL    CHA2DS2-VASc Score = 3  The patient's score is based upon: CHF History: No HTN History: Yes Diabetes History: No Stroke History: No Vascular Disease History: No Age Score: 2 Gender Score: 0      Depression screen [facility] 2/9 01/11/2020 05/18/2019 05/11/2018  Decreased Interest 0 0 0  Down, Depressed, Hopeless 0 0 0  PHQ - 2 Score 0 0 0     Social History   Tobacco Use  Smoking Status Former Smoker  . Packs/day: 1.00  . Years: 7.00  . Pack years: 7.00  . Types: Cigarettes  Smokeless Tobacco Never Used  Tobacco Comment   smoked cigarettes age [age], up to 1ppd   BP Readings from Last 3 Encounters:  07/20/20 138/72  01/11/20 (!) 142/78  01/11/20 (!) 142/78   Pulse Readings from Last 3 Encounters:  07/20/20 65  01/11/20 (!) 58  01/11/20 (!) 58   Wt Readings from Last 3 Encounters:  07/20/20 185 lb (83.9 kg)  01/11/20 185 lb 6.4 oz (84.1 kg)  01/11/20 185 lb 6.4 oz (84.1 kg)   BMI Readings from Last 3 Encounters:  07/20/20 28.98 kg/m  01/11/20 29.04 kg/m  01/11/20 29.04 kg/m    Assessment/Interventions: Review of patient past medical history, allergies, medications, health status, including review of consultants reports, laboratory and other test data, was performed as part of comprehensive evaluation and provision of chronic care management services.   SDOH:  (Social Determinants of Health) assessments and interventions performed: Yes SDOH Interventions   Flowsheet Row Most Recent Value  SDOH Interventions   Financial Strain Interventions Other (Comment)  [attempt tier exception for dofetilide]     SDOH Screenings   Alcohol Screen: Low Risk   . Last Alcohol Screening Score (AUDIT): 3   Depression (PHQ2-9): Low Risk   . PHQ-2 Score: 0  Financial Resource Strain: Low Risk   . Difficulty of Paying Living Expenses: Not very hard  Food Insecurity: Not on file  Housing: Not on file  Physical Activity: Sufficiently Active  . Days of Exercise per Week: 5 days  . Minutes of Exercise per Session: 30 min  Social Connections: Socially Integrated  . Frequency of Communication with Friends and Family: More than three times a week  . Frequency of Social Gatherings with Friends and Family: More than three times a week  . Attends Religious Services: More than 4 times per year  . Active Member of Clubs or Organizations: Yes  . Attends  Club or Organization Meetings: More than 4 times per year  . Marital Status: Married  Stress: No Stress Concern Present  . Feeling of Stress : Not at all  Tobacco Use: Medium Risk  . Smoking Tobacco Use: Former Smoker  . Smokeless Tobacco Use: Never Used  Transportation Needs: No Transportation Needs  . Lack of Transportation (Medical): No  . Lack of Transportation (Non-Medical): No    CCM Care Plan  No Known Allergies  Medications Reviewed Today    Reviewed by Charlton Haws, [facility] (Pharmacist) on 09/04/20 at 1509  Med List Status: <None>  Medication Order Taking? Sig Documenting Provider Last Dose Status Informant  acetaminophen (TYLENOL) 500 MG tablet [id number] Yes Take 500 mg by mouth every 6 (six) hours as needed for moderate pain or headache.  [provider] Taking Active Self  acyclovir (ZOVIRAX) 200 MG capsule [id number] Yes TAKE 2 CAPSULES EVERY DAY Burns, Claudina Lick, MD Taking Active   amLODipine (NORVASC) 5 MG tablet [id number] Yes TAKE 1 AND 1/2 TABLETS EVERY DAY Burns, Claudina Lick, MD Taking Active   Ascorbic Acid (VITAMIN C) 1000 MG tablet [id number] Yes Take 1,000 mg by mouth daily. [provider] Taking Active Self  Cholecalciferol (VITAMIN D) 50 MCG (2000 UT) tablet [id number] Yes Take 2,000 Units by mouth daily.  [provider] Taking Active   docusate sodium (COLACE) 100 MG capsule [id number] Yes Take 100 mg by mouth 2 (two) times daily.  [provider] Taking Active Self  dofetilide (TIKOSYN) 500 MCG capsule [id number] Yes TAKE 1 CAPSULE TWICE DAILY Sherran Needs, NP Taking Active   ELIQUIS 5 MG TABS tablet [id number] Yes TAKE 1 TABLET TWICE DAILY Sherran Needs, NP Taking Active   GLUCOSAMINE SULFATE PO [id number] Yes Take 2 tablets by mouth daily.  [provider] Taking Active Self  metoprolol tartrate (LOPRESSOR) 25 MG tablet [id number] Yes TAKE 1/2 TABLET TWICE DAILY Sherran Needs, NP Taking Active   Multiple Vitamin (MULTIVITAMIN) tablet [id number] Yes Take 1 tablet by mouth daily. [provider] Taking Active Self  Multiple Vitamins-Minerals (PRESERVISION AREDS 2 PO) [id number] Yes Take 1 capsule by mouth 2 (two) times daily.  [provider] Taking Active Self  potassium chloride SA (KLOR-CON) 20 MEQ tablet [id number] Yes TAKE 1 TABLET FOUR TIMES DAILY  Patient taking differently: TAKE 2 TABLETS TWO TIMES DAILY   Sherran Needs, NP Taking Active   pravastatin (PRAVACHOL) 20 MG tablet [id number] Yes TAKE 1 TABLET EVERY DAY Burns, Claudina Lick, MD Taking Active           Patient Active Problem List   Diagnosis Date Noted  . Chalazion left lower eyelid 05/18/2019  . Hordeolum externum of right lower eyelid 12/03/2018  . History of right hip replacement 06/21/2018  . Acute cystitis without hematuria 05/31/2018  . OA (osteoarthritis) of hip 05/17/2018  . Pre-op examination 05/11/2018  . Hives 10/02/2017  . Prediabetes 09/05/2015  . 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 Arthritis of right hip 04/02/2015  . Atrial fibrillation ([place]) 08/24/2014  . Obstructive sleep apnea 07/28/2012  . Nonspecific abnormal electrocardiogram (ECG) (EKG) 05/15/2011  . Hyperlipidemia 10/10/2008  . Essential hypertension 10/10/2008  . History of colonic polyps  10/10/2008  . 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 Influenza Split 03/18/2011  . Influenza Whole 01/14/2010  . Influenza, High Dose Seasonal PF 02/15/2013, 01/26/2014, 02/16/2015, 01/16/2016, 01/01/2017, 01/03/2019, 12/30/2019  . Influenza,inj,quad,  With Preservative 01/13/2018  . PFIZER(Purple Top)SARS-COV-2 Vaccination 05/27/2019, 06/22/2019, 01/25/2020  . Pneumococcal Conjugate-13 08/24/2014  . Pneumococcal Polysaccharide-23 02/04/2010  . Td 11/08/2009  . Tdap 02/25/2019  . Zoster Recombinat (Shingrix) 01/13/2018, 03/25/2018    Conditions to be addressed/monitored:  Hypertension, Hyperlipidemia and Atrial Fibrillation  Care Plan : CCM Pharmacy Care Plan  Updates made by Charlton Haws, [place] since 09/07/2020 12:00 AM    Problem: Hypertension, Hyperlipidemia and Atrial Fibrillation   Priority: High    Long-Range Goal: Disease management   Start Date: 09/04/2020  Expected End Date: 09/07/2021  This Visit's Progress: On track  Priority: High  Note:   Current Barriers:  . Unable to independently monitor therapeutic efficacy  Pharmacist Clinical Goal(s):  Marland Kitchen Patient will achieve adherence to monitoring guidelines and medication adherence to achieve therapeutic efficacy through collaboration with PharmD and provider.   Interventions: . 1:1 collaboration with Binnie Rail, MD regarding development and update of comprehensive plan of care as evidenced by provider attestation and co-signature . Inter-disciplinary care team collaboration (see longitudinal plan of care) . Comprehensive medication review performed; medication list updated in electronic medical record  Hypertension (BP goal <140/90) -Controlled - BP is at goal at home; pt reports compliance and denies side effects -Current treatment: . Amlodipine 5 mg - 1.5 tab daily . Metoprolol tartrate 25 mg - 1/2 tab BID -Current home readings:  130s/70s -Current exercise habits: yard work, walking -Denies hypotensive/hypertensive symptoms -Educated on BP goals and benefits of medications for prevention of heart attack, stroke and kidney damage; Daily salt intake goal < 2300 mg; Exercise goal of 150 minutes per week; -Counseled to monitor BP at home 3x a week, document, and provide log at future appointments -Recommended to continue current medication  Hyperlipidemia: (LDL goal < 100) -Controlled - LDL is at goal; pt endorses compliance and denies side effects -Current treatment: . Pravastatin 20 mg daily -Educated on Cholesterol goals;  Benefits of statin for ASCVD risk reduction; -Recommended to continue current medication  Atrial Fibrillation (Goal: prevent stroke and major bleeding) -Controlled - pt reports minimal Afib symptoms; he reports compliance with meds; denies bleeding issues with Eliquis -CHADSVASC: 3 -Current treatment: . Rate control: Metoprolol tartrate 25 mg - 1/2 tab BID, dofetilide 500 mg BID . Anticoagulation: Eliquis 5 mg BID -Counseled on increased risk of stroke due to Afib and benefits of anticoagulation for stroke prevention; importance of adherence to anticoagulant exactly as prescribed; bleeding risk associated with Eliquis and importance of self-monitoring for signs/symptoms of bleeding; avoidance of NSAIDs due to increased bleeding risk with anticoagulants; -Counseled on importance of not missing doses of dofetilide; >3 missed doses requires hospitalization to restart the drug -Assessed patient finances - he reports Eliquis and dofetilide copays are high; he does not think he qualify for Eliquis PAP due to income; however he may qualify for dofetilide tier exception based on  -Recommended to continue current medication  -Attempt tier exception for dofetilide  Oral herpes simplex (Goal: prevent flares) -Controlled - pt has been on suppressive therapy for many years and reports cold sore breakthrough  is rare -Current treatment  . Acyclovir 200 mg - 2 cap daily -Recommended to continue current medication  Health Maintenance -Vaccine gaps: none -Current therapy:  . Docusate 100 mg BID . Glucosamine . Vitamin C . Vitamin D 2000 IU . Multivitamin . Preservision Areds 2 . Klor-Con 20 mEq QID . Erythromycin eye ointment . Tylenol 500 mg -Educated on Herbal supplement research is limited and  benefits usually cannot be proven -Patient is satisfied with current therapy and denies issues -Recommended to continue current medication  Patient Goals/Self-Care Activities . Patient will:  - take medications as prescribed focus on medication adherence by pill box check blood pressure 3x a week, document, and provide at future appointments collaborate with provider on medication access solutions  Follow Up Plan: Telephone follow up appointment with care management team member scheduled for: 6 months      Medication Assistance: None required.  Patient affirms current coverage meets needs.  Patient's preferred pharmacy is:  [facility] [address] [place] - [address] [place] [place] [address] Phone: [phone] Fax: [place] [facility] [place], [place] [address] Phone: [phone] Fax: [phone]  Uses pill box? Yes Pt endorses 100% compliance  We discussed: Current pharmacy is preferred with insurance plan and patient is satisfied with pharmacy services Patient decided to: Continue current medication management strategy  Care Plan and Follow Up Patient Decision:  Patient agrees to Care Plan and Follow-up.  Plan: Telephone follow up appointment with care management team member scheduled for:  6 months  Charlene Brooke, PharmD, [place], CPP Clinical Pharmacist [facility] [phone]

## 2020-09-07 NOTE — Patient Instructions (Addendum)
Visit Information  Phone number for Pharmacist: [phone]  Thank you for meeting with me to discuss your medications! I look forward to working with you to achieve your health care goals. Below is a summary of what we talked about during the visit:  Goals Addressed            This Visit's Progress   . Manage My Medicine       Timeframe:  Long-Range Goal Priority:  Medium Start Date:      09/04/20                       Expected End Date:  09/04/21                     Follow Up Date 03/20/21   - call for medicine refill 2 or 3 days before it runs out - call if I am sick and can't take my medicine - keep a list of all the medicines I take; vitamins and herbals too - use a pillbox to sort medicine    Why is this important?   . These steps will help you keep on track with your medicines.   Notes:        Daniel Tucker was given information about Chronic Care Management services today including:  1. CCM service includes personalized support from designated clinical staff supervised by his physician, including individualized plan of care and coordination with other care providers 2. 24/7 contact phone numbers for assistance for urgent and routine care needs. 3. Standard insurance, coinsurance, copays and deductibles apply for chronic care management only during months in which we provide at least 20 minutes of these services. Most insurances cover these services at 100%, however patients may be responsible for any copay, coinsurance and/or deductible if applicable. This service may help you avoid the need for more expensive face-to-face services. 4. Only one practitioner may furnish and bill the service in a calendar month. 5. The patient may stop CCM services at any time (effective at the end of the month) by phone call to the office staff.  Patient agreed to services and verbal consent obtained.   Patient verbalizes understanding of instructions provided today and agrees to view in  [place].  Telephone follow up appointment with pharmacy team member scheduled for: 6 months  Charlene Brooke, PharmD, Para [place], CPP Clinical Pharmacist [facility] at [facility] [phone]  Heart-Healthy Eating Plan Many factors influence your heart (coronary) health, including eating and exercise habits. Coronary risk increases with abnormal blood fat (lipid) levels. Heart-healthy meal planning includes limiting unhealthy fats, increasing healthy fats, and making other diet and lifestyle changes. What is my plan? Your health care provider may recommend that you:  Limit your fat intake to _________% or less of your total calories each day.  Limit your saturated fat intake to _________% or less of your total calories each day.  Limit the amount of cholesterol in your diet to less than _________ mg per day. What are tips for following this plan? Cooking Cook foods using methods other than frying. Baking, boiling, grilling, and broiling are all good options. Other ways to reduce fat include:  Removing the skin from poultry.  Removing all visible fats from meats.  Steaming vegetables in water or broth. Meal planning  At meals, imagine dividing your plate into fourths: ? Fill one-half of your plate with vegetables and green salads. ? Fill one-fourth of your plate with whole grains. ?  Fill one-fourth of your plate with lean protein foods.  Eat 4-5 servings of vegetables per day. One serving equals 1 cup raw or cooked vegetable, or 2 cups raw leafy greens.  Eat 4-5 servings of fruit per day. One serving equals 1 medium whole fruit,  cup dried fruit,  cup fresh, frozen, or canned fruit, or  cup 100% fruit juice.  Eat more foods that contain soluble fiber. Examples include apples, broccoli, carrots, beans, peas, and barley. Aim to get 25-30 g of fiber per day.  Increase your consumption of legumes, nuts, and seeds to 4-5 servings per week. One serving of dried beans or  legumes equals  cup cooked, 1 serving of nuts is  cup, and 1 serving of seeds equals 1 tablespoon.   Fats  Choose healthy fats more often. Choose monounsaturated and polyunsaturated fats, such as olive and canola oils, flaxseeds, walnuts, almonds, and seeds.  Eat more omega-3 fats. Choose salmon, mackerel, sardines, tuna, flaxseed oil, and ground flaxseeds. Aim to eat fish at least 2 times each week.  Check food labels carefully to identify foods with trans fats or high amounts of saturated fat.  Limit saturated fats. These are found in animal products, such as meats, butter, and cream. Plant sources of saturated fats include palm oil, palm kernel oil, and coconut oil.  Avoid foods with partially hydrogenated oils in them. These contain trans fats. Examples are stick margarine, some tub margarines, cookies, crackers, and other baked goods.  Avoid fried foods. General information  Eat more home-cooked food and less restaurant, buffet, and fast food.  Limit or avoid alcohol.  Limit foods that are high in starch and sugar.  Lose weight if you are overweight. Losing just 5-10% of your body weight can help your overall health and prevent diseases such as diabetes and heart disease.  Monitor your salt (sodium) intake, especially if you have high blood pressure. Talk with your health care provider about your sodium intake.  Try to incorporate more vegetarian meals weekly. What foods can I eat? Fruits All fresh, canned (in natural juice), or frozen fruits. Vegetables Fresh or frozen vegetables (raw, steamed, roasted, or grilled). Green salads. Grains Most grains. Choose whole wheat and whole grains most of the time. Rice and pasta, including brown rice and pastas made with whole wheat. Meats and other proteins Lean, well-trimmed beef, veal, pork, and lamb. Chicken and [place] without skin. All fish and shellfish. Wild duck, rabbit, pheasant, and venison. Egg whites or low-cholesterol egg  substitutes. Dried beans, peas, lentils, and tofu. Seeds and most nuts. Dairy Low-fat or nonfat cheeses, including ricotta and mozzarella. Skim or 1% milk (liquid, powdered, or evaporated). Buttermilk made with low-fat milk. Nonfat or low-fat yogurt. Fats and oils Non-hydrogenated (trans-free) margarines. Vegetable oils, including soybean, sesame, sunflower, olive, peanut, safflower, corn, canola, and cottonseed. Salad dressings or mayonnaise made with a vegetable oil. Beverages Water (mineral or sparkling). Coffee and tea. Diet carbonated beverages. Sweets and desserts Sherbet, gelatin, and fruit ice. Small amounts of dark chocolate. Limit all sweets and desserts. Seasonings and condiments All seasonings and condiments. The items listed above may not be a complete list of foods and beverages you can eat. Contact a dietitian for more options. What foods are not recommended? Fruits Canned fruit in heavy syrup. Fruit in cream or butter sauce. Fried fruit. Limit coconut. Vegetables Vegetables cooked in cheese, cream, or butter sauce. Fried vegetables. Grains Breads made with saturated or trans fats, oils, or whole milk. Croissants. Sweet  rolls. Donuts. High-fat crackers, such as cheese crackers. Meats and other proteins Fatty meats, such as hot dogs, ribs, sausage, bacon, rib-eye roast or steak. High-fat deli meats, such as salami and bologna. Caviar. Domestic duck and goose. Organ meats, such as liver. Dairy Cream, sour cream, cream cheese, and creamed cottage cheese. Whole milk cheeses. Whole or 2% milk (liquid, evaporated, or condensed). Whole buttermilk. Cream sauce or high-fat cheese sauce. Whole-milk yogurt. Fats and oils Meat fat, or shortening. Cocoa butter, hydrogenated oils, palm oil, coconut oil, palm kernel oil. Solid fats and shortenings, including bacon fat, salt pork, lard, and butter. Nondairy cream substitutes. Salad dressings with cheese or sour cream. Beverages Regular  sodas and any drinks with added sugar. Sweets and desserts Frosting. Pudding. Cookies. Cakes. Pies. Milk chocolate or white chocolate. Buttered syrups. Full-fat ice cream or ice cream drinks. The items listed above may not be a complete list of foods and beverages to avoid. Contact a dietitian for more information. Summary  Heart-healthy meal planning includes limiting unhealthy fats, increasing healthy fats, and making other diet and lifestyle changes.  Lose weight if you are overweight. Losing just 5-10% of your body weight can help your overall health and prevent diseases such as diabetes and heart disease.  Focus on eating a balance of foods, including fruits and vegetables, low-fat or nonfat dairy, lean protein, nuts and legumes, whole grains, and heart-healthy oils and fats. This information is not intended to replace advice given to you by your health care provider. Make sure you discuss any questions you have with your health care provider. Document Revised: 05/15/2017 Document Reviewed: 05/15/2017 Elsevier Patient Education  2021 [employer].

## 2020-09-19 ENCOUNTER — Other Ambulatory Visit (HOSPITAL_COMMUNITY): Payer: Self-pay | Admitting: Nurse Practitioner

## 2020-09-24 DIAGNOSIS — Z23 Encounter for immunization: Secondary | ICD-10-CM | POA: Diagnosis not present

## 2020-10-03 ENCOUNTER — Other Ambulatory Visit: Payer: Self-pay

## 2020-10-03 ENCOUNTER — Ambulatory Visit (HOSPITAL_COMMUNITY)
Admission: RE | Admit: 2020-10-03 | Discharge: 2020-10-03 | Disposition: A | Discharge status: Home / Self Care | Payer: Medicare Other | Source: Ambulatory Visit | Attending: Nurse Practitioner | Admitting: Nurse Practitioner

## 2020-10-03 ENCOUNTER — Encounter (HOSPITAL_COMMUNITY): Payer: Self-pay | Admitting: Nurse Practitioner

## 2020-10-03 VITALS — BP 146/72 | HR 59 | Ht 67.0 in | Wt 176.6 lb

## 2020-10-03 DIAGNOSIS — I4510 Unspecified right bundle-branch block: Secondary | ICD-10-CM | POA: Insufficient documentation

## 2020-10-03 DIAGNOSIS — I480 Paroxysmal atrial fibrillation: Secondary | ICD-10-CM

## 2020-10-03 DIAGNOSIS — R001 Bradycardia, unspecified: Secondary | ICD-10-CM | POA: Insufficient documentation

## 2020-10-03 DIAGNOSIS — D6869 Other thrombophilia: Secondary | ICD-10-CM

## 2020-10-03 DIAGNOSIS — G4733 Obstructive sleep apnea (adult) (pediatric): Secondary | ICD-10-CM | POA: Insufficient documentation

## 2020-10-03 DIAGNOSIS — I119 Hypertensive heart disease without heart failure: Secondary | ICD-10-CM | POA: Diagnosis not present

## 2020-10-03 DIAGNOSIS — Z79899 Other long term (current) drug therapy: Secondary | ICD-10-CM | POA: Diagnosis not present

## 2020-10-03 DIAGNOSIS — E785 Hyperlipidemia, unspecified: Secondary | ICD-10-CM | POA: Diagnosis not present

## 2020-10-03 DIAGNOSIS — Z87891 Personal history of nicotine dependence: Secondary | ICD-10-CM | POA: Diagnosis not present

## 2020-10-03 DIAGNOSIS — Z8249 Family history of ischemic heart disease and other diseases of the circulatory system: Secondary | ICD-10-CM | POA: Insufficient documentation

## 2020-10-03 DIAGNOSIS — Z7901 Long term (current) use of anticoagulants: Secondary | ICD-10-CM | POA: Diagnosis not present

## 2020-10-03 LAB — MAGNESIUM: Magnesium: 2.4 mg/dL (ref 1.7–2.4)

## 2020-10-03 LAB — BASIC METABOLIC PANEL
Anion gap: 7 (ref 5–15)
BUN: 19 mg/dL (ref 8–23)
CO2: 28 mmol/L (ref 22–32)
Calcium: 8.9 mg/dL (ref 8.9–10.3)
Chloride: 101 mmol/L (ref 98–111)
Creatinine, Ser: 1.04 mg/dL (ref 0.61–1.24)
GFR, Estimated: >60 mL/min (ref >60)
Glucose, Bld: 110 mg/dL — ABNORMAL HIGH (ref 70–99)
Potassium: 3.9 mmol/L (ref 3.5–5.1)
Sodium: 136 mmol/L (ref 135–145)

## 2020-10-03 NOTE — Progress Notes (Signed)
Patient ID: Daniel Tucker, male   DOB: 03-19-1941, 80 y.o.   MRN: [id number]     Primary Care Physician: Binnie Rail, MD Referring Physician: Dr. Jovita Kussmaul is a 80 y.o. male with a h/o paroxysmal afib maintaining SR on tikosyn. He is in the afib clinic for f/u surveillance of Tikosyn. He did go thru a hip replacement in February 2020 without any issues. Short episode  afib lst week that responded well to extra dose of BB. Pending 2nd covid shot 3/3. Qtc is stable.   F/u in the afib clinic, 8/26, for surveillance ofTikosyn. He forgot his Tikosyn a couple of weeks ago and did have breakthrough afib.He did convert the next day. No further  afib.   F/u in afib clinic 10/03/20. He states that he has been staying in [place]. Rare episode of afib that resolves with extra metoprolol. He has lost some weight as he got a new puppy and is walking outside with it frequently. No issues with anticoagulation.   Today, he denies symptoms of palpitations, chest pain, shortness of breath, orthopnea, PND, lower extremity edema, dizziness, presyncope, syncope, or neurologic sequela. The patient is tolerating medications without difficulties and is otherwise without complaint today.   Past Medical History:  Diagnosis Date   Arthritis    "hips" (11/20/2014)   Benign essential HTN    Fasting hyperglycemia 2006   FBS 111; normal A1c   Hepatitis ~ 1955   "yellow juandice"   Hyperlipidemia    Internal hemorrhoids    Persistent atrial fibrillation ([place]) 08/24/2014   CHASD2VASC score is 3 - on Apixaban   Sleep apnea    intolerant to CPAP and now using oral device followed by Dr. 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      acyclovir (ZOVIRAX) 200 MG capsule TAKE 2 CAPSULES EVERY DAY 180 capsule 1   amLODipine (NORVASC) 5 MG tablet TAKE 1 AND 1/2 TABLETS EVERY DAY 135 tablet 1   Ascorbic Acid (VITAMIN C) 1000 MG tablet Take 1,000 mg by mouth daily.     Cholecalciferol (VITAMIN D) 50 MCG (2000 UT) tablet Take 2,000 Units by mouth daily.     docusate sodium (COLACE) 100 MG capsule Take 100 mg by mouth 2 (two) times daily.      dofetilide (TIKOSYN) 500 MCG capsule TAKE 1 CAPSULE TWICE DAILY 180 capsule 2   ELIQUIS 5 MG TABS tablet TAKE 1 TABLET TWICE DAILY 180 tablet 3   GLUCOSAMINE SULFATE PO Take 2 tablets by mouth  daily.      metoprolol tartrate (LOPRESSOR) 25 MG tablet TAKE 1/2 TABLET TWICE DAILY 90 tablet 1   Multiple Vitamin (MULTIVITAMIN) tablet Take 1 tablet by mouth daily.     Multiple Vitamins-Minerals (PRESERVISION AREDS 2 PO) Take 1 capsule by mouth 2 (two) times daily.      potassium chloride SA (KLOR-CON) 20 MEQ tablet TAKE 2 TABLETS TWO TIMES DAILY appt needed refill [phone] 360 tablet 0   pravastatin (PRAVACHOL) 20 MG tablet TAKE 1 TABLET EVERY DAY 90 tablet 1   No current facility-administered medications for this encounter.    No Known Allergies  Social History   Socioeconomic History   Marital status: Married    Spouse name: Not on file   Number of children: 3    Years of education: Not on file   Highest education level: Not on file  Occupational History   Occupation: retired   Tobacco Use   Smoking status: Former    Packs/day: 1.00    Years: 7.00    Pack years: 7.00    Types: Cigarettes   Smokeless tobacco: Never   Tobacco comments:    smoked cigarettes age [age], up to 1ppd  Vaping Use   Vaping Use: Never used  Substance and Sexual Activity   Alcohol use: Yes    Alcohol/week: 2.0 standard drinks    Types: 1 Glasses of wine, 1 Cans of beer per week    Comment: less than 1 drink per week   Drug use: No   Sexual activity: Yes  Other Topics Concern   Not on file  Social History Narrative   Not on file   Social Determinants of Health   Financial Resource Strain: Low Risk    Difficulty of Paying Living Expenses: Not very hard  Food Insecurity: Not on file  Transportation Needs: No Transportation Needs   Lack of Transportation (Medical): No   Lack of Transportation (Non-Medical): No  Physical Activity: Sufficiently Active   Days of Exercise per Week: 5 days   Minutes of Exercise per Session: 30 min  Stress: No Stress Concern Present   Feeling of Stress : Not at all  Social Connections: Socially Integrated   Frequency of Communication with Friends and Family: More than three times a week   Frequency of Social Gatherings with Friends and Family: More than three times a week   Attends Religious Services: More than 4 times per year   Active Member of [employer] or Organizations: Yes   Attends Music therapist: More than 4 times per year   Marital Status: Married  Human resources officer Violence: Not on file    Family History  Problem Relation Age of Onset   Prostate cancer Father    Hypertension Father    Hypertension Mother    Prostate cancer Brother    Melanoma Sister    Atrial fibrillation Brother    Melanoma Brother    Lupus Sister    Diabetes Neg Hx    Heart disease Neg Hx    Stroke Neg Hx    Colon cancer Neg Hx      ROS- All systems are reviewed and negative except as per the HPI above  Physical Exam: Vitals:   10/03/20 1455  BP: (!) 146/72  Pulse: (!) 59  Weight: 80.1 kg  Height: _0  (1.702 m)    GEN- The patient is well appearing, alert and oriented x 3 today.   Head- normocephalic, atraumatic Eyes-  Sclera clear, conjunctiva  pink Ears- hearing intact Oropharynx- clear Neck- supple, no JVP Lymph- no cervical lymphadenopathy Lungs- Clear to ausculation bilaterally, normal work of breathing Heart- regular rate and rhythm  no murmurs, rubs or gallops, PMI not laterally displaced GI- soft, NT, ND, + BS Extremities- no clubbing, cyanosis, or edema MS- no significant deformity or atrophy Skin- no rash or lesion Psych- euthymic mood, full affect Neuro- strength and sensation are intact  EKG- sinus brady at 59  bpm, with PAC's, pr int 158 ms, qrs int 138 ms, qtc 453 ms Epic records reviewed.  Assessment and Plan: 1. Afib  Low afib burden Continue on tikosyn 500 mg bid  qtc stable Continue apixaban 5 mg bid, no bleeding issues Bmet/mag  2.OSA Still wearing mouthpiece  3. HTN  Stable Avoid salt  4. Asymptomatic brady Improved with reduction of metoprolol   F/u in 6 months    Butch Penny C. Talishia Betzler, [facility] [address] [place], [place] [address] [phone]

## 2020-11-06 ENCOUNTER — Telehealth: Payer: Self-pay | Admitting: Pharmacist

## 2020-11-06 NOTE — Progress Notes (Signed)
    Chronic Care Management Pharmacy Assistant   Name: Daniel Tucker  MRN: [id number] DOB: 11-30-1940   Reason for Encounter: Disease State   Conditions to be addressed/monitored: General   Recent office visits:  None ID  Recent consult visits:  10/03/20 Roderic Palau NP Cardiology, no medication changes  Hospital visits:  None in previous 6 months  Medications: Outpatient Encounter Medications as of 11/06/2020  Medication Sig   acetaminophen (TYLENOL) 500 MG tablet Take 500 mg by mouth every 6 (six) hours as needed for moderate pain or headache.    acyclovir (ZOVIRAX) 200 MG capsule TAKE 2 CAPSULES EVERY DAY   amLODipine (NORVASC) 5 MG tablet TAKE 1 AND 1/2 TABLETS EVERY DAY   Ascorbic Acid (VITAMIN C) 1000 MG tablet Take 1,000 mg by mouth daily.   Cholecalciferol (VITAMIN D) 50 MCG (2000 UT) tablet Take 2,000 Units by mouth daily.   docusate sodium (COLACE) 100 MG capsule Take 100 mg by mouth 2 (two) times daily.    dofetilide (TIKOSYN) 500 MCG capsule TAKE 1 CAPSULE TWICE DAILY   ELIQUIS 5 MG TABS tablet TAKE 1 TABLET TWICE DAILY   GLUCOSAMINE SULFATE PO Take 2 tablets by mouth daily.    metoprolol tartrate (LOPRESSOR) 25 MG tablet TAKE 1/2 TABLET TWICE DAILY   Multiple Vitamin (MULTIVITAMIN) tablet Take 1 tablet by mouth daily.   Multiple Vitamins-Minerals (PRESERVISION AREDS 2 PO) Take 1 capsule by mouth 2 (two) times daily.    potassium chloride SA (KLOR-CON) 20 MEQ tablet TAKE 2 TABLETS TWO TIMES DAILY appt needed refill [phone]   pravastatin (PRAVACHOL) 20 MG tablet TAKE 1 TABLET EVERY DAY   No facility-administered encounter medications on file as of 11/06/2020.    Pharmacy Review  Have you had any problems recently with your health? Patient states that he is doing well and does not have any new health issues at this time  Have you had any problems with your pharmacy? Patient states that he does not have any problems with getting his medications or the  cost of medications from the pharmacy  What issues or side effects are you having with your medications? Patient states that he does not have any side effects from medications  What would you like me to pass along to [facility] for them to help you with?  Patient states that he is doing well and does not have any concerns about health or medications at this time  What can we do to take care of you better?  Patient states not at this time  Star Rating Drugs: Pravastatin 09/03/20 90 ds  [place] Pharmacist Assistant [phone]   Time spent:20

## 2020-11-21 DIAGNOSIS — N403 Nodular prostate with lower urinary tract symptoms: Secondary | ICD-10-CM | POA: Diagnosis not present

## 2020-11-21 DIAGNOSIS — R351 Nocturia: Secondary | ICD-10-CM | POA: Diagnosis not present

## 2020-11-21 DIAGNOSIS — N5201 Erectile dysfunction due to arterial insufficiency: Secondary | ICD-10-CM | POA: Diagnosis not present

## 2020-12-04 ENCOUNTER — Other Ambulatory Visit (HOSPITAL_COMMUNITY): Payer: Self-pay | Admitting: Nurse Practitioner

## 2020-12-23 ENCOUNTER — Emergency Department (INDEPENDENT_AMBULATORY_CARE_PROVIDER_SITE_OTHER)
Admission: EM | Admit: 2020-12-23 | Discharge: 2020-12-23 | Disposition: A | Discharge status: Home / Self Care | Payer: Medicare Other | Source: Home / Self Care

## 2020-12-23 ENCOUNTER — Encounter: Payer: Self-pay | Admitting: Emergency Medicine

## 2020-12-23 ENCOUNTER — Other Ambulatory Visit: Payer: Self-pay

## 2020-12-23 DIAGNOSIS — H00012 Hordeolum externum right lower eyelid: Secondary | ICD-10-CM | POA: Diagnosis not present

## 2020-12-23 MED ORDER — CEFADROXIL 500 MG PO CAPS
500.0000 mg | ORAL_CAPSULE | Freq: Two times a day (BID) | ORAL | 1 refills | Status: DC
Start: 2020-12-23 — End: 2021-02-19

## 2020-12-23 MED ORDER — ERYTHROMYCIN 5 MG/GM OP OINT: TOPICAL_OINTMENT | OPHTHALMIC | 1 refills | Status: DC

## 2020-12-23 NOTE — ED Provider Notes (Signed)
[facility]    CSN: [id number] Arrival date & time: 12/23/20  1054      History   Chief Complaint Chief Complaint  Patient presents with   Eye Pain    HPI Daniel Tucker is a 80 y.o. male.   HPI  Daniel Tucker states he has had recurring "styes" over the last couple of years.  He Berry treated by his primary care doctor with Augmentin and erythromycin eye ointment.  He states this always does very well.  He has no known drug allergies.  His last infection was in April.  He has not had to see an eye specialist his vision is not affected. Medical chart is reviewed. Patient is on Eliquis for atrial fibrillation.  Past Medical History:  Diagnosis Date   Arthritis    "hips" (11/20/2014)   Benign essential HTN    Fasting hyperglycemia 2006   FBS 111; normal A1c   Hepatitis ~ 1955   "yellow juandice"   Hyperlipidemia    Internal hemorrhoids    Persistent atrial fibrillation ([place]) 08/24/2014   CHASD2VASC score is 3 - on Apixaban   Sleep apnea    intolerant to CPAP and now using oral device followed by Dr. 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/17/2018   Pre-op examination 05/11/2018   Hives 10/02/2017   Prediabetes 09/05/2015   Oral herpes simplex infection 09/05/2015   Arthritis of right hip 04/02/2015   Atrial fibrillation ([place]) 08/24/2014   Obstructive sleep apnea 07/28/2012   Nonspecific abnormal electrocardiogram (ECG) (EKG) 05/15/2011   Hyperlipidemia 10/10/2008   Essential hypertension 10/10/2008   History of colonic polyps 10/10/2008   NODULAR 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 OBST 02/02/2007    Past Surgical History:  Procedure Laterality Date   CARDIOVERSION N/A  10/12/2014   Procedure: CARDIOVERSION;  Surgeon: Darlin Coco, MD;  Location: [facility];  Service: Cardiovascular;  Laterality: N/A;   COLONOSCOPY  2011   no polyp   COLONOSCOPY W/ POLYPECTOMY  2006   [facility]   NASAL SINUS SURGERY  1997   PROSTATE BIOPSY  2010   Negative,Dr.Ottelin   TONSILLECTOMY AND ADENOIDECTOMY  1997   TOTAL HIP ARTHROPLASTY Left 08/2010   Dr Maureen Ralphs   TOTAL HIP ARTHROPLASTY Right 05/17/2018   Procedure: TOTAL HIP ARTHROPLASTY ANTERIOR APPROACH;  Surgeon: Gaynelle Arabian, MD;  Location[facility];  Service: Orthopedics;  Laterality: Right;   UVULOPALATOPHARYNGOPLASTY  1997   Dr.Woliki       Home Medications    Prior to Admission medications   Medication Sig Start Date End Date Taking? Authorizing Provider  acetaminophen (TYLENOL) 500 MG tablet Take 500 mg by mouth every 6 (six) hours as needed for moderate pain or headache.    Yes [provider]  acyclovir (ZOVIRAX) 200 MG capsule TAKE 2 CAPSULES EVERY DAY 09/03/20  Yes Burns, Claudina Lick, MD  amLODipine (NORVASC) 5 MG tablet TAKE 1 AND 1/2 TABLETS EVERY DAY 09/03/20  Yes Burns, Claudina Lick, MD  Ascorbic Acid (VITAMIN C) 1000 MG tablet Take 1,000 mg by mouth daily.   Yes [provider]  cefadroxil (DURICEF) 500 MG capsule Take 1 capsule (500 mg total) by mouth 2 (two) times  daily. 12/23/20  Yes Raylene Everts, MD  Cholecalciferol (VITAMIN D) 50 MCG (2000 UT) tablet Take 2,000 Units by mouth daily.   Yes [provider]  docusate sodium (COLACE) 100 MG capsule Take 100 mg by mouth 2 (two) times daily.    Yes [provider]  dofetilide (TIKOSYN) 500 MCG capsule TAKE 1 CAPSULE TWICE DAILY 12/05/20  Yes Sherran Needs, NP  ELIQUIS 5 MG TABS tablet TAKE 1 TABLET TWICE DAILY 08/09/20  Yes Sherran Needs, NP  erythromycin ophthalmic ointment Place a 1/4 inch ribbon of ointment into the lower eyelid twice daily 12/23/20  Yes Raylene Everts, MD  GLUCOSAMINE SULFATE PO Take 2 tablets by  mouth daily.    Yes [provider]  metoprolol tartrate (LOPRESSOR) 25 MG tablet TAKE 1/2 TABLET TWICE DAILY 08/31/20  Yes Sherran Needs, NP  Multiple Vitamin (MULTIVITAMIN) tablet Take 1 tablet by mouth daily.   Yes [provider]  Multiple Vitamins-Minerals (PRESERVISION AREDS 2 PO) Take 1 capsule by mouth 2 (two) times daily.    Yes [provider]  potassium chloride SA (KLOR-CON) 20 MEQ tablet TAKE 2 TABLETS TWO TIMES DAILY appt needed refill [phone] 09/19/20  Yes Sherran Needs, NP  pravastatin (PRAVACHOL) 20 MG tablet TAKE 1 TABLET EVERY DAY 09/03/20  Yes Burns, Claudina Lick, MD    Family History Family History  Problem Relation Age of Onset   Prostate cancer Father    Hypertension Father    Hypertension Mother    Prostate cancer Brother    Melanoma Sister    Atrial fibrillation Brother    Melanoma Brother    Lupus Sister    Diabetes Neg Hx    Heart disease Neg Hx    Stroke Neg Hx    Colon cancer Neg Hx     Social History Social History   Tobacco Use   Smoking status: Former    Packs/day: 1.00    Years: 7.00    Pack years: 7.00    Types: Cigarettes   Smokeless tobacco: Never   Tobacco comments:    smoked cigarettes age [age], up to 1ppd  Vaping Use   Vaping Use: Never used  Substance Use Topics   Alcohol use: Yes    Alcohol/week: 2.0 standard drinks    Types: 1 Glasses of wine, 1 Cans of beer per week    Comment: less than 1 drink per week   Drug use: No     Allergies   Patient has no known allergies.   Review of Systems Review of Systems See HPI  Physical Exam Triage Vital Signs ED Triage Vitals  Enc Vitals Group     BP 12/23/20 1119 (!) 154/89     Pulse Rate 12/23/20 1119 80     Resp 12/23/20 1119 16     Temp 12/23/20 1119 98.7 F (37.1 C)     Temp Source 12/23/20 1119 Oral     SpO2 12/23/20 1119 98 %     Weight --      Height --      Head Circumference --      Peak Flow --      Pain Score 12/23/20 1118 2      Pain Loc --      Pain Edu? --      Excl. in [place]? --    No data found.  Updated Vital Signs BP (!) 154/89 (BP Location: Right Arm)   Pulse 80  Temp 98.7 F (37.1 C) (Oral)   Resp 16   SpO2 98%      Physical Exam Constitutional:      General: He is not in acute distress.    Appearance: Normal appearance. He is well-developed and normal weight.  HENT:     Head: Normocephalic and atraumatic.     Mouth/Throat:     Comments: Mask is in place Eyes:     General: Vision grossly intact. Gaze aligned appropriately.     Conjunctiva/sclera: Conjunctivae normal.     Right eye: Right conjunctiva is not injected.     Pupils: Pupils are equal, round, and reactive to light.   Cardiovascular:     Rate and Rhythm: Normal rate.  Pulmonary:     Effort: Pulmonary effort is normal. No respiratory distress.  Abdominal:     General: There is no distension.     Palpations: Abdomen is soft.  Musculoskeletal:        General: Normal range of motion.     Cervical back: Normal range of motion.  Skin:    General: Skin is warm and dry.  Neurological:     Mental Status: He is alert.     UC Treatments / Results  Labs (all labs ordered are listed, but only abnormal results are displayed) Labs Reviewed - No data to display  EKG   Radiology No results found.  Procedures Procedures (including critical care time)  Medications Ordered in UC Medications - No data to display  Initial Impression / Assessment and Plan / UC Course  I have reviewed the triage vital signs and the nursing notes.  Pertinent labs & imaging results that were available during my care of the patient were reviewed by me and considered in my medical decision making (see chart for details).     I intended to give patient the same treatment as his primary care doctor since he is pleased with those results.  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  I gave him instead cefadroxil to take twice a day  with erythromycin ointment.  He is given a refill in case this recurs.  Discussed follow-up with primary care Final Clinical Impressions(s) / UC Diagnoses   Final diagnoses:  Hordeolum externum of right lower eyelid     Discharge Instructions      Take antibiotic 2 times a day for 1 week Use the eye ointment 2-3 times a day until eye symptoms improve Follow-up with Dr. Quay Burow   ED Prescriptions     Medication Sig Dispense Auth. Provider   erythromycin ophthalmic ointment Place a 1/4 inch ribbon of ointment into the lower eyelid twice daily 1 g Raylene Everts, MD   cefadroxil (DURICEF) 500 MG capsule Take 1 capsule (500 mg total) by mouth 2 (two) times daily. 14 capsule Raylene Everts, MD      PDMP not reviewed this encounter.   Raylene Everts, MD 12/23/20 1228

## 2020-12-23 NOTE — ED Triage Notes (Signed)
Patient presents to Urgent Care with complaints of cyst of lower right eye lid since 2 days ago. Patient reports some irritation and soreness of the right eye. This morning woke up with cyst of the lower right eye lid. No changes of vision in the right eye. Used warm compresses on eye yesterday.

## 2020-12-23 NOTE — Discharge Instructions (Addendum)
Take antibiotic 2 times a day for 1 week Use the eye ointment 2-3 times a day until eye symptoms improve Follow-up with Dr. Quay Burow

## 2021-01-22 DIAGNOSIS — Z23 Encounter for immunization: Secondary | ICD-10-CM | POA: Diagnosis not present

## 2021-01-25 ENCOUNTER — Encounter: Payer: Self-pay | Admitting: Nurse Practitioner

## 2021-02-11 ENCOUNTER — Other Ambulatory Visit (HOSPITAL_COMMUNITY): Payer: Self-pay | Admitting: Nurse Practitioner

## 2021-02-18 NOTE — Progress Notes (Signed)
02/18/2021 Daniel Tucker [id number] 1940-11-12   CHIEF COMPLAINT: Rectal bleeding  HISTORY OF PRESENT ILLNESS: Daniel Tucker is a 80 year old male with a past medical history of arthritis, hypertension, atrial fibrillation on Eliquis, hyperlipidemia, sleep apnea does not use cpap and colon polyps.  He is followed by Dr. Fuller Plan.  He presents to our office today for further evaluation regarding recurrent rectal bleeding.  He reports having prior history of rectal bleeding which he attributed to having hemorrhoids.  He underwent hemorrhoid banding by Dr. Fuller Plan x 20 September 2017.  Since then, his rectal bleeding significantly improved and he infrequently saw small amount of blood on the toilet tissue or in the commode once every month or two. However, early October 2022 he passed a moderate amount of bright red blood from the rectum when he passed a normal formed stool without straining. This was a lot more red blood than he ever saw before. The next 3 to 4 days had "seepage" of lighter pink blood from the rectum for which he needed to wear depends.  He had diarrhea around the fifth day which resolved within 24 hours and since then no further rectal bleeding.  No anorectal pain.  No upper or lower abdominal pain.  He questions if he can have further internal hemorrhoid banding.  His most recent colonoscopy was 08/02/2015 which identified a 6 mm tubular adenomatous polyp which removed from the rectum, mild sigmoid diverticulosis and internal hemorrhoids.  He was advised to repeat a colonoscopy 07/2020 which has not been done.  No known family history of colorectal cancer.  He has a history of atrial fibrillation and he remains on Eliquis.  No history of coronary artery disease.  No chest pain, palpitations, dizziness or shortness of breath.   CBC Latest Ref Rng & Units 01/11/2020 06/17/2019 06/17/2018  WBC 3.8 - 10.8 Thousand/uL 7.7 5.2 8.0  Hemoglobin 13.2 - 17.1 g/dL 14.5 14.8 13.4  Hematocrit 38.5 -  50.0 % 42.8 46.1 41.6  Platelets 140 - 400 Thousand/uL 221 216 315    CMP Latest Ref Rng & Units 10/03/2020 01/11/2020 12/15/2019  Glucose 70 - 99 mg/dL 110(H) 94 148(H)  BUN 8 - 23 mg/dL 19 17 19   Creatinine 0.61 - 1.24 mg/dL 1.04 1.04 1.07  Sodium 135 - 145 mmol/L 136 138 138  Potassium 3.5 - 5.1 mmol/L 3.9 4.2 4.3  Chloride 98 - 111 mmol/L 101 100 102  CO2 22 - 32 mmol/L 28 27 28   Calcium 8.9 - 10.3 mg/dL 8.9 9.4 9.3  Total Protein 6.1 - 8.1 g/dL - 7.2 -  Total Bilirubin 0.2 - 1.2 mg/dL - 0.6 -  Alkaline Phos 39 - 117 U/L - - -  AST 10 - 35 U/L - 20 -  ALT 9 - 46 U/L - 16 -     Colonoscopy 08/02/2015: - One 6 mm polyp in the rectum, removed with a cold snare. Resected and retrieved. - Internal hemorrhoids. - Mild sigmoid diverticulosis - 5 year recall - TUBULAR ADENOMA. NO HIGH GRADE DYSPLASIA OR MALIGNANCY IDENTIFIED  Echo 09/11/2014: - Left ventricle: The cavity size was mildly dilated. Wall    thickness was normal. Systolic function was normal. The estimated    ejection fraction was in the range of 55% to 60%. Wall motion was    normal; there were no regional wall motion abnormalities.  - Mitral valve: There was mild regurgitation.  - Left atrium: The atrium was moderately to severely dilated.  -  Right atrium: The atrium was moderately dilated.  - Pulmonary arteries: Systolic pressure was mildly increased. PA    peak pressure: 42 mm Hg (S).   Past Medical History:  Diagnosis Date   Arthritis    "hips" (11/20/2014)   Benign essential HTN    Fasting hyperglycemia 2006   FBS 111; normal A1c   Hepatitis ~ 1955   "yellow juandice"   Hyperlipidemia    Internal hemorrhoids    Persistent atrial fibrillation ([place]) 08/24/2014   CHASD2VASC score is 3 - on Apixaban   Sleep apnea    intolerant to CPAP and now using oral device followed by Dr. Toy Cookey   Tubular adenoma of colon 2006   Dr Fuller Plan   Past Surgical History:  Procedure Laterality Date   CARDIOVERSION N/A 10/12/2014    Procedure: CARDIOVERSION;  Surgeon: Darlin Coco, MD;  Location: [facility];  Service: Cardiovascular;  Laterality: N/A;   COLONOSCOPY  2011   no polyp   COLONOSCOPY W/ POLYPECTOMY  2006   [facility]   NASAL SINUS SURGERY  1997   PROSTATE BIOPSY  2010   Negative,Dr.Ottelin   TONSILLECTOMY AND ADENOIDECTOMY  1997   TOTAL HIP ARTHROPLASTY Left 08/2010   Dr Maureen Ralphs   TOTAL HIP ARTHROPLASTY Right 05/17/2018   Procedure: TOTAL HIP ARTHROPLASTY ANTERIOR APPROACH;  Surgeon: Gaynelle Arabian, MD;  [facility];  Service: Orthopedics;  Laterality: Right;   UVULOPALATOPHARYNGOPLASTY  1997   Dr.Woliki   Social History: He is married.  He has 1 son and 2 daughters.  He is retired.  He quiet smoking cigarettes in the 1960's. Infrequent alcohol intake.  No drug use.  Family History: Father with history of prostate cancer. Mother heart disease and CHF. Brother had prostate cancer with liver mets. Brother with atrial fibrillation. Sister with Lupus. Sister with melanoma.   No Known Allergies   Outpatient Encounter Medications as of 02/19/2021  Medication Sig   acetaminophen (TYLENOL) 500 MG tablet Take 500 mg by mouth every 6 (six) hours as needed for moderate pain or headache.    acyclovir (ZOVIRAX) 200 MG capsule TAKE 2 CAPSULES EVERY DAY   amLODipine (NORVASC) 5 MG tablet TAKE 1 AND 1/2 TABLETS EVERY DAY   Ascorbic Acid (VITAMIN C) 1000 MG tablet Take 1,000 mg by mouth daily.   cefadroxil (DURICEF) 500 MG capsule Take 1 capsule (500 mg total) by mouth 2 (two) times daily.   Cholecalciferol (VITAMIN D) 50 MCG (2000 UT) tablet Take 2,000 Units by mouth daily.   docusate sodium (COLACE) 100 MG capsule Take 100 mg by mouth 2 (two) times daily.    dofetilide (TIKOSYN) 500 MCG capsule TAKE 1 CAPSULE TWICE DAILY   ELIQUIS 5 MG TABS tablet TAKE 1 TABLET TWICE DAILY   erythromycin ophthalmic ointment Place a 1/4 inch ribbon of ointment into the lower eyelid twice daily   GLUCOSAMINE SULFATE PO Take  2 tablets by mouth daily.    metoprolol tartrate (LOPRESSOR) 25 MG tablet TAKE 1/2 TABLET TWICE DAILY   Multiple Vitamin (MULTIVITAMIN) tablet Take 1 tablet by mouth daily.   Multiple Vitamins-Minerals (PRESERVISION AREDS 2 PO) Take 1 capsule by mouth 2 (two) times daily.    potassium chloride SA (KLOR-CON) 20 MEQ tablet TAKE 2 TABLETS TWICE A DAY   pravastatin (PRAVACHOL) 20 MG tablet TAKE 1 TABLET EVERY DAY   No facility-administered encounter medications on file as of 02/19/2021.    REVIEW OF SYSTEMS:  Gen: Denies fever, sweats or chills. No weight loss.  CV: Denies chest pain, palpitations or edema. Resp: Denies cough, shortness of breath of hemoptysis.  GI: See HPI.   GU : Denies urinary burning, blood in urine, increased urinary frequency or incontinence. MS: + Arthritis and muscle cramps.  Derm: Denies rash, itchiness, skin lesions or unhealing ulcers. Psych: Denies depression, anxiety or memory loss. Heme: + Easy bleeding on Eliquis.  Neuro:  Denies headaches, dizziness or paresthesias. Endo:  Denies any problems with DM, thyroid or adrenal function.  PHYSICAL EXAM: BP (!) 142/70 (BP Location: Left Arm, Patient Position: Sitting, Cuff Size: Normal)   Pulse 60   Ht 5' 6.5" (1.689 m) Comment: height measured without shoes  Wt 181 lb 6 oz (82.3 kg)   BMI 28.84 kg/m   General: Well developed 80 year old male in no acute distress. Head: Normocephalic and atraumatic. Eyes:  Sclerae non-icteric, conjunctive pink. Ears: Normal auditory acuity. Mouth: Dentition intact. No ulcers or lesions.  Neck: Supple, no lymphadenopathy or thyromegaly.  Lungs: Clear bilaterally to auscultation without wheezes, crackles or rhonchi. Heart: Regular rate and rhythm. No murmur, rub or gallop appreciated.  Abdomen: Soft, nontender, non distended. No masses. No hepatosplenomegaly. Normoactive bowel sounds x 4 quadrants.  Rectal: No external hemorrhoids.  A very small anterior anal tag.  Internal  hemorrhoids without prolapse, right lateral internal hemorrhoid > left. Prostate enlarged with irregular texture when examined in the left lateral position.  No blood.  Brown stool in the rectal vault.  Melissa CMA present during exam. Musculoskeletal: Symmetrical with no gross deformities. Skin: Warm and dry. No rash or lesions on visible extremities. Extremities: No edema. Neurological: Alert oriented x 4, no focal deficits.  Psychological:  Alert and cooperative. Normal mood and affect.  ASSESSMENT AND PLAN:  22) 81 year old male on Eliquis for afib with rectal bleeding, likely hemorrhoidal. S/P internal hemorrhoid banding x 20 September 2017.  -CBC -Apply a small amount of Desitin inside the anal opening and to the external anal area tid as needed for anal or hemorrhoidal irritation/bleeding.  -Anusol HC 25gm suppository 1 PR nightly as needed x 5 nights, preparation H suppositories if Anusol not covered by insurance -Colonoscopy benefits and risks discussed including risk with sedation, risk of bleeding, perforation and infection benefits and risks discussed including risk with sedation, risk of bleeding, perforation and infection  -No recommendations for hemorrhoidal banding at this time, defer decision to Dr. Fuller Plan after colonoscopy completed -Patient to call office if rectal bleeding recurs, if he demonstrates significant hematochezia to consider abd/pelvic CTA to rule out diverticular bleed  2) History of a 7 mm tubular adenomatous rectal polyp per colonoscopy 07/2015 -Colonoscopy as ordered above  -Our office will contact the patient's cardiologist Dr. Tressia Miners [facility] to verify Eliquis instructions prior to proceeding with a colonoscopy  3) Atrial fibrillation on Eliquis  4) BPH, prostate enlarged with irregular texture on exam today -Patient advised to follow-up with urology        CC:  Binnie Rail, MD

## 2021-02-19 ENCOUNTER — Other Ambulatory Visit (INDEPENDENT_AMBULATORY_CARE_PROVIDER_SITE_OTHER): Payer: Medicare Other

## 2021-02-19 ENCOUNTER — Encounter: Payer: Self-pay | Admitting: Nurse Practitioner

## 2021-02-19 ENCOUNTER — Ambulatory Visit (INDEPENDENT_AMBULATORY_CARE_PROVIDER_SITE_OTHER): Payer: Medicare Other | Admitting: Nurse Practitioner

## 2021-02-19 VITALS — BP 142/70 | HR 60 | Ht 66.5 in | Wt 181.4 lb

## 2021-02-19 DIAGNOSIS — I4891 Unspecified atrial fibrillation: Secondary | ICD-10-CM | POA: Diagnosis not present

## 2021-02-19 DIAGNOSIS — K648 Other hemorrhoids: Secondary | ICD-10-CM

## 2021-02-19 DIAGNOSIS — K625 Hemorrhage of anus and rectum: Secondary | ICD-10-CM

## 2021-02-19 LAB — CBC
HCT: 41.6 % (ref 39.0–52.0)
Hemoglobin: 13.8 g/dL (ref 13.0–17.0)
MCHC: 33.3 g/dL (ref 30.0–36.0)
MCV: 96.9 fl (ref 78.0–100.0)
Platelets: 181 10*3/uL (ref 150.0–400.0)
RBC: 4.29 Mil/uL (ref 4.22–5.81)
RDW: 12.8 % (ref 11.5–15.5)
WBC: 5.6 10*3/uL (ref 4.0–10.5)

## 2021-02-19 MED ORDER — PLENVU 140 G PO SOLR: ORAL | 0 refills | Status: DC

## 2021-02-19 NOTE — Patient Instructions (Addendum)
PROCEDURES: You have been scheduled for a colonoscopy. Please follow the written instructions given to you at your visit today. Please pick up your prep supplies at the pharmacy within the next 1-3 days. If you use inhalers (even only as needed), please bring them with you on the day of your procedure.  LABS:  Lab work has been ordered for you today. Our lab is located in the basement. Press "B" on the elevator. The lab is located at the first door on the left as you exit the elevator.  HEALTHCARE LAWS AND MY CHART RESULTS: Due to recent changes in healthcare laws, you may see the results of your imaging and laboratory studies on MyChart before your provider has had a chance to review them.   We understand that in some cases there may be results that are confusing or concerning to you. Not all laboratory results come back in the same time frame and the provider may be waiting for multiple results in order to interpret others.  Please give [place] 48 hours in order for your provider to thoroughly review all the results before contacting the office for clarification of your results.   RECOMMENDATIONS: Desitin: Apply a small amount to the external and internal anal area three times a day as needed. Over the counter Preparation H suppository, use 1 a night for 5 nights. Please call our office if rectal bleeding recurs.  It was great seeing you today! Thank you for entrusting me with your care and choosing [facility].  Noralyn Pick, CRNP  The [facility] providers would like to encourage you to use [facility] to communicate with providers for non-urgent requests or questions.  Due to long hold times on the telephone, sending your provider a message by [facility] may be faster and more efficient way to get a response. Please allow 48 business hours for a response.  Please remember that this is for non-urgent requests/questions.  If you are age 79 or older, your body mass index should be  between 23-30. Your Body mass index is 28.84 kg/m. If this is out of the aforementioned range listed, please consider follow up with your Primary Care Provider.  If you are age 79 or younger, your body mass index should be between 19-25. Your Body mass index is 28.84 kg/m. If this is out of the aformentioned range listed, please consider follow up with your Primary Care Provider.

## 2021-02-21 NOTE — Progress Notes (Signed)
Reviewed and agree with management plan.  Abdulhamid Olgin T. Saralynn Langhorst, MD FACG 

## 2021-03-01 ENCOUNTER — Telehealth: Payer: Self-pay | Admitting: Nurse Practitioner

## 2021-03-01 NOTE — Telephone Encounter (Signed)
Patient called stating he was having issues with hemorrhoids and rectal bleeding (a small amount since yesterday).  He was told to call back if he was having issues.  Please call and advise.

## 2021-03-01 NOTE — Telephone Encounter (Signed)
Patient used the Prep H for 5 days with the Desitin. No bleeding during that time. He is now using only Desitin. He noted a little bit of blood after the bowel movement today. No blood since. No abdominal pain.  Please advise.

## 2021-03-03 NOTE — Telephone Encounter (Signed)
Beth, pls contact patient for further update. He can continue to use Desitin for minor rectal bleeding. If he develops severe rectal bleeding he should go to the ED for further evaluation (abd/pelvic CT angiogram). He should proceed with his colonoscopy next month with Dr. Fuller Plan as scheduled. Thx

## 2021-03-04 NOTE — Telephone Encounter (Signed)
No further bleeding. Understands to continue the use of the Desitin PRN minor bleeding. To the ER if he develops severe bleeding.

## 2021-03-06 DIAGNOSIS — R351 Nocturia: Secondary | ICD-10-CM | POA: Diagnosis not present

## 2021-03-06 DIAGNOSIS — N403 Nodular prostate with lower urinary tract symptoms: Secondary | ICD-10-CM | POA: Diagnosis not present

## 2021-03-06 DIAGNOSIS — N5201 Erectile dysfunction due to arterial insufficiency: Secondary | ICD-10-CM | POA: Diagnosis not present

## 2021-03-08 ENCOUNTER — Telehealth: Payer: Self-pay

## 2021-03-08 NOTE — Telephone Encounter (Signed)
   Primary Cardiologist: None  Chart reviewed as part of pre-operative protocol coverage.  Clinical pharmacist have given the following recommendations for,Daniel Tucker .   Patient with diagnosis of afib on Eliquis for anticoagulation.     Procedure: colonoscopy Date of procedure: 03/23/21   CHA2DS2-VASc Score = 3  This indicates a 3.2% annual risk of stroke. The patient's score is based upon: CHF History: 0 HTN History: 1 Diabetes History: 0 Stroke History: 0 Vascular Disease History: 0 Age Score: 2 Gender Score: 0   CrCl 86mL/min Platelet count 181K   Per office protocol, patient can hold Eliquis for 2 days prior to procedure as requested.  I will route this recommendation to the requesting party via Epic fax function and remove from pre-op pool.  Please call with questions.  Jossie Ng. Tali Coster NP-C    03/08/2021, 12:44 PM [place] [address] Office [phone] Fax [phone]

## 2021-03-08 NOTE — Telephone Encounter (Signed)
Patient with diagnosis of afib on Eliquis for anticoagulation.    Procedure: colonoscopy Date of procedure: 03/23/21  CHA2DS2-VASc Score = 3  This indicates a 3.2% annual risk of stroke. The patient's score is based upon: CHF History: 0 HTN History: 1 Diabetes History: 0 Stroke History: 0 Vascular Disease History: 0 Age Score: 2 Gender Score: 0  CrCl 55mL/min Platelet count 181K  Per office protocol, patient can hold Eliquis for 2 days prior to procedure as requested.

## 2021-03-08 NOTE — Telephone Encounter (Signed)
Request for surgical clearance:     Endoscopy Procedure  What type of surgery is being performed?     Colonoscopy  When is this surgery scheduled?     03/23/21  What type of clearance is required ?   Pharmacy  Are there any medications that need to be held prior to surgery and how long? Eliquis 2 day hold  Practice name and name of physician performing surgery?      [facility]  What is your office phone and fax number?      Phone- [phone]  [phone]  Anesthesia type (None, local, MAC, general) ?       MAC

## 2021-03-19 ENCOUNTER — Telehealth: Payer: Medicare Other

## 2021-03-25 ENCOUNTER — Other Ambulatory Visit: Payer: Self-pay

## 2021-03-25 ENCOUNTER — Ambulatory Visit (INDEPENDENT_AMBULATORY_CARE_PROVIDER_SITE_OTHER): Payer: Medicare Other

## 2021-03-25 DIAGNOSIS — Z0000 Encounter for general adult medical examination without abnormal findings: Secondary | ICD-10-CM | POA: Diagnosis not present

## 2021-03-25 NOTE — Progress Notes (Signed)
I connected with Daniel Tucker today by telephone and verified that I am speaking with the correct person using two identifiers. Location patient: home Location provider: work Persons participating in the virtual visit: patient, provider.   I discussed the limitations, risks, security and privacy concerns of performing an evaluation and management service by telephone and the availability of in person appointments. I also discussed with the patient that there may be a patient responsible charge related to this service. The patient expressed understanding and verbally consented to this telephonic visit.    Interactive audio and video telecommunications were attempted between this provider and patient, however failed, due to patient having technical difficulties OR patient did not have access to video capability.  We continued and completed visit with audio only.  Some vital signs may be absent or patient reported.   Time Spent with patient on telephone encounter: 40 minutes  Subjective:   Daniel Tucker is a 80 y.o. male who presents for Medicare Annual/Subsequent preventive examination.  Review of Systems     Cardiac Risk Factors include: advanced age (>48men, >84 women);family history of premature cardiovascular disease;hypertension;male gender;dyslipidemia     Objective:    There were no vitals filed for this visit. There is no height or weight on file to calculate BMI.  Advanced Directives 03/25/2021 01/11/2020 05/17/2018 05/12/2018 09/11/2017 02/05/2017 10/02/2016  Does Patient Have a Medical Advance Directive? No No No No No No No  Does patient want to make changes to medical advance directive? - No - Patient declined - - - - -  Would patient like information on creating a medical advance directive? No - Patient declined No - Patient declined No - Patient declined No - Patient declined No - Patient declined No - Patient declined No - Patient declined    Current Medications  (verified) Outpatient Encounter Medications as of 03/25/2021  Medication Sig   sildenafil (VIAGRA) 100 MG tablet Take 100 mg by mouth daily as needed for erectile dysfunction.   acetaminophen (TYLENOL) 500 MG tablet Take 500 mg by mouth every 6 (six) hours as needed for moderate pain or headache.    acyclovir (ZOVIRAX) 200 MG capsule TAKE 2 CAPSULES EVERY DAY   amLODipine (NORVASC) 5 MG tablet TAKE 1 AND 1/2 TABLETS EVERY DAY   Ascorbic Acid (VITAMIN C) 1000 MG tablet Take 1,000 mg by mouth daily.   Cholecalciferol (VITAMIN D) 50 MCG (2000 UT) tablet Take 2,000 Units by mouth daily.   docusate sodium (COLACE) 100 MG capsule Take 100 mg by mouth 2 (two) times daily.    dofetilide (TIKOSYN) 500 MCG capsule TAKE 1 CAPSULE TWICE DAILY   ELIQUIS 5 MG TABS tablet TAKE 1 TABLET TWICE DAILY   GLUCOSAMINE SULFATE PO Take 2 tablets by mouth daily.    metoprolol tartrate (LOPRESSOR) 25 MG tablet TAKE 1/2 TABLET TWICE DAILY   Multiple Vitamin (MULTIVITAMIN) tablet Take 1 tablet by mouth daily.   Multiple Vitamins-Minerals (PRESERVISION AREDS 2 PO) Take 1 capsule by mouth 2 (two) times daily.    PEG-KCl-NaCl-NaSulf-Na Asc-C (PLENVU) 140 g SOLR Use as directed for colonoscopy prep.   potassium chloride SA (KLOR-CON) 20 MEQ tablet TAKE 2 TABLETS TWICE A DAY   pravastatin (PRAVACHOL) 20 MG tablet TAKE 1 TABLET EVERY DAY   No facility-administered encounter medications on file as of 03/25/2021.    Allergies (verified) Patient has no known allergies.   History: Past Medical History:  Diagnosis Date   Arthritis    "hips" (11/20/2014)  Benign essential HTN    Fasting hyperglycemia 2006   FBS 111; normal A1c   Hepatitis ~ 1955   "yellow juandice"   Hyperlipidemia    Internal hemorrhoids    Persistent atrial fibrillation ([place]) 08/24/2014   CHASD2VASC score is 3 - on Apixaban   Sleep apnea    intolerant to CPAP and now using oral device followed by Dr. 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   Dr Maureen Ralphs   TOTAL HIP ARTHROPLASTY Right 05/17/2018   Procedure: TOTAL HIP ARTHROPLASTY ANTERIOR APPROACH;  Surgeon: Gaynelle Arabian, MD;  Location: [facility];  Service: Orthopedics;  Laterality: Right;   UVULOPALATOPHARYNGOPLASTY  1997   Dr.Woliki   Family History  Problem Relation Age of Onset   Hypertension Mother    Congestive Heart Failure Mother    Prostate cancer Father    Hypertension Father    Melanoma Sister    Lupus Sister    Prostate cancer Brother        mets to liver   Liver cancer Brother    Atrial fibrillation Brother    Melanoma Brother    Diabetes Neg Hx    Heart disease Neg Hx    Stroke Neg Hx    Colon cancer Neg Hx    Social History   Socioeconomic History   Marital status: Married    Spouse name: Not on file   Number of children: 3   Years of education: Not on file   Highest education level: Not on file  Occupational History   Occupation: retired   Tobacco Use   Smoking status: Former    Packs/day: 1.00    Years: 7.00    Pack years: 7.00    Types: Cigarettes    Quit date: 1968    Years since quitting: 54.9   Smokeless tobacco: Never   Tobacco comments:    smoked cigarettes age [age], up to 1ppd  Vaping Use   Vaping Use: Never used  Substance and Sexual Activity   Alcohol use: Yes    Alcohol/week: 2.0 standard drinks    Types: 1 Glasses of wine, 1 Cans of beer per week    Comment: less than 1 drink per week   Drug use: No   Sexual activity: Yes  Other Topics Concern   Not on file  Social  History Narrative   Not on file   Social Determinants of Health   Financial Resource Strain: Low Risk    Difficulty of Paying Living Expenses: Not hard at all  Food Insecurity: No Food Insecurity   Worried About Charity fundraiser in the Last Year: Never true   Ran Out of Food in the Last Year: Never true  Transportation Needs: No Transportation Needs   Lack of Transportation (Medical): No   Lack of Transportation (Non-Medical): No  Physical Activity: Sufficiently Active   Days of Exercise per Week: 5 days   Minutes of Exercise per Session: 30 min  Stress: No Stress  Concern Present   Feeling of Stress : Not at all  Social Connections: Socially Integrated   Frequency of Communication with Friends and Family: More than three times a week   Frequency of Social Gatherings with Friends and Family: More than three times a week   Attends Religious Services: More than 4 times per year   Active Member of [employer] or Organizations: Yes   Attends Music therapist: More than 4 times per year   Marital Status: Married    Tobacco Counseling Counseling given: Not Answered Tobacco comments: smoked cigarettes age [age], up to 1ppd   Clinical Intake:  Pre-visit preparation completed: Yes  Pain : No/denies pain     Nutritional Risks: None Diabetes: No  How often do you need to have someone help you when you read instructions, pamphlets, or other written materials from your doctor or pharmacy?: 1 - Never What is the last grade level you completed in school?: Bachelor's Degree  Diabetic? no  Interpreter Needed?: No  Information entered by :: Lisette Abu, LPN   Activities of Daily Living In your present state of health, do you have any difficulty performing the following activities: 03/25/2021  Hearing? N  Vision? N  Difficulty concentrating or making decisions? N  Walking or climbing stairs? N  Dressing or bathing? N  Doing errands, shopping? N  Preparing Food  and eating ? N  Using the Toilet? N  In the past six months, have you accidently leaked urine? N  Do you have problems with loss of bowel control? N  Managing your Medications? N  Managing your Finances? N  Housekeeping or managing your Housekeeping? N  Some recent data might be hidden    Patient Care Team: Binnie Rail, MD as PCP - General (Internal Medicine) Gaynelle Arabian, MD as Consulting Physician (Orthopedic Surgery) Freada Bergeron, [place] as Technician Melida Quitter, MD as Consulting Physician (Otolaryngology) Ladene Artist, MD as Consulting Physician (Gastroenterology) Thompson Grayer, MD as Consulting Physician (Cardiology) Charlton Haws, [facility] as Pharmacist (Pharmacist) Willow Ora as Consulting Physician (Optometry)  Indicate any recent Medical Services you may have received from other than [facility] providers in the past year (date may be approximate).     Assessment:   This is a routine wellness examination for [place].  Hearing/Vision screen Hearing Screening - Comments:: Patient denied any hearing difficulty.   No hearing aids.  Vision Screening - Comments:: Patient wears corrective glasses/contacts.  Eye exam done annually by:   Dietary issues and exercise activities discussed: Current Exercise Habits: Home exercise routine, Type of exercise: walking, Time (Minutes): 30, Frequency (Times/Week): 5, Weekly Exercise (Minutes/Week): 150, Intensity: Moderate, Exercise limited by: cardiac condition(s);orthopedic condition(s)   Goals Addressed               This Visit's Progress     Patient Stated (pt-stated)        To maintain my current health status by continuing to eat healthy, stay physically active and socially active.      Depression Screen PHQ 2/9 Scores 03/25/2021 01/11/2020 05/18/2019 05/11/2018 09/11/2017 09/05/2016 08/24/2014  PHQ - 2 Score 0 0 0 0 0 0 0    Fall Risk Fall Risk  03/25/2021 01/11/2020 12/03/2018 11/18/2018 05/11/2018  Falls in the past  year? 0 0 0 (No Data) 0  Comment - - - Emmi Telephone Survey: data to providers prior to load -  Number falls in past yr: 0 0 0 (No Data) -  Comment - - -  Emmi Telephone Survey Actual Response =  -  Injury with Fall? 0 0 0 - -  Risk for fall due to : No Fall Risks No Fall Risks - - -  Follow up - Falls evaluation completed Falls evaluation completed - -    FALL RISK PREVENTION PERTAINING TO THE HOME:  Any stairs in or around the home? Yes  If so, are there any without handrails? No  Home free of loose throw rugs in walkways, pet beds, electrical cords, etc? Yes  Adequate lighting in your home to reduce risk of falls? Yes   ASSISTIVE DEVICES UTILIZED TO PREVENT FALLS:  Life alert? No  Use of a cane, walker or w/c? No  Grab bars in the bathroom? Yes  Shower chair or bench in shower? Yes  Elevated toilet seat or a handicapped toilet? Yes   TIMED UP AND GO:  Was the test performed? No .  Length of time to ambulate 10 feet: n/a sec.   Gait steady and fast without use of assistive device  Cognitive Function: Normal cognitive status assessed by direct observation by this Nurse Health Advisor. No abnormalities found.       6CIT Screen 01/11/2020  What Year? 0 points  What month? 0 points  What time? 0 points  Count back from 20 0 points  Months in reverse 0 points  Repeat phrase 0 points  Total Score 0    Immunizations Immunization History  Administered Date(s) Administered   Fluad Quad(high Dose 65+) 01/22/2021   Influenza Split 03/18/2011   Influenza Whole 01/14/2010   Influenza, High Dose Seasonal PF 02/15/2013, 01/26/2014, 02/16/2015, 01/16/2016, 01/01/2017, 01/03/2019, 12/30/2019   Influenza,inj,quad, With Preservative 01/13/2018   PFIZER(Purple Top)SARS-COV-2 Vaccination 05/27/2019, 06/22/2019, 01/25/2020   Pfizer Covid-19 Vaccine Bivalent Booster 54yrs & up 01/22/2021   Pneumococcal Conjugate-13 08/24/2014   Pneumococcal Polysaccharide-23 02/04/2010   Td  11/08/2009   Tdap 02/25/2019   Zoster Recombinat (Shingrix) 01/13/2018, 03/25/2018    TDAP status: Up to date  Flu Vaccine status: Up to date  Pneumococcal vaccine status: Up to date  Covid-19 vaccine status: Completed vaccines  Qualifies for Shingles Vaccine? Yes   Zostavax completed No   Shingrix Completed?: Yes  Screening Tests Health Maintenance  Topic Date Due   COLONOSCOPY (Pts 45-44yrs Insurance coverage will need to be confirmed)  08/01/2020   TETANUS/TDAP  02/24/2029   Pneumonia Vaccine 64+ Years old  Completed   INFLUENZA VACCINE  Completed   COVID-19 Vaccine  Completed   Hepatitis C Screening  Completed   Zoster Vaccines- Shingrix  Completed   HPV VACCINES  Aged Out    Health Maintenance  Health Maintenance Due  Topic Date Due   COLONOSCOPY (Pts 45-22yrs Insurance coverage will need to be confirmed)  08/01/2020    Colorectal cancer screening: Type of screening: Colonoscopy. Completed 08/02/2015. Repeat every 5 years  Lung Cancer Screening: (Low Dose CT Chest recommended if Age 14-80 years, 30 pack-year currently smoking OR have quit w/in 15years.) does not qualify.   Lung Cancer Screening Referral: no  Additional Screening:  Hepatitis C Screening: does qualify; Completed: yes  Vision Screening: Recommended annual ophthalmology exams for early detection of glaucoma and other disorders of the eye. Is the patient up to date with their annual eye exam?  Yes  Who is the provider or what is the name of the office in which the patient attends annual eye exams? Neil Hutto, OD. If pt is not established with a provider, would they  like to be referred to a provider to establish care? No .   Dental Screening: Recommended annual dental exams for proper oral hygiene  Community Resource Referral / Chronic Care Management: CRR required this visit?  No   CCM required this visit?  No      Plan:     I have personally reviewed and noted the following in the  patient's chart:   Medical and social history Use of alcohol, tobacco or illicit drugs  Current medications and supplements including opioid prescriptions. Patient is not currently taking opioid prescriptions. Functional ability and status Nutritional status Physical activity Advanced directives List of other physicians Hospitalizations, surgeries, and ER visits in previous 12 months Vitals Screenings to include cognitive, depression, and falls Referrals and appointments  In addition, I have reviewed and discussed with patient certain preventive protocols, quality metrics, and best practice recommendations. A written personalized care plan for preventive services as well as general preventive health recommendations were provided to patient.     Sheral Flow, LPN   [id number]   Nurse Notes:  Patient is cogitatively intact. There were no vitals filed for this visit. There is no height or weight on file to calculate BMI. Patient stated that he has no issues with gait or balance; does not use any assistive devices. Medications reviewed with patient; no opioid use noted. Hearing Screening - Comments:: Patient denied any hearing difficulty.   No hearing aids.  Vision Screening - Comments:: Patient wears corrective glasses/contacts.  Eye exam done annually by:

## 2021-04-01 ENCOUNTER — Telehealth: Payer: Medicare Other

## 2021-04-02 ENCOUNTER — Encounter: Payer: Self-pay | Admitting: Gastroenterology

## 2021-04-02 ENCOUNTER — Other Ambulatory Visit: Payer: Self-pay

## 2021-04-02 ENCOUNTER — Ambulatory Visit (AMBULATORY_SURGERY_CENTER): Payer: Medicare Other | Admitting: Gastroenterology

## 2021-04-02 VITALS — BP 131/68 | HR 65 | Temp 96.8°F | Resp 15 | Ht 66.0 in | Wt 181.0 lb

## 2021-04-02 DIAGNOSIS — K640 First degree hemorrhoids: Secondary | ICD-10-CM | POA: Diagnosis not present

## 2021-04-02 DIAGNOSIS — K921 Melena: Secondary | ICD-10-CM | POA: Diagnosis not present

## 2021-04-02 DIAGNOSIS — K5730 Diverticulosis of large intestine without perforation or abscess without bleeding: Secondary | ICD-10-CM

## 2021-04-02 DIAGNOSIS — K625 Hemorrhage of anus and rectum: Secondary | ICD-10-CM | POA: Diagnosis not present

## 2021-04-02 DIAGNOSIS — Z86010 Personal history of colonic polyps: Secondary | ICD-10-CM

## 2021-04-02 DIAGNOSIS — I4891 Unspecified atrial fibrillation: Secondary | ICD-10-CM | POA: Diagnosis not present

## 2021-04-02 MED ORDER — SODIUM CHLORIDE 0.9 % IV SOLN: 500.0000 mL | Freq: Once | INTRAVENOUS | Status: DC

## 2021-04-02 NOTE — Progress Notes (Signed)
History & Physical  Primary Care Physician:  Binnie Rail, MD Primary Gastroenterologist: Lucio Edward, MD  CHIEF COMPLAINT: Hematochezia, personal history of colon polyps   HPI: Daniel Tucker is a 80 y.o. male with small-volume hematochezia and a personal history of adenomatous colon polyps for colonoscopy.  Eliquis was held 2 days prior to the procedure.   Past Medical History:  Diagnosis Date   Arthritis    "hips" (11/20/2014)   Benign essential HTN    Fasting hyperglycemia 2006   FBS 111; normal A1c   Hepatitis ~ 1955   "yellow juandice"   Hyperlipidemia    Internal hemorrhoids    Persistent atrial fibrillation ([place]) 08/24/2014   CHASD2VASC score is 3 - on Apixaban   Sleep apnea    intolerant to CPAP and now using oral device followed by Dr. 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 Authorizing Provider  acetaminophen (TYLENOL) 500 MG tablet Take 500 mg by mouth every 6 (six) hours as needed for moderate pain or headache.    Yes [provider]  acyclovir (ZOVIRAX) 200 MG capsule TAKE 2 CAPSULES EVERY DAY 09/03/20  Yes Burns, Claudina Lick, MD  amLODipine (NORVASC) 5 MG tablet TAKE 1 AND 1/2  TABLETS EVERY DAY 09/03/20  Yes Burns, Claudina Lick, MD  Ascorbic Acid (VITAMIN C) 1000 MG tablet Take 1,000 mg by mouth daily.   Yes [provider]  Cholecalciferol (VITAMIN D) 50 MCG (2000 UT) tablet Take 2,000 Units by mouth daily.   Yes [provider]  docusate sodium (COLACE) 100 MG capsule Take 100 mg by mouth 2 (two) times daily.    Yes [provider]  dofetilide (TIKOSYN) 500 MCG capsule TAKE 1 CAPSULE TWICE DAILY 12/05/20  Yes Sherran Needs, NP  metoprolol tartrate (LOPRESSOR) 25 MG tablet TAKE 1/2 TABLET TWICE DAILY 08/31/20  Yes Sherran Needs, NP  Multiple Vitamin (MULTIVITAMIN) tablet Take 1 tablet by mouth daily.   Yes [provider]  Multiple Vitamins-Minerals (PRESERVISION AREDS 2 PO) Take 1 capsule by mouth 2 (two) times daily.    Yes [provider]  potassium chloride SA (KLOR-CON) 20 MEQ tablet TAKE 2 TABLETS TWICE A DAY 02/11/21  Yes Sherran Needs, NP  pravastatin (PRAVACHOL) 20 MG tablet TAKE 1 TABLET EVERY DAY 09/03/20  Yes Burns, Claudina Lick, MD  sildenafil (VIAGRA) 100 MG tablet Take 100 mg by mouth daily as needed for erectile dysfunction.   Yes Irine Seal, MD  ELIQUIS 5 MG TABS tablet TAKE 1 TABLET TWICE DAILY 08/09/20   Sherran Needs, NP  GLUCOSAMINE SULFATE PO Take 2 tablets by mouth daily.  [provider]    Current Outpatient Medications  Medication Sig Dispense Refill   acetaminophen (TYLENOL) 500 MG tablet Take 500 mg by mouth every 6 (six) hours as needed for moderate pain or headache.      acyclovir (ZOVIRAX) 200 MG capsule TAKE 2 CAPSULES EVERY DAY 180 capsule 1   amLODipine (NORVASC) 5 MG tablet TAKE 1 AND 1/2 TABLETS EVERY DAY 135 tablet 1   Ascorbic Acid (VITAMIN C) 1000 MG tablet Take 1,000 mg by mouth daily.     Cholecalciferol (VITAMIN D) 50 MCG (2000 UT) tablet Take 2,000 Units by mouth daily.     docusate sodium (COLACE) 100 MG capsule Take 100 mg by mouth 2 (two) times daily.       dofetilide (TIKOSYN) 500 MCG capsule TAKE 1 CAPSULE TWICE DAILY 180 capsule 2   metoprolol tartrate (LOPRESSOR) 25 MG tablet TAKE 1/2 TABLET TWICE DAILY 90 tablet 1   Multiple Vitamin (MULTIVITAMIN) tablet Take 1 tablet by mouth daily.     Multiple Vitamins-Minerals (PRESERVISION AREDS 2 PO) Take 1 capsule by mouth 2 (two) times daily.      potassium chloride SA (KLOR-CON) 20 MEQ tablet TAKE 2 TABLETS TWICE A DAY 360 tablet 3   pravastatin (PRAVACHOL) 20 MG tablet TAKE 1 TABLET EVERY DAY 90 tablet 1   sildenafil (VIAGRA) 100 MG tablet Take 100 mg by mouth daily as needed for erectile dysfunction.     ELIQUIS 5 MG TABS tablet TAKE 1 TABLET TWICE DAILY 180 tablet 3   GLUCOSAMINE SULFATE PO Take 2 tablets by mouth daily.      Current Facility-Administered Medications  Medication Dose Route Frequency Provider Last Rate Last Admin   0.9 %  sodium chloride infusion  500 mL Intravenous Once Ladene Artist, MD        Allergies as of 04/02/2021   (No Known Allergies)    Family History  Problem Relation Age of Onset   Hypertension Mother    Congestive Heart Failure Mother    Prostate cancer Father    Hypertension Father    Melanoma Sister    Lupus Sister    Prostate cancer Brother        mets to liver   Liver cancer Brother    Atrial fibrillation Brother    Melanoma Brother    Diabetes Neg Hx    Heart disease Neg Hx    Stroke Neg Hx    Colon cancer Neg Hx     Social History   Socioeconomic History   Marital status: Married    Spouse name: Not on file   Number of children: 3   Years of education: Not on file   Highest education level: Not on file  Occupational History   Occupation: retired   Tobacco Use   Smoking status: Former    Packs/day: 1.00    Years: 7.00    Pack years: 7.00    Types: Cigarettes    Quit date: 1968    Years since quitting: 54.9   Smokeless tobacco: Never   Tobacco comments:    smoked cigarettes age [age], up to 1ppd  Vaping Use   Vaping Use:  Never used  Substance and Sexual Activity   Alcohol use: Yes    Alcohol/week: 2.0 standard drinks    Types: 1 Glasses of wine, 1 Cans of beer per week    Comment: less than 1 drink per week   Drug use: No   Sexual activity: Yes  Other Topics Concern   Not on file  Social History Narrative   Not on file   Social Determinants of Health   Financial Resource Strain: Low Risk    Difficulty of Paying Living Expenses: Not hard at all  Food Insecurity: No Food Insecurity   Worried About Charity fundraiser in the Last Year: Never true   [place] in the Last Year: Never true  Transportation Needs: No Transportation Needs   Lack of Transportation (Medical): No   Lack of Transportation (Non-Medical): No  Physical Activity: Sufficiently Active   Days of Exercise per Week: 5 days   Minutes of Exercise per Session: 30 min  Stress: No Stress Concern Present   Feeling of Stress : Not at all  Social Connections: Socially Integrated   Frequency of Communication with Friends and Family: More than three times a week   Frequency of Social Gatherings with Friends and Family: More than three times a week   Attends Religious Services: More than 4 times per year   Active Member of [employer] or Organizations: Yes   Attends Music therapist: More than 4 times per year   Marital Status: Married  Human resources officer Violence: Not At Risk   Fear of Current or Ex-Partner: No   Emotionally Abused: No   Physically Abused: No   Sexually Abused: No    Review of Systems:  All systems reviewed an negative except where noted in HPI.  Gen: Denies any fever, chills, sweats, anorexia, fatigue, weakness, malaise, weight loss, and sleep disorder CV: Denies chest pain, angina, palpitations, syncope, orthopnea, PND, peripheral edema, and claudication. Resp: Denies dyspnea at rest, dyspnea with exercise, cough, sputum, wheezing, coughing up blood, and pleurisy. GI: Denies vomiting blood, jaundice,  and fecal incontinence.   Denies dysphagia or odynophagia. GU : Denies urinary burning, blood in urine, urinary frequency, urinary hesitancy, nocturnal urination, and urinary incontinence. MS: Denies joint pain, limitation of movement, and swelling, stiffness, low back pain, extremity pain. Denies muscle weakness, cramps, atrophy.  Derm: Denies rash, itching, dry skin, hives, moles, warts, or unhealing ulcers.  Psych: Denies depression, anxiety, memory loss, suicidal ideation, hallucinations, paranoia, and confusion. Heme: Denies bruising, bleeding, and enlarged lymph nodes. Neuro:  Denies any headaches, dizziness, paresthesias. Endo:  Denies any problems with DM, thyroid, adrenal function.   Physical Exam: General:  Alert, well-developed, in NAD Head:  Normocephalic and atraumatic. Eyes:  Sclera clear, no icterus.   Conjunctiva pink. Ears:  Normal auditory acuity. Mouth:  No deformity or lesions.  Neck:  Supple; no masses . Lungs:  Clear throughout to auscultation.   No wheezes, crackles, or rhonchi. No acute distress. Heart:  Regular rate and rhythm; no murmurs. Abdomen:  Soft, nondistended, nontender. No masses, hepatomegaly. No obvious masses.  Normal bowel .    Rectal:  Deferred   Msk:  Symmetrical without gross deformities.. Pulses:  Normal pulses noted. Extremities:  Without edema. Neurologic:  Alert and  oriented x4;  grossly normal neurologically. Skin:  Intact without significant lesions or rashes. Cervical Nodes:  No significant cervical adenopathy. Psych:  Alert and cooperative. Normal mood and affect.   Impression / Plan:   Hematochezia, small volume, and a personal history of adenomatous colon polyps for colonoscopy.  Eliquis was held 2 days prior to the procedure.   Pricilla Riffle. Fuller Plan  04/02/2021, 3:28 PM See Shea Evans, [facility], to contact our on call provider

## 2021-04-02 NOTE — Patient Instructions (Signed)
Handouts on Diverticulosis, hemorrhoids, and hemorrhoid treatment brochure given. May resume Eliquis today at prior dose.    YOU HAD AN ENDOSCOPIC PROCEDURE TODAY AT [facility]:   Refer to the procedure report that was given to you for any specific questions about what was found during the examination.  If the procedure report does not answer your questions, please call your gastroenterologist to clarify.  If you requested that your care partner not be given the details of your procedure findings, then the procedure report has been included in a sealed envelope for you to review at your convenience later.  YOU SHOULD EXPECT: Some feelings of bloating in the abdomen. Passage of more gas than usual.  Walking can help get rid of the air that was put into your GI tract during the procedure and reduce the bloating. If you had a lower endoscopy (such as a colonoscopy or flexible sigmoidoscopy) you may notice spotting of blood in your stool or on the toilet paper. If you underwent a bowel prep for your procedure, you may not have a normal bowel movement for a few days.  Please Note:  You might notice some irritation and congestion in your nose or some drainage.  This is from the oxygen used during your procedure.  There is no need for concern and it should clear up in a day or so.  SYMPTOMS TO REPORT IMMEDIATELY:  Following lower endoscopy (colonoscopy or flexible sigmoidoscopy):  Excessive amounts of blood in the stool  Significant tenderness or worsening of abdominal pains  Swelling of the abdomen that is new, acute  Fever of 100F or higher   For urgent or emergent issues, a gastroenterologist can be reached at any hour by calling [phone]. Do not use MyChart messaging for urgent concerns.    DIET:  We do recommend a small meal at first, but then you may proceed to your regular diet.  Drink plenty of fluids but you should avoid alcoholic beverages for 24  hours.  ACTIVITY:  You should plan to take it easy for the rest of today and you should NOT DRIVE or use heavy machinery until tomorrow (because of the sedation medicines used during the test).    FOLLOW UP: Our staff will call the number listed on your records 48-72 hours following your procedure to check on you and address any questions or concerns that you may have regarding the information given to you following your procedure. If we do not reach you, we will leave a message.  We will attempt to reach you two times.  During this call, we will ask if you have developed any symptoms of COVID 19. If you develop any symptoms (ie: fever, flu-like symptoms, shortness of breath, cough etc.) before then, please call [phone].  If you test positive for Covid 19 in the 2 weeks post procedure, please call and report this information to [place].    If any biopsies were taken you will be contacted by phone or by letter within the next 1-3 weeks.  Please call us at [phone] if you have not heard about the biopsies in 3 weeks.    SIGNATURES/CONFIDENTIALITY: You and/or your care partner have signed paperwork which will be entered into your electronic medical record.  These signatures attest to the fact that that the information above on your After Visit Summary has been reviewed and is understood.  Full responsibility of the confidentiality of this discharge information lies with you and/or your care-partner.

## 2021-04-02 NOTE — Op Note (Signed)
[facility] Patient Name: Daniel Tucker Procedure Date: 04/02/2021 3:32 PM MRN: [id number] Endoscopist: Ladene Artist , MD Age: 80 Referring MD:  Date of Birth: 17-Mar-1941 Gender: Male Account #: [id number] Procedure:                Colonoscopy Indications:              Hematochezia, Personal history of adenomatous colon                            polyps 5 year ago. Medicines:                Monitored Anesthesia Care Procedure:                Pre-Anesthesia Assessment:                           - Prior to the procedure, a History and Physical                            was performed, and patient medications and                            allergies were reviewed. The patient's tolerance of                            previous anesthesia was also reviewed. The risks                            and benefits of the procedure and the sedation                            options and risks were discussed with the patient.                            All questions were answered, and informed consent                            was obtained. Prior Anticoagulants: The patient has                            taken Eliquis (apixaban), last dose was 2 days                            prior to procedure. ASA Grade Assessment: III - A                            patient with severe systemic disease. After                            reviewing the risks and benefits, the patient was                            deemed in satisfactory condition to undergo the  procedure.                           After obtaining informed consent, the colonoscope                            was passed under direct vision. Throughout the                            procedure, the patient's blood pressure, pulse, and                            oxygen saturations were monitored continuously. The                            Olympus CF-HQ190L [id number]) Colonoscope was                             introduced through the anus and advanced to the the                            cecum, identified by appendiceal orifice and                            ileocecal valve. The ileocecal valve, appendiceal                            orifice, and rectum were photographed. The quality                            of the bowel preparation was good. The colonoscopy                            was performed without difficulty. The patient                            tolerated the procedure well. Scope In: 3:41:21 PM Scope Out: 3:53:09 PM Scope Withdrawal Time: 0 hours 9 minutes 44 seconds  Total Procedure Duration: 0 hours 11 minutes 48 seconds  Findings:                 The perianal and digital rectal examinations were                            normal.                           Multiple medium-mouthed diverticula were found in                            the left colon. There was no evidence of                            diverticular bleeding.  A single medium-mouthed diverticulum was found in                            the ascending colon. There was evidence of an                            impacted diverticulum.                           Internal hemorrhoids were found during                            retroflexion. The hemorrhoids were moderate and                            Grade I (internal hemorrhoids that do not prolapse).                           The exam was otherwise without abnormality on                            direct and retroflexion views. Complications:            No immediate complications. Estimated blood loss:                            None. Estimated Blood Loss:     Estimated blood loss: none. Impression:               - Mild diverticulosis in the left colon.                           - Single diverticulum in the ascending colon.                           - Internal hemorrhoids.                           - The examination was otherwise normal on  direct                            and retroflexion views.                           - No specimens collected. Recommendation:           - Resume Eliquis (apixaban) today at prior dose.                            Refer to managing physician for further adjustment                            of therapy.                           - Patient has a contact number available for  emergencies. The signs and symptoms of potential                            delayed complications were discussed with the                            patient. Return to normal activities tomorrow.                            Written discharge instructions were provided to the                            patient.                           - High fiber diet.                           - Continue present medications.                           - Await pathology results.                           - Recommend repeat hemorrhoid banding.                           - No repeat colonoscopy due to age and absence of                            polyps. Ladene Artist, MD 04/02/2021 3:58:34 PM This report has been signed electronically.

## 2021-04-02 NOTE — Progress Notes (Signed)
Report given to PACU, vss 

## 2021-04-04 ENCOUNTER — Telehealth: Payer: Self-pay | Admitting: *Deleted

## 2021-04-04 ENCOUNTER — Telehealth: Payer: Self-pay

## 2021-04-04 NOTE — Telephone Encounter (Signed)
°  Follow up Call-  Call back number 04/02/2021  Post procedure Call Back phone  # [phone]  Permission to leave phone message Yes  Some recent data might be hidden     Patient questions:  Do you have a fever, pain , or abdominal swelling? No. Pain Score  0 *  Have you tolerated food without any problems? Yes.    Have you been able to return to your normal activities? Yes.    Do you have any questions about your discharge instructions: Diet   No. Medications  No. Follow up visit  No.  Do you have questions or concerns about your Care? No.  Actions: * If pain score is 4 or above: No action needed, pain <4.  Have you developed a fever since your procedure? no  2.   Have you had an respiratory symptoms (SOB or cough) since your procedure? no  3.   Have you tested positive for COVID 19 since your procedure no  4.   Have you had any family members/close contacts diagnosed with the COVID 19 since your procedure?  no   If yes to any of these questions please route to Joylene John, RN and Joella Prince, RN

## 2021-04-04 NOTE — Telephone Encounter (Signed)
Spoke with patient about Dr. Lynne Leader recommendation from colonoscopy done on 04/02/21 for a repeat hemorrhoid banding. Patient agreed and was scheduled for hemorrhoid banding with Dr. Fuller Plan on 05/16/21 at 3:40 pm.

## 2021-04-04 NOTE — Telephone Encounter (Signed)
°  Follow up Call-  Call back number 04/02/2021  Post procedure Call Back phone  # [phone]  Permission to leave phone message Yes  Some recent data might be hidden     Patient questions:  Do you have a fever, pain , or abdominal swelling? No. Pain Score  0 *  Have you tolerated food without any problems? Yes.    Have you been able to return to your normal activities? Yes.    Do you have any questions about your discharge instructions: Diet   No. Medications  No. Follow up visit  No.  Do you have questions or concerns about your Care? No.  Actions: * If pain score is 4 or above: No action needed, pain <4.  Have you developed a fever since your procedure? no  2.   Have you had an respiratory symptoms (SOB or cough) since your procedure? no  3.   Have you tested positive for COVID 19 since your procedure no  4.   Have you had any family members/close contacts diagnosed with the COVID 19 since your procedure?  no   If yes to any of these questions please route to Joylene John, RN and Joella Prince, RN

## 2021-04-08 ENCOUNTER — Other Ambulatory Visit: Payer: Self-pay

## 2021-04-08 ENCOUNTER — Encounter (HOSPITAL_COMMUNITY): Payer: Self-pay | Admitting: Nurse Practitioner

## 2021-04-08 ENCOUNTER — Ambulatory Visit (HOSPITAL_COMMUNITY)
Admission: RE | Admit: 2021-04-08 | Discharge: 2021-04-08 | Disposition: A | Discharge status: Home / Self Care | Payer: Medicare Other | Source: Ambulatory Visit | Attending: Nurse Practitioner | Admitting: Nurse Practitioner

## 2021-04-08 VITALS — BP 150/82 | HR 55 | Ht 66.0 in | Wt 182.0 lb

## 2021-04-08 DIAGNOSIS — Z79899 Other long term (current) drug therapy: Secondary | ICD-10-CM | POA: Diagnosis not present

## 2021-04-08 DIAGNOSIS — Z87891 Personal history of nicotine dependence: Secondary | ICD-10-CM | POA: Diagnosis not present

## 2021-04-08 DIAGNOSIS — I480 Paroxysmal atrial fibrillation: Secondary | ICD-10-CM | POA: Insufficient documentation

## 2021-04-08 DIAGNOSIS — D6869 Other thrombophilia: Secondary | ICD-10-CM | POA: Diagnosis not present

## 2021-04-08 DIAGNOSIS — G4733 Obstructive sleep apnea (adult) (pediatric): Secondary | ICD-10-CM | POA: Insufficient documentation

## 2021-04-08 DIAGNOSIS — Z7901 Long term (current) use of anticoagulants: Secondary | ICD-10-CM | POA: Diagnosis not present

## 2021-04-08 DIAGNOSIS — Z09 Encounter for follow-up examination after completed treatment for conditions other than malignant neoplasm: Secondary | ICD-10-CM | POA: Diagnosis not present

## 2021-04-08 DIAGNOSIS — I10 Essential (primary) hypertension: Secondary | ICD-10-CM | POA: Diagnosis not present

## 2021-04-08 LAB — BASIC METABOLIC PANEL
Anion gap: 7 (ref 5–15)
BUN: 18 mg/dL (ref 8–23)
CO2: 28 mmol/L (ref 22–32)
Calcium: 9.1 mg/dL (ref 8.9–10.3)
Chloride: 103 mmol/L (ref 98–111)
Creatinine, Ser: 1.2 mg/dL (ref 0.61–1.24)
GFR, Estimated: >60 mL/min (ref >60)
Glucose, Bld: 128 mg/dL — ABNORMAL HIGH (ref 70–99)
Potassium: 4 mmol/L (ref 3.5–5.1)
Sodium: 138 mmol/L (ref 135–145)

## 2021-04-08 LAB — MAGNESIUM: Magnesium: 1.9 mg/dL (ref 1.7–2.4)

## 2021-04-08 MED ORDER — AMLODIPINE BESYLATE 10 MG PO TABS: 10.0000 mg | ORAL_TABLET | Freq: Every day | ORAL | 3 refills | Status: DC

## 2021-04-08 NOTE — Progress Notes (Signed)
Patient ID: Daniel Tucker, male   DOB: October 28, 1940, 80 y.o.   MRN: [id number]     Primary Care Physician: Binnie Rail, MD Referring Physician: Dr. Jovita Kussmaul is a 80 y.o. male with a h/o paroxysmal afib maintaining SR on tikosyn. He is in the afib clinic for f/u surveillance of Tikosyn. He did go thru a hip replacement in February 2020 without any issues. Short episode  afib lst week that responded well to extra dose of BB. Pending 2nd covid shot 3/3. Qtc is stable.   F/u in the afib clinic, 8/26, for surveillance ofTikosyn. He forgot his Tikosyn a couple of weeks ago and did have breakthrough afib.He did convert the next day. No further  afib.   F/u in afib clinic 10/03/20. He states that he has been staying in [place]. Rare episode of afib that resolves with extra metoprolol. He has lost some weight as he got a new puppy and is walking outside with it frequently. No issues with anticoagulation.   F/u in afib clinic, 12/19, no afib to report. His BP's at home are typically running 086-578 systolic. BP is  150/82 bpm . Discussed increasing amlodipine to 10 mg daily to better control BP. He is compliant with meds.   Today, he denies symptoms of palpitations, chest pain, shortness of breath, orthopnea, PND, lower extremity edema, dizziness, presyncope, syncope, or neurologic sequela. The patient is tolerating medications without difficulties and is otherwise without complaint today.   Past Medical History:  Diagnosis Date   Arthritis    "hips" (11/20/2014)   Benign essential HTN    Fasting hyperglycemia 2006   FBS 111; normal A1c   Hepatitis ~ 1955   "yellow juandice"   Hyperlipidemia    Internal hemorrhoids    Persistent atrial fibrillation ([place]) 08/24/2014   CHASD2VASC score is 3 - on Apixaban   Sleep apnea    intolerant to CPAP and now using oral device followed by Dr. Toy Cookey   Tubular adenoma of colon 2006   Dr Fuller Plan   Past Surgical History:  Procedure Laterality  Date   CARDIOVERSION N/A 10/12/2014   Procedure: CARDIOVERSION;  Surgeon: Darlin Coco, MD;  Location: [facility];  Service: Cardiovascular;  Laterality: N/A;   COLONOSCOPY  2011   no polyp   COLONOSCOPY W/ POLYPECTOMY  2006   [facility]   NASAL SINUS SURGERY  1997   PROSTATE BIOPSY  2010   Negative,Dr.Ottelin   TONSILLECTOMY AND ADENOIDECTOMY  1997   TOTAL HIP ARTHROPLASTY Left 08/2010   Dr Maureen Ralphs   TOTAL HIP ARTHROPLASTY Right 05/17/2018   Procedure: TOTAL HIP ARTHROPLASTY ANTERIOR APPROACH;  Surgeon: Gaynelle Arabian, MD;  Location: [facility];  Service: Orthopedics;  Laterality: Right;   UVULOPALATOPHARYNGOPLASTY  1997   Dr.Woliki    Current Outpatient Medications  Medication Sig Dispense Refill   acetaminophen (TYLENOL) 500 MG tablet Take 500 mg by mouth every 6 (six) hours as needed for moderate pain or headache.      acyclovir (ZOVIRAX) 200 MG capsule TAKE 2 CAPSULES EVERY DAY 180 capsule 1   Ascorbic Acid (VITAMIN C) 1000 MG tablet Take 1,000 mg by mouth daily.     Cholecalciferol (VITAMIN D) 50 MCG (2000 UT) tablet Take 2,000 Units by mouth daily.     docusate sodium (COLACE) 100 MG capsule Take 100 mg by mouth 2 (two) times daily.      dofetilide (TIKOSYN) 500 MCG capsule TAKE 1 CAPSULE TWICE DAILY 180 capsule 2  ELIQUIS 5 MG TABS tablet TAKE 1 TABLET TWICE DAILY 180 tablet 3   GLUCOSAMINE SULFATE PO Take 2 tablets by mouth daily.      metoprolol tartrate (LOPRESSOR) 25 MG tablet TAKE 1/2 TABLET TWICE DAILY 90 tablet 1   Multiple Vitamin (MULTIVITAMIN) tablet Take 1 tablet by mouth daily.     Multiple Vitamins-Minerals (PRESERVISION AREDS 2 PO) Take 1 capsule by mouth 2 (two) times daily.      potassium chloride SA (KLOR-CON) 20 MEQ tablet TAKE 2 TABLETS TWICE A DAY 360 tablet 3   pravastatin (PRAVACHOL) 20 MG tablet TAKE 1 TABLET EVERY DAY 90 tablet 1   sildenafil (VIAGRA) 100 MG tablet Take 100 mg by mouth daily as needed for erectile dysfunction.     amLODipine (NORVASC)  10 MG tablet Take 1 tablet (10 mg total) by mouth daily. 90 tablet 3   No current facility-administered medications for this encounter.    No Known Allergies  Social History   Socioeconomic History   Marital status: Married    Spouse name: Not on file   Number of children: 3   Years of education: Not on file   Highest education level: Not on file  Occupational History   Occupation: retired   Tobacco Use   Smoking status: Former    Packs/day: 1.00    Years: 7.00    Pack years: 7.00    Types: Cigarettes    Quit date: 1968    Years since quitting: 55.0   Smokeless tobacco: Never   Tobacco comments:    smoked cigarettes age [age], up to 1ppd  Vaping Use   Vaping Use: Never used  Substance and Sexual Activity   Alcohol use: Yes    Alcohol/week: 2.0 standard drinks    Types: 1 Glasses of wine, 1 Cans of beer per week    Comment: less than 1 drink per week   Drug use: No   Sexual activity: Yes  Other Topics Concern   Not on file  Social History Narrative   Not on file   Social Determinants of Health   Financial Resource Strain: Low Risk    Difficulty of Paying Living Expenses: Not hard at all  Food Insecurity: No Food Insecurity   Worried About Charity fundraiser in the Last Year: Never true   Ran Out of Food in the Last Year: Never true  Transportation Needs: No Transportation Needs   Lack of Transportation (Medical): No   Lack of Transportation (Non-Medical): No  Physical Activity: Sufficiently Active   Days of Exercise per Week: 5 days   Minutes of Exercise per Session: 30 min  Stress: No Stress Concern Present   Feeling of Stress : Not at all  Social Connections: Socially Integrated   Frequency of Communication with Friends and Family: More than three times a week   Frequency of Social Gatherings with Friends and Family: More than three times a week   Attends Religious Services: More than 4 times per year   Active Member of [employer] or Organizations: Yes    Attends Music therapist: More than 4 times per year   Marital Status: Married  Human resources officer Violence: Not At Risk   Fear of Current or Ex-Partner: No   Emotionally Abused: No   Physically Abused: No   Sexually Abused: No    Family History  Problem Relation Age of Onset   Hypertension Mother    Congestive Heart Failure Mother    Prostate  cancer Father    Hypertension Father    Melanoma Sister    Lupus Sister    Prostate cancer Brother        mets to liver   Liver cancer Brother    Atrial fibrillation Brother    Melanoma Brother    Diabetes Neg Hx    Heart disease Neg Hx    Stroke Neg Hx    Colon cancer Neg Hx     ROS- All systems are reviewed and negative except as per the HPI above  Physical Exam: Vitals:   04/08/21 0958  BP: (!) 150/82  Pulse: (!) 55  Weight: 82.6 kg  Height: _0  (1.676 m)    GEN- The patient is well appearing, alert and oriented x 3 today.   Head- normocephalic, atraumatic Eyes-  Sclera clear, conjunctiva pink Ears- hearing intact Oropharynx- clear Neck- supple, no JVP Lymph- no cervical lymphadenopathy Lungs- Clear to ausculation bilaterally, normal work of breathing Heart- regular rate and rhythm  no murmurs, rubs or gallops, PMI not laterally displaced GI- soft, NT, ND, + BS Extremities- no clubbing, cyanosis, or edema MS- no significant deformity or atrophy Skin- no rash or lesion Psych- euthymic mood, full affect Neuro- strength and sensation are intact  EKG- sinus brady at 55  bpm, with sinus arrhythmia,  pr int 150 ms, qrs int 140 ms, qtc 447  ms Epic records reviewed.  Assessment and Plan: 1. Afib  Low afib burden Continue on tikosyn 500 mg bid  qtc stable Continue apixaban 5 mg bid, no bleeding issues Bmet/mag today   2.OSA Still wearing mouthpiece  3. HTN   Elevated here and at home  Increase amlodipine to 10 mg daily  Watch BP's at home and let us know if BP's are not consistently  under 110  systolic   4. Asymptomatic brady HR stable's in the 50's    F/u in 6 months    Butch Penny C. Saraann Enneking, [facility] [address] [place], [place] [address] [phone]

## 2021-04-08 NOTE — Patient Instructions (Signed)
Increase Amlodipine to 10 mg Daily, we have sent a new prescription for 10 mg tablets to [facility] physician has requested that you regularly monitor and record your blood pressure readings at home. Please use the same machine at the same time of day to check your readings and record them to bring to your follow-up visit. Please let us know if it continues to run high  Your physician recommends that you schedule a follow-up appointment in: 6 months, we will call you closer to that time to schedule

## 2021-05-02 ENCOUNTER — Ambulatory Visit (INDEPENDENT_AMBULATORY_CARE_PROVIDER_SITE_OTHER): Payer: Medicare Other

## 2021-05-02 DIAGNOSIS — E782 Mixed hyperlipidemia: Secondary | ICD-10-CM

## 2021-05-02 DIAGNOSIS — I4891 Unspecified atrial fibrillation: Secondary | ICD-10-CM

## 2021-05-02 DIAGNOSIS — I10 Essential (primary) hypertension: Secondary | ICD-10-CM

## 2021-05-02 NOTE — Progress Notes (Signed)
Chronic Care Management Pharmacy Note  05/02/2021 Name:  Daniel Tucker MRN:  [id number] DOB:  27-Apr-1940   Summary: -Patient recently had amlodipine increased, notes that since increasing medication BP has improved - averaging 130-140/80's with HR in the 60's - denies any issues since increasing medication -Denies any issues with abnormal bruising or bleeding at this time  -No current issues or concerns with any of his medications, reports adherence to all medications   Recommendations/Changes made from today's visit: -Recommending for patient to continue daily BP checks - reach out to office / cardiology should BP be elevated from goal -Continue current medications   Plan: F/u in 6 months   Subjective: Daniel Tucker is an 81 y.o. year old male who is a primary patient of Burns, Claudina Lick, MD.  The CCM team was consulted for assistance with disease management and care coordination needs.    Engaged with patient by telephone for follow up visit in response to provider referral for pharmacy case management and/or care coordination services.   Consent to Services:  The patient was given the following information about Chronic Care Management services today, agreed to services, and gave verbal consent: 1. CCM service includes personalized support from designated clinical staff supervised by the primary care provider, including individualized plan of care and coordination with other care providers 2. 24/7 contact phone numbers for assistance for urgent and routine care needs. 3. Service will only be billed when office clinical staff spend 20 minutes or more in a month to coordinate care. 4. Only one practitioner may furnish and bill the service in a calendar month. 5.The patient may stop CCM services at any time (effective at the end of the month) by phone call to the office staff. 6. The patient will be responsible for cost sharing (co-pay) of up to 20% of the service fee (after annual  deductible is met). Patient agreed to services and consent obtained.  Patient Care Team: Binnie Rail, MD as PCP - General (Internal Medicine) Gaynelle Arabian, MD as Consulting Physician (Orthopedic Surgery) Freada Bergeron, CMA as Technician Melida Quitter, MD as Consulting Physician (Otolaryngology) Ladene Artist, MD as Consulting Physician (Gastroenterology) Thompson Grayer, MD as Consulting Physician (Cardiology) Charlton Haws, [facility] as Pharmacist (Pharmacist) Willow Ora as Consulting Physician (Optometry)  Patient lives at home with his wife.  Recent office visits: None since last visit   Recent consult visits: 04/08/2021 - Doristine Devoid NP - Cardiology - increase amlodipine to 90m daily  04/02/2021 - Colonoscopy procedure  02/19/2021 - CCarl Best- Gastroenterology - colonoscopy ordered  10/03/2020 - DRoderic PalauNP - Cardiology - no changes to medications   Hospital visits: 12/23/2020 - Urgent care visit for eye pain - prescribed cefadroxil and erythromycin eye ointment x 1 week  Objective:  Lab Results  Component Value Date   CREATININE 1.20 04/08/2021   BUN 18 04/08/2021   GFR 69.83 07/11/2019   GFRNONAA >60 04/08/2021   GFRAA 79 01/11/2020   NA 138 04/08/2021   K 4.0 04/08/2021   CALCIUM 9.1 04/08/2021   CO2 28 04/08/2021   GLUCOSE 128 (H) 04/08/2021    Lab Results  Component Value Date/Time   HGBA1C 5.8 (H) 01/11/2020 11:40 AM   HGBA1C 5.9 07/11/2019 09:52 AM   GFR 69.83 07/11/2019 09:52 AM   GFR 65.61 05/11/2018 08:08 AM    Last diabetic Eye exam: No results found for: HMDIABEYEEXA  Last diabetic Foot exam: No results found for: HMDIABFOOTEX  Lab Results  Component Value Date   CHOL 154 01/11/2020   HDL 45 01/11/2020   LDLCALC 86 01/11/2020   TRIG 130 01/11/2020   CHOLHDL 3.4 01/11/2020    Hepatic Function Latest Ref Rng & Units 01/11/2020 07/11/2019 05/11/2018  Total Protein 6.1 - 8.1 g/dL 7.2 7.3 7.6  Albumin 3.5 - 5.2 g/dL -  4.3 4.5  AST 10 - 35 U/L _0 ALT 9 - 46 U/L _1 Alk Phosphatase 39 - 117 U/L - 73 71  Total Bilirubin 0.2 - 1.2 mg/dL 0.6 0.7 0.8  Bilirubin, Direct 0.0 - 0.3 mg/dL - - -    Lab Results  Component Value Date/Time   TSH 1.31 01/11/2020 11:40 AM   TSH 1.08 07/11/2019 09:52 AM   FREET4 0.83 08/24/2014 10:42 AM    CBC Latest Ref Rng & Units 02/19/2021 01/11/2020 06/17/2019  WBC 4.0 - 10.5 K/uL 5.6 7.7 5.2  Hemoglobin 13.0 - 17.0 g/dL 13.8 14.5 14.8  Hematocrit 39.0 - 52.0 % 41.6 42.8 46.1  Platelets 150.0 - 400.0 K/uL 181.0 221 216    No results found for: VD25OH  Clinical ASCVD: No  The 10-year ASCVD risk score (Arnett DK, et al., 2019) is: 43.1%   Values used to calculate the score:     Age: 81 years     Sex: Male     Is Non-Hispanic African American: No     Diabetic: No     Tobacco smoker: No     Systolic Blood Pressure: 224 mmHg     Is BP treated: Yes     HDL Cholesterol: 45 mg/dL     Total Cholesterol: 154 mg/dL    CHA2DS2-VASc Score = 3  The patient's score is based upon: CHF History: 0 HTN History: 1 Diabetes History: 0 Stroke History: 0 Vascular Disease History: 0 Age Score: 2 Gender Score: 0      Depression screen [facility] 2/9 03/25/2021 01/11/2020 05/18/2019  Decreased Interest 0 0 0  Down, Depressed, Hopeless 0 0 0  PHQ - 2 Score 0 0 0     Social History   Tobacco Use  Smoking Status Former   Packs/day: 1.00   Years: 7.00   Pack years: 7.00   Types: Cigarettes   Quit date: 1968   Years since quitting: 55.0  Smokeless Tobacco Never  Tobacco Comments   smoked cigarettes age [age], up to 1ppd   BP Readings from Last 3 Encounters:  04/08/21 (!) 150/82  04/02/21 131/68  02/19/21 (!) 142/70   Pulse Readings from Last 3 Encounters:  04/08/21 (!) 55  04/02/21 65  02/19/21 60   Wt Readings from Last 3 Encounters:  04/08/21 182 lb (82.6 kg)  04/02/21 181 lb (82.1 kg)  02/19/21 181 lb 6 oz (82.3 kg)   BMI Readings from Last 3  Encounters:  04/08/21 29.38 kg/m  04/02/21 29.21 kg/m  02/19/21 28.84 kg/m    Assessment/Interventions: Review of patient past medical history, allergies, medications, health status, including review of consultants reports, laboratory and other test data, was performed as part of comprehensive evaluation and provision of chronic care management services.   SDOH:  (Social Determinants of Health) assessments and interventions performed: Yes   SDOH Screenings   Alcohol Screen: Low Risk    Last Alcohol Screening Score (AUDIT): 1  Depression (PHQ2-9): Low Risk    PHQ-2 Score: 0  Financial Resource Strain: Low Risk    Difficulty of Paying Living Expenses: Not  hard at all  Food Insecurity: No Food Insecurity   Worried About Charity fundraiser in the Last Year: Never true   Ran Out of Food in the Last Year: Never true  Housing: Low Risk    Last Housing Risk Score: 0  Physical Activity: Sufficiently Active   Days of Exercise per Week: 5 days   Minutes of Exercise per Session: 30 min  Social Connections: Engineer, building services of Communication with Friends and Family: More than three times a week   Frequency of Social Gatherings with Friends and Family: More than three times a week   Attends Religious Services: More than 4 times per year   Active Member of [employer] or Organizations: Yes   Attends Music therapist: More than 4 times per year   Marital Status: Married  Stress: No Stress Concern Present   Feeling of Stress : Not at all  Tobacco Use: Medium Risk   Smoking Tobacco Use: Former   Smokeless Tobacco Use: Never   Passive Exposure: Not on Pensions consultant Needs: No Transportation Needs   Lack of Transportation (Medical): No   Lack of Transportation (Non-Medical): No    CCM Care Plan  No Known Allergies  Medications Reviewed Today     Reviewed by Tomasa Blase, [facility] (Pharmacist) on 05/02/21 at 1349  Med List Status: <None>   Medication  Order Taking? Sig Documenting Provider Last Dose Status Informant  acetaminophen (TYLENOL) 500 MG tablet [id number] Yes Take 500 mg by mouth every 6 (six) hours as needed for moderate pain or headache.  [provider] Taking Active Self  acyclovir (ZOVIRAX) 200 MG capsule [id number] Yes TAKE 2 CAPSULES EVERY DAY Burns, Claudina Lick, MD Taking Active   amLODipine (NORVASC) 10 MG tablet [id number] Yes Take 1 tablet (10 mg total) by mouth daily. Sherran Needs, NP Taking Active   Ascorbic Acid (VITAMIN C) 1000 MG tablet [id number] Yes Take 1,000 mg by mouth daily. [provider] Taking Active Self  Cholecalciferol (VITAMIN D) 50 MCG (2000 UT) tablet [id number] Yes Take 2,000 Units by mouth daily. [provider] Taking Active   docusate sodium (COLACE) 100 MG capsule [id number] Yes Take 100 mg by mouth 2 (two) times daily.  [provider] Taking Active Self  dofetilide (TIKOSYN) 500 MCG capsule [id number] Yes TAKE 1 CAPSULE TWICE DAILY Sherran Needs, NP Taking Active   ELIQUIS 5 MG TABS tablet [id number] Yes TAKE 1 TABLET TWICE DAILY Sherran Needs, NP Taking Active   GLUCOSAMINE SULFATE PO [id number] Yes Take 2 tablets by mouth daily.  [provider] Taking Active Self  metoprolol tartrate (LOPRESSOR) 25 MG tablet [id number] Yes TAKE 1/2 TABLET TWICE DAILY Sherran Needs, NP Taking Active   Multiple Vitamin (MULTIVITAMIN) tablet [id number] Yes Take 1 tablet by mouth daily. [provider] Taking Active Self  Multiple Vitamins-Minerals (PRESERVISION AREDS 2 PO) [id number] Yes Take 1 capsule by mouth 2 (two) times daily.  [provider] Taking Active Self  potassium chloride SA (KLOR-CON) 20 MEQ tablet [id number] Yes TAKE 2 TABLETS TWICE A DAY Sherran Needs, NP Taking Active   pravastatin (PRAVACHOL) 20 MG tablet [id number] Yes TAKE 1 TABLET EVERY DAY Burns, Claudina Lick, MD Taking Active   sildenafil (VIAGRA) 100 MG tablet [id number] Yes Take 100 mg by  mouth daily as needed for erectile dysfunction. 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   Acute cystitis without hematuria 05/31/2018   OA (osteoarthritis) of hip 05/17/2018   Pre-op examination 05/11/2018   Hives 10/02/2017   Prediabetes 09/05/2015   Oral herpes simplex infection 09/05/2015   Arthritis of right hip 04/02/2015   Atrial fibrillation ([place]) 08/24/2014   Obstructive sleep apnea 07/28/2012   Nonspecific abnormal electrocardiogram (ECG) (EKG) 05/15/2011   Hyperlipidemia 10/10/2008   Essential hypertension 10/10/2008   History of colonic polyps 10/10/2008   NODULAR 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 OBST 02/02/2007    Immunization History  Administered Date(s) Administered   Fluad Quad(high Dose 65+) 01/22/2021   Influenza Split 03/18/2011   Influenza Whole 01/14/2010   Influenza, High Dose Seasonal PF 02/15/2013, 01/26/2014, 02/16/2015, 01/16/2016, 01/01/2017, 01/03/2019, 12/30/2019   Influenza,inj,quad, With Preservative 01/13/2018   PFIZER(Purple Top)SARS-COV-2 Vaccination 05/27/2019, 06/22/2019, 01/25/2020   Pfizer Covid-19 Vaccine Bivalent Booster 63yr & up 01/22/2021   Pneumococcal Conjugate-13 08/24/2014   Pneumococcal Polysaccharide-23 02/04/2010   Td 11/08/2009   Tdap 02/25/2019   Zoster Recombinat (Shingrix) 01/13/2018, 03/25/2018    Conditions to be addressed/monitored:  Hypertension, Hyperlipidemia and Atrial Fibrillation  Care Plan : CCM Pharmacy Care Plan  Updates made by STomasa Blase RPH since 05/02/2021 12:00 AM     Problem: Hypertension, Hyperlipidemia and Atrial Fibrillation   Priority: High     Long-Range Goal: Disease management   Start Date: 09/04/2020  Expected End Date: 09/07/2021  This Visit's Progress: On track  Recent Progress: On  track  Priority: High  Note:   Current Barriers:  Unable to independently monitor therapeutic efficacy  Pharmacist Clinical Goal(s):  Patient will achieve adherence to monitoring guidelines and medication adherence to achieve therapeutic efficacy through collaboration with PharmD and provider.   Interventions: 1:1 collaboration with BBinnie Rail MD regarding development and update of comprehensive plan of care as evidenced by provider attestation and co-signature Inter-disciplinary care team collaboration (see longitudinal plan of care) Comprehensive medication review performed; medication list updated in electronic medical record  Hypertension (BP goal <140/90) -Controlled - improved since increase in amlodipine - BP is at goal at home; pt reports compliance and denies side effects -Current treatment: Amlodipine 157m- 1 tablet daily  Metoprolol tartrate 25 mg - 1/2 tab BID -Current home readings: 130-140/80s with HR in the 60's  BP Readings from Last 3 Encounters:  04/08/21 (!) 150/82  04/02/21 131/68  02/19/21 (!) 142/70  -Current exercise habits: yard work, walking -Denies hypotensive/hypertensive symptoms -Educated on BP goals and benefits of medications for prevention of heart attack, stroke and kidney damage; Daily salt intake goal < 2300 mg; Exercise goal of 150 minutes per week; -Counseled to monitor BP at home daily, document, and provide log at future appointments -Recommended to continue current medication  Hyperlipidemia: (LDL goal < 100) -Controlled - LDL is at goal; pt endorses compliance and denies side effects Lab Results  Component Value Date   LDKnightsen6 01/11/2020  -Current treatment: Pravastatin 20 mg daily -Educated on Cholesterol goals;  Benefits of statin for ASCVD risk reduction; -Recommended to continue current medication  Atrial Fibrillation (Goal: prevent stroke and major bleeding) -Controlled - pt reports minimal Afib symptoms; he reports  compliance with meds; denies bleeding issues with Eliquis -CHADSVASC: 3 -Current treatment: Rate control: Metoprolol tartrate 25 mg - 1/2 tab BID, dofetilide 500 mg BID  Anticoagulation: Eliquis 5 mg BID -Counseled on increased risk of stroke due to Afib and benefits of anticoagulation for stroke prevention; importance of adherence to anticoagulant exactly as prescribed; bleeding risk associated with Eliquis and importance of self-monitoring for signs/symptoms of bleeding; avoidance of NSAIDs due to increased bleeding risk with anticoagulants; -Counseled on importance of not missing doses of dofetilide; >3 missed doses requires hospitalization to restart the drug -Recommended to continue current medication   Oral herpes simplex (Goal: prevent flares) -Controlled - pt has been on suppressive therapy for many years and reports cold sore breakthrough is rare -Current treatment  Acyclovir 200 mg - 2 cap daily -Recommended to continue current medication  Health Maintenance -Vaccine gaps: none -Current therapy:  Docusate 100 mg BID Glucosamine Vitamin C Vitamin D 2000 IU Multivitamin Preservision Areds 2 Klor-Con 20 mEq QID Tylenol 500 mg -Educated on Herbal supplement research is limited and benefits usually cannot be proven -Patient is satisfied with current therapy and denies issues -Recommended to continue current medication  Patient Goals/Self-Care Activities Patient will:  - take medications as prescribed focus on medication adherence by pill box check blood pressure daily, document, and provide at future appointments collaborate with provider on medication access solutions  Follow Up Plan: Telephone follow up appointment with care management team member scheduled for: 6 months     Medication Assistance: None required.  Patient affirms current coverage meets needs.  Patient's preferred pharmacy is:  [facility] [address] [place] - [address] [place] [place] [address] Phone: [phone] Fax: [phone]  [place], [place] [address] Phone: [phone] Fax: [phone]  Uses pill box? Yes Pt endorses 100% compliance  We discussed: Current pharmacy is preferred with insurance plan and patient is satisfied with pharmacy services Patient decided to: Continue current medication management strategy  Care Plan and Follow Up Patient Decision:  Patient agrees to Care Plan and Follow-up.  Plan: Telephone follow up appointment with care management team member scheduled for:  6 months  Tomasa Blase, PharmD Clinical Pharmacist, [place]

## 2021-05-02 NOTE — Patient Instructions (Signed)
Visit Information  Following are the goals we discussed today:   Manage My Medicine   Timeframe:  Long-Range Goal Priority:  Medium Start Date:      09/04/20                       Expected End Date:  05/02/2022                    Follow Up Date 10/30/2021   - call for medicine refill 2 or 3 days before it runs out - call if I am sick and can't take my medicine - keep a list of all the medicines I take; vitamins and herbals too - use a pillbox to sort medicine    Why is this important?   These steps will help you keep on track with your medicines.  Plan: Telephone follow up appointment with care management team member scheduled for:  6 months  The patient has been provided with contact information for the care management team and has been advised to call with any health related questions or concerns.   Tomasa Blase, PharmD Clinical Pharmacist, [facility]   Please call the care guide team at [phone] if you need to cancel or reschedule your appointment.   Patient verbalizes understanding of instructions and care plan provided today and agrees to view in [place]. Active MyChart status confirmed with patient.

## 2021-05-16 ENCOUNTER — Encounter: Payer: Medicare Other | Admitting: Gastroenterology

## 2021-05-21 ENCOUNTER — Encounter: Payer: Medicare Other | Admitting: Gastroenterology

## 2021-05-21 DIAGNOSIS — I10 Essential (primary) hypertension: Secondary | ICD-10-CM | POA: Diagnosis not present

## 2021-05-21 DIAGNOSIS — I4891 Unspecified atrial fibrillation: Secondary | ICD-10-CM

## 2021-05-21 DIAGNOSIS — E782 Mixed hyperlipidemia: Secondary | ICD-10-CM | POA: Diagnosis not present

## 2021-05-22 ENCOUNTER — Other Ambulatory Visit: Payer: Self-pay | Admitting: Internal Medicine

## 2021-05-22 ENCOUNTER — Other Ambulatory Visit (HOSPITAL_COMMUNITY): Payer: Self-pay | Admitting: Nurse Practitioner

## 2021-05-22 DIAGNOSIS — I480 Paroxysmal atrial fibrillation: Secondary | ICD-10-CM

## 2021-05-23 ENCOUNTER — Ambulatory Visit (INDEPENDENT_AMBULATORY_CARE_PROVIDER_SITE_OTHER): Payer: Medicare Other | Admitting: Gastroenterology

## 2021-05-23 ENCOUNTER — Encounter: Payer: Self-pay | Admitting: Gastroenterology

## 2021-05-23 VITALS — BP 128/72 | HR 65 | Ht 67.0 in | Wt 184.0 lb

## 2021-05-23 DIAGNOSIS — K640 First degree hemorrhoids: Secondary | ICD-10-CM

## 2021-05-23 DIAGNOSIS — K648 Other hemorrhoids: Secondary | ICD-10-CM

## 2021-05-23 NOTE — Patient Instructions (Signed)
HEMORRHOID BANDING PROCEDURE    FOLLOW-UP CARE   The procedure you have had should have been relatively painless since the banding of the area involved does not have nerve endings and there is no pain sensation.  The rubber band cuts off the blood supply to the hemorrhoid and the band may fall off as soon as 48 hours after the banding (the band may occasionally be seen in the toilet bowl following a bowel movement). You may notice a temporary feeling of fullness in the rectum which should respond adequately to plain Tylenol or Motrin.  Following the banding, avoid strenuous exercise that evening and resume full activity the next day.  A sitz bath (soaking in a warm tub) or bidet is soothing, and can be useful for cleansing the area after bowel movements.     To avoid constipation, take two tablespoons of natural wheat bran, natural oat bran, flax, Benefiber or any over the counter fiber supplement and increase your water intake to 7-8 glasses daily.    Unless you have been prescribed anorectal medication, do not put anything inside your rectum for two weeks: No suppositories, enemas, fingers, etc.  Occasionally, you may have more bleeding than usual after the banding procedure.  This is often from the untreated hemorrhoids rather than the treated one.  Don't be concerned if there is a tablespoon or so of blood.  If there is more blood than this, lie flat with your bottom higher than your head and apply an ice pack to the area. If the bleeding does not stop within a half an hour or if you feel faint, call our office at [phone] or go to the emergency room.  Problems are not common; however, if there is a substantial amount of bleeding, severe pain, chills, fever or difficulty passing urine (very rare) or other problems, you should call us at [phone] or report to the nearest emergency room.  Do not stay seated continuously for more than 2-3 hours for a day or two after the procedure.   Tighten your buttock muscles 10-15 times every two hours and take 10-15 deep breaths every 1-2 hours.  Do not spend more than a few minutes on the toilet if you cannot empty your bowel; instead re-visit the toilet at a later time.   Thank you for choosing me and [facility].  Malcolm T. Stark, Jr., MD., FACG  

## 2021-05-23 NOTE — Progress Notes (Signed)
PROCEDURE NOTE: The patient presents with symptomatic bleeding grade 1 hemorrhoids, requesting rubber band ligation of their hemorrhoidal disease.  All risks, benefits and alternative forms of therapy were described and informed consent was obtained. Prior hemorrhoid banding in 2019.   He is maintained on Eliquis and after discussion of the risks, benefits and alternatives he consents to remaining on Eliquis through the banding procedures.  He understands the higher potential risk of bleeding complications.  The anorectum was pre-medicated during digital exam with 0.125% NTG and 5% lidocaine. In the Left Lateral Decubitus position anoscopic examination revealed grade I hemorrhoids in the RA > RP > LL positions.   The decision was made to band the RA internal hemorrhoid, and the [place] was used to perform band ligation without complication.  Digital anorectal examination was then performed to assure proper positioning of the band and to adjust the banded tissue as required. No adjustment was needed.  No complications were encountered and the patient tolerated the procedure well.  Dietary and behavioral recommendations were given and along with follow-up instructions.   Discontinue Desitin cream and may resume prn Return for possible additional hemorrhoid banding in 2 - 3 weeks as required.  High fiber diet, Benefiber daily and at least 8 glasses of water daily. The patient was discharged home without pain or other post procedure problems.

## 2021-07-02 ENCOUNTER — Ambulatory Visit (INDEPENDENT_AMBULATORY_CARE_PROVIDER_SITE_OTHER): Payer: Medicare Other | Admitting: Gastroenterology

## 2021-07-02 ENCOUNTER — Encounter: Payer: Self-pay | Admitting: Gastroenterology

## 2021-07-02 VITALS — BP 140/80 | HR 64 | Ht 67.0 in | Wt 187.4 lb

## 2021-07-02 DIAGNOSIS — K640 First degree hemorrhoids: Secondary | ICD-10-CM

## 2021-07-02 DIAGNOSIS — K648 Other hemorrhoids: Secondary | ICD-10-CM

## 2021-07-02 NOTE — Patient Instructions (Signed)
HEMORRHOID BANDING PROCEDURE    FOLLOW-UP CARE   The procedure you have had should have been relatively painless since the banding of the area involved does not have nerve endings and there is no pain sensation.  The rubber band cuts off the blood supply to the hemorrhoid and the band may fall off as soon as 48 hours after the banding (the band may occasionally be seen in the toilet bowl following a bowel movement). You may notice a temporary feeling of fullness in the rectum which should respond adequately to plain Tylenol or Motrin.  Following the banding, avoid strenuous exercise that evening and resume full activity the next day.  A sitz bath (soaking in a warm tub) or bidet is soothing, and can be useful for cleansing the area after bowel movements.     To avoid constipation, take two tablespoons of natural wheat bran, natural oat bran, flax, Benefiber or any over the counter fiber supplement and increase your water intake to 7-8 glasses daily.    Unless you have been prescribed anorectal medication, do not put anything inside your rectum for two weeks: No suppositories, enemas, fingers, etc.  Occasionally, you may have more bleeding than usual after the banding procedure.  This is often from the untreated hemorrhoids rather than the treated one.  Don't be concerned if there is a tablespoon or so of blood.  If there is more blood than this, lie flat with your bottom higher than your head and apply an ice pack to the area. If the bleeding does not stop within a half an hour or if you feel faint, call our office at [phone] or go to the emergency room.  Problems are not common; however, if there is a substantial amount of bleeding, severe pain, chills, fever or difficulty passing urine (very rare) or other problems, you should call us at [phone] or report to the nearest emergency room.  Do not stay seated continuously for more than 2-3 hours for a day or two after the procedure.   Tighten your buttock muscles 10-15 times every two hours and take 10-15 deep breaths every 1-2 hours.  Do not spend more than a few minutes on the toilet if you cannot empty your bowel; instead re-visit the toilet at a later time.   Thank you for choosing me and [facility].  Malcolm T. Stark, Jr., MD., FACG  

## 2021-07-02 NOTE — Progress Notes (Signed)
PROCEDURE NOTE: ?The patient presents with symptomatic bleeding grade I hemorrhoids, requesting rubber band ligation of their hemorrhoidal disease.  All risks, benefits and alternative forms of therapy were described and informed consent was obtained. RA hemorrhoid banded on 05/23/2021 and he reports no bleeding since then.  ? ?He is maintained on Eliquis and after discussion of the risks, benefits and alternatives he consents to remaining on Eliquis through the banding procedures.  He understands the higher potential risk of bleeding complications. ? ?The anorectum was pre-medicated during digital exam with 0.125% NTG and 5% lidocaine. ? ?The decision was made to band the RP internal hemorrhoid, and the Mitchell O?Regan System was used to perform band ligation without complication.  ?Digital anorectal examination was then performed to assure proper positioning of the band and to adjust the banded tissue as required. No adjustment was needed.  ?No complications were encountered and the patient tolerated the procedure well. ? ?Dietary and behavioral recommendations were given and along with follow-up instructions.   ?Return for possible additional hemorrhoid banding in 2 - 3 weeks as required.  ?High fiber diet, Benefiber daily and at least 8 glasses of water daily. ?The patient was discharged home without pain or other post procedure problems.   ?

## 2021-07-18 ENCOUNTER — Encounter: Payer: Self-pay | Admitting: Gastroenterology

## 2021-07-18 ENCOUNTER — Ambulatory Visit (INDEPENDENT_AMBULATORY_CARE_PROVIDER_SITE_OTHER): Payer: Medicare Other | Admitting: Gastroenterology

## 2021-07-18 VITALS — BP 124/68 | HR 60 | Ht 67.0 in | Wt 181.0 lb

## 2021-07-18 DIAGNOSIS — K648 Other hemorrhoids: Secondary | ICD-10-CM | POA: Diagnosis not present

## 2021-07-18 DIAGNOSIS — K640 First degree hemorrhoids: Secondary | ICD-10-CM

## 2021-07-18 NOTE — Progress Notes (Signed)
PROCEDURE NOTE: ?The patient presents with symptomatic grade I hemorrhoids, requesting rubber band ligation of their hemorrhoidal disease.  All risks, benefits and alternative forms of therapy were described and informed consent was obtained. RP hemorrhoid banded on 07/02/2021 and 1 episode of minor rectal bleeding 2 days after banding and none since.    ?  ?He is maintained on Eliquis and after discussion of the risks, benefits and alternatives he consents to remaining on Eliquis through the banding procedures.  He understands the higher potential risk of bleeding complications. ? ?The decision was made to band the LL internal hemorrhoid, and the [place] O?Regan System was used to perform band ligation without complication.  ?Digital anorectal examination was then performed to assure proper positioning of the band and to adjust the banded tissue as required. No adjustment was needed.  ?No complications were encountered and the patient tolerated the procedure well. ? ?Dietary and behavioral recommendations were given and along with follow-up instructions.   ?High fiber diet, Benefiber daily and at least 8 glasses of water daily. ?The patient was discharged home without pain or other post procedure problems.   ?

## 2021-07-18 NOTE — Patient Instructions (Signed)
HEMORRHOID BANDING PROCEDURE    FOLLOW-UP CARE   The procedure you have had should have been relatively painless since the banding of the area involved does not have nerve endings and there is no pain sensation.  The rubber band cuts off the blood supply to the hemorrhoid and the band may fall off as soon as 48 hours after the banding (the band may occasionally be seen in the toilet bowl following a bowel movement). You may notice a temporary feeling of fullness in the rectum which should respond adequately to plain Tylenol or Motrin.  Following the banding, avoid strenuous exercise that evening and resume full activity the next day.  A sitz bath (soaking in a warm tub) or bidet is soothing, and can be useful for cleansing the area after bowel movements.     To avoid constipation, take two tablespoons of natural wheat bran, natural oat bran, flax, Benefiber or any over the counter fiber supplement and increase your water intake to 7-8 glasses daily.    Unless you have been prescribed anorectal medication, do not put anything inside your rectum for two weeks: No suppositories, enemas, fingers, etc.  Occasionally, you may have more bleeding than usual after the banding procedure.  This is often from the untreated hemorrhoids rather than the treated one.  Don't be concerned if there is a tablespoon or so of blood.  If there is more blood than this, lie flat with your bottom higher than your head and apply an ice pack to the area. If the bleeding does not stop within a half an hour or if you feel faint, call our office at [phone] or go to the emergency room.  Problems are not common; however, if there is a substantial amount of bleeding, severe pain, chills, fever or difficulty passing urine (very rare) or other problems, you should call us at [phone] or report to the nearest emergency room.  Do not stay seated continuously for more than 2-3 hours for a day or two after the procedure.   Tighten your buttock muscles 10-15 times every two hours and take 10-15 deep breaths every 1-2 hours.  Do not spend more than a few minutes on the toilet if you cannot empty your bowel; instead re-visit the toilet at a later time.   Thank you for choosing me and [facility].  Malcolm T. Stark, Jr., MD., FACG  

## 2021-08-15 DIAGNOSIS — H2513 Age-related nuclear cataract, bilateral: Secondary | ICD-10-CM | POA: Diagnosis not present

## 2021-08-21 DIAGNOSIS — Z23 Encounter for immunization: Secondary | ICD-10-CM | POA: Diagnosis not present

## 2021-09-05 ENCOUNTER — Other Ambulatory Visit (HOSPITAL_COMMUNITY): Payer: Self-pay

## 2021-09-05 MED ORDER — APIXABAN 5 MG PO TABS: 5.0000 mg | ORAL_TABLET | Freq: Two times a day (BID) | ORAL | 3 refills | Status: DC

## 2021-10-09 ENCOUNTER — Encounter (HOSPITAL_COMMUNITY): Payer: Self-pay | Admitting: Nurse Practitioner

## 2021-10-09 ENCOUNTER — Ambulatory Visit (HOSPITAL_COMMUNITY)
Admission: RE | Admit: 2021-10-09 | Discharge: 2021-10-09 | Disposition: A | Discharge status: Home / Self Care | Payer: Medicare Other | Source: Ambulatory Visit | Attending: Nurse Practitioner | Admitting: Nurse Practitioner

## 2021-10-09 VITALS — BP 148/74 | HR 57 | Ht 67.0 in | Wt 178.4 lb

## 2021-10-09 DIAGNOSIS — G4733 Obstructive sleep apnea (adult) (pediatric): Secondary | ICD-10-CM | POA: Insufficient documentation

## 2021-10-09 DIAGNOSIS — I10 Essential (primary) hypertension: Secondary | ICD-10-CM | POA: Diagnosis not present

## 2021-10-09 DIAGNOSIS — I480 Paroxysmal atrial fibrillation: Secondary | ICD-10-CM

## 2021-10-09 DIAGNOSIS — Z79899 Other long term (current) drug therapy: Secondary | ICD-10-CM | POA: Insufficient documentation

## 2021-10-09 DIAGNOSIS — D6869 Other thrombophilia: Secondary | ICD-10-CM | POA: Diagnosis not present

## 2021-10-09 DIAGNOSIS — I4891 Unspecified atrial fibrillation: Secondary | ICD-10-CM | POA: Diagnosis not present

## 2021-10-09 LAB — BASIC METABOLIC PANEL
Anion gap: 8 (ref 5–15)
BUN: 21 mg/dL (ref 8–23)
CO2: 27 mmol/L (ref 22–32)
Calcium: 9.6 mg/dL (ref 8.9–10.3)
Chloride: 103 mmol/L (ref 98–111)
Creatinine, Ser: 1.19 mg/dL (ref 0.61–1.24)
GFR, Estimated: >60 mL/min (ref >60)
Glucose, Bld: 123 mg/dL — ABNORMAL HIGH (ref 70–99)
Potassium: 4.4 mmol/L (ref 3.5–5.1)
Sodium: 138 mmol/L (ref 135–145)

## 2021-10-09 LAB — MAGNESIUM: Magnesium: 2.2 mg/dL (ref 1.7–2.4)

## 2021-10-09 NOTE — Progress Notes (Signed)
Patient ID: Daniel Tucker, male   DOB: Aug 23, 1940, 81 y.o.   MRN: [id number]     Primary Care Physician: Binnie Rail, MD Referring Physician: Dr. Jovita Kussmaul is a 81 y.o. male with a h/o paroxysmal afib maintaining SR on tikosyn. He is in the afib clinic for f/u surveillance of Tikosyn. He did go thru a hip replacement in February 2020 without any issues. Short episode  afib lst week that responded well to extra dose of BB. Pending 2nd covid shot 3/3. Qtc is stable.   F/u in the afib clinic, 8/26, for surveillance ofTikosyn. He forgot his Tikosyn a couple of weeks ago and did have breakthrough afib.He did convert the next day. No further  afib.   F/u in afib clinic 10/03/20. He states that he has been staying in [place]. Rare episode of afib that resolves with extra metoprolol. He has lost some weight as he got a new puppy and is walking outside with it frequently. No issues with anticoagulation.   F/u in afib clinic, 12/19, no afib to report. His BP's at home are typically running 175-102 systolic. BP is  150/82 bpm . Discussed increasing amlodipine to 10 mg daily to better control BP. He is compliant with meds.   F/u afib clinic, 10/09/21 for tikosyn surveillance. He is in SR, one episode of  afib to report. Took extra BB and was back in SR in 1-2 hours. Otherwise, no concerns. Qt stable with dofetilide.   Today, he denies symptoms of palpitations, chest pain, shortness of breath, orthopnea, PND, lower extremity edema, dizziness, presyncope, syncope, or neurologic sequela. The patient is tolerating medications without difficulties and is otherwise without complaint today.   Past Medical History:  Diagnosis Date   Arthritis    "hips" (11/20/2014)   Benign essential HTN    Fasting hyperglycemia 2006   FBS 111; normal A1c   Hepatitis ~ 1955   "yellow juandice"   Hyperlipidemia    Internal hemorrhoids    Persistent atrial fibrillation ([place]) 08/24/2014   CHASD2VASC score is 3  - on Apixaban   Sleep apnea    intolerant to CPAP and now using oral device followed by Dr. 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      acyclovir (ZOVIRAX) 200 MG capsule TAKE 2 CAPSULES EVERY DAY 180 capsule 1   amLODipine (NORVASC) 10 MG tablet Take 1 tablet (10 mg total) by mouth daily. 90 tablet 3   apixaban (ELIQUIS) 5 MG TABS tablet Take 1 tablet (5 mg  total) by mouth 2 (two) times daily. 180 tablet 3   Ascorbic Acid (VITAMIN C) 1000 MG tablet Take 1,000 mg by mouth daily.     Cholecalciferol (VITAMIN D) 50 MCG (2000 UT) tablet Take 2,000 Units by mouth daily.     docusate sodium (COLACE) 100 MG capsule Take 100 mg by mouth 2 (two) times daily.      dofetilide (TIKOSYN) 500 MCG capsule TAKE 1 CAPSULE TWICE DAILY 180 capsule 2   GLUCOSAMINE SULFATE PO Take 2 tablets by mouth daily.      metoprolol tartrate (LOPRESSOR) 25 MG tablet TAKE 1/2 TABLET TWICE DAILY 90 tablet 1   Multiple Vitamin (MULTIVITAMIN) tablet Take 1 tablet by mouth daily.     Multiple Vitamins-Minerals (PRESERVISION AREDS 2 PO) Take 1  capsule by mouth 2 (two) times daily.      potassium chloride SA (KLOR-CON) 20 MEQ tablet TAKE 2 TABLETS TWICE A DAY 360 tablet 3   pravastatin (PRAVACHOL) 20 MG tablet TAKE 1 TABLET EVERY DAY 90 tablet 1   sildenafil (VIAGRA) 100 MG tablet Take 100 mg by mouth daily as needed for erectile dysfunction.     No current facility-administered medications for this encounter.    No Known Allergies  Social History   Socioeconomic History   Marital status: Married    Spouse name: Not on file   Number of children: 3   Years of education: Not on file   Highest education level: Not on file  Occupational History   Occupation: retired   Tobacco Use   Smoking status: Former    Packs/day: 1.00    Years: 7.00    Total pack years: 7.00    Types: Cigarettes    Quit date: 1968    Years since quitting: 55.5   Smokeless tobacco: Never   Tobacco comments:    smoked cigarettes age [age], up to 1ppd  Vaping Use   Vaping Use: Never used  Substance and Sexual Activity   Alcohol use: Yes    Alcohol/week: 2.0 standard drinks of alcohol    Types: 1 Glasses of wine, 1 Cans of beer per week    Comment: less than 1 drink per week   Drug use: No   Sexual activity: Yes  Other Topics Concern   Not on file  Social History Narrative   Not on file   Social Determinants of Health   Financial Resource Strain: Low Risk  (03/25/2021)   Overall Financial Resource Strain (CARDIA)    Difficulty of Paying Living Expenses: Not hard at all  Food Insecurity: No Food Insecurity (03/25/2021)   Hunger Vital Sign    Worried About Running Out of Food in the Last Year: Never true    Ran Out of Food in the Last Year: Never true  Transportation Needs: No Transportation Needs (03/25/2021)   PRAPARE - Hydrologist (Medical): No    Lack of Transportation (Non-Medical): No  Physical Activity: Sufficiently Active (03/25/2021)   Exercise Vital Sign    Days of Exercise per Week: 5 days     Minutes of Exercise per Session: 30 min  Stress: No Stress Concern Present (03/25/2021)   [place]    Feeling of Stress : Not at all  Social Connections: [place] (03/25/2021)   Social Connection and Isolation Panel [NHANES]    Frequency of Communication with Friends and Family: More than three times a week  Frequency of Social Gatherings with Friends and Family: More than three times a week    Attends Religious Services: More than 4 times per year    Active Member of [employer] or Organizations: Yes    Attends Music therapist: More than 4 times per year    Marital Status: Married  Human resources officer Violence: Not At Risk (03/25/2021)   Humiliation, Afraid, Rape, and Kick questionnaire    Fear of Current or Ex-Partner: No    Emotionally Abused: No    Physically Abused: No    Sexually Abused: No    Family History  Problem Relation Age of Onset   Hypertension Mother    Congestive Heart Failure Mother    Prostate cancer Father    Hypertension Father    Melanoma Sister    Lupus Sister    Prostate cancer Brother        mets to liver   Liver cancer Brother    Atrial fibrillation Brother    Melanoma Brother    Diabetes Neg Hx    Heart disease Neg Hx    Stroke Neg Hx    Colon cancer Neg Hx    Stomach cancer Neg Hx     ROS- All systems are reviewed and negative except as per the HPI above  Physical Exam: Vitals:   10/09/21 1050  BP: (!) 148/74  Pulse: (!) 57  Weight: 80.9 kg  Height: _0  (1.702 m)    GEN- The patient is well appearing, alert and oriented x 3 today.   Head- normocephalic, atraumatic Eyes-  Sclera clear, conjunctiva pink Ears- hearing intact Oropharynx- clear Neck- supple, no JVP Lymph- no cervical lymphadenopathy Lungs- Clear to ausculation bilaterally, normal work of breathing Heart- regular rate and rhythm  no murmurs, rubs or gallops, PMI not laterally  displaced GI- soft, NT, ND, + BS Extremities- no clubbing, cyanosis, or edema MS- no significant deformity or atrophy Skin- no rash or lesion Psych- euthymic mood, full affect Neuro- strength and sensation are intact  EKG-Vent. rate 57 BPM PR interval 150 ms QRS duration 136 ms QT/QTcB 454/441 ms P-R-T axes 65 -42 -12 Sinus bradycardia with sinus arrhythmia Left axis deviation Right bundle branch block Minimal voltage criteria for LVH, may be normal variant ( R in aVL ) Abnormal ECG When compared with ECG of 08-Apr-2021 10:01, PREVIOUS ECG IS PRESENT  Epic records reviewed.  Assessment and Plan: 1. Afib  Low afib burden Continue on tikosyn 500 mg bid and metoprolol 12.5  mg bid  qtc stable Continue apixaban 5 mg bid, no bleeding issues Bmet/mag today   2.OSA Still wearing mouthpiece  3. HTN  Stable   4. Asymptomatic brady HR stable's in the 50's    F/u in 6 months    Butch Penny C. Catherene Kaleta, [facility] [address] [place], [place] [address] [phone]

## 2021-10-24 ENCOUNTER — Other Ambulatory Visit (HOSPITAL_COMMUNITY): Payer: Self-pay | Admitting: Nurse Practitioner

## 2021-12-18 DIAGNOSIS — Z23 Encounter for immunization: Secondary | ICD-10-CM | POA: Diagnosis not present
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
## 2021-12-24 ENCOUNTER — Ambulatory Visit (INDEPENDENT_AMBULATORY_CARE_PROVIDER_SITE_OTHER): Payer: Medicare Other | Admitting: Internal Medicine

## 2021-12-24 ENCOUNTER — Encounter: Payer: Self-pay | Admitting: Internal Medicine

## 2021-12-24 VITALS — BP 128/70 | HR 60 | Temp 97.9°F | Wt 177.0 lb

## 2021-12-24 DIAGNOSIS — G479 Sleep disorder, unspecified: Secondary | ICD-10-CM

## 2021-12-24 DIAGNOSIS — G4730 Sleep apnea, unspecified: Secondary | ICD-10-CM | POA: Insufficient documentation

## 2021-12-24 NOTE — Assessment & Plan Note (Addendum)
Has had mild RLS for years with leg movement and kicking that has been mild This past several months his kicking has gotten worse and he has started punching - worse in the past couple of weeks ? REM sleep d/o Will refer to neuro - Dr Brett Fairy for further evaluation Trial of melatonin 2 mg HS Can consider clonazepam at night but ideally would like Dr Brett Fairy to see him first - they will let me know if his symptoms are worsening or not tolerable.

## 2021-12-24 NOTE — Patient Instructions (Addendum)
     Medications changes include :   try melatonin 2 mg at night     A referral was ordered for [facility] - Dr Brett Fairy      Someone from that office will call you to schedule an appointment.     If your symptoms worsen prior to seeing neurology please let me know - we can consider trying clonazepam.

## 2022-01-08 ENCOUNTER — Other Ambulatory Visit (HOSPITAL_COMMUNITY): Payer: Self-pay | Admitting: Nurse Practitioner

## 2022-01-08 ENCOUNTER — Other Ambulatory Visit: Payer: Self-pay | Admitting: Internal Medicine

## 2022-01-08 DIAGNOSIS — I480 Paroxysmal atrial fibrillation: Secondary | ICD-10-CM

## 2022-01-09 DIAGNOSIS — Z23 Encounter for immunization: Secondary | ICD-10-CM | POA: Diagnosis not present

## 2022-01-28 ENCOUNTER — Ambulatory Visit (INDEPENDENT_AMBULATORY_CARE_PROVIDER_SITE_OTHER): Payer: Medicare Other | Admitting: Gastroenterology

## 2022-01-28 ENCOUNTER — Encounter: Payer: Self-pay | Admitting: Gastroenterology

## 2022-01-28 VITALS — BP 136/68 | HR 74 | Ht 67.0 in | Wt 179.0 lb

## 2022-01-28 DIAGNOSIS — K625 Hemorrhage of anus and rectum: Secondary | ICD-10-CM | POA: Insufficient documentation

## 2022-01-28 DIAGNOSIS — K648 Other hemorrhoids: Secondary | ICD-10-CM

## 2022-01-28 MED ORDER — HYDROCORTISONE (PERIANAL) 2.5 % EX CREA: 1.0000 | TOPICAL_CREAM | Freq: Two times a day (BID) | CUTANEOUS | 1 refills | Status: DC

## 2022-01-28 NOTE — Progress Notes (Signed)
01/28/2022 Daniel Tucker [id number] 1941/03/24   HISTORY OF PRESENT ILLNESS: This is a pleasant 81 year old male who is a patient of Dr. Lynne Leader.  He presents here today with complaints of rectal bleeding.  He has been seeing small amounts of bright red blood periodically since September 10, occurs only with/after bowel movements.  Had hemorrhoid bandings earlier this year.  Colonoscopy December 2022 with only diverticulosis and internal hemorrhoids.  Says that that the bleeding is much less than it was before his other bandings.  No rectal pain or other complaints.  Says that it is more just a nuisance.  He is on Eliquis.   Past Medical History:  Diagnosis Date   Arthritis    "hips" (11/20/2014)   Benign essential HTN    Fasting hyperglycemia 2006   FBS 111; normal A1c   Hepatitis ~ 1955   "yellow juandice"   Hyperlipidemia    Internal hemorrhoids    Persistent atrial fibrillation ([place]) 08/24/2014   CHASD2VASC score is 3 - on Apixaban   Sleep apnea    intolerant to CPAP and now using oral device followed by Dr. Toy Cookey   Tubular adenoma of colon 2006   Dr Fuller Plan   Past Surgical History:  Procedure Laterality Date   CARDIOVERSION N/A 10/12/2014   Procedure: CARDIOVERSION;  Surgeon: Darlin Coco, MD;  Location: [place];  Service: Cardiovascular;  Laterality: N/A;   COLONOSCOPY  2011   no polyp   COLONOSCOPY W/ POLYPECTOMY  2006   [place]   NASAL SINUS SURGERY  1997   PROSTATE BIOPSY  2010   Negative,Dr.Ottelin   TONSILLECTOMY AND ADENOIDECTOMY  1997   TOTAL HIP ARTHROPLASTY Left 08/2010   Dr Maureen Ralphs   TOTAL HIP ARTHROPLASTY Right 05/17/2018   Procedure: TOTAL HIP ARTHROPLASTY ANTERIOR APPROACH;  Surgeon: Gaynelle Arabian, MD;  Location: [facility];  Service: Orthopedics;  Laterality: Right;   UVULOPALATOPHARYNGOPLASTY  1997   Dr.Woliki    reports that he quit smoking about 55 years ago. His smoking use included cigarettes. He has a 7.00 pack-year smoking history. He  has never used smokeless tobacco. He reports current alcohol use of about 2.0 standard drinks of alcohol per week. He reports that he does not use drugs. family history includes Atrial fibrillation in his brother; Congestive Heart Failure in his mother; Hypertension in his father and mother; Liver cancer in his brother; Lupus in his sister; Melanoma in his brother and sister; Prostate cancer in his brother and father. No Known Allergies    Outpatient Encounter Medications as of 01/28/2022  Medication Sig   acetaminophen (TYLENOL) 500 MG tablet Take 500 mg by mouth every 6 (six) hours as needed for moderate pain or headache.    acyclovir (ZOVIRAX) 200 MG capsule TAKE 2 CAPSULES EVERY DAY   amLODipine (NORVASC) 10 MG tablet Take 1 tablet (10 mg total) by mouth daily.   apixaban (ELIQUIS) 5 MG TABS tablet Take 1 tablet (5 mg total) by mouth 2 (two) times daily.   Ascorbic Acid (VITAMIN C) 1000 MG tablet Take 1,000 mg by mouth daily.   docusate sodium (COLACE) 100 MG capsule Take 100 mg by mouth 2 (two) times daily.    dofetilide (TIKOSYN) 500 MCG capsule TAKE 1 CAPSULE TWICE DAILY   GLUCOSAMINE SULFATE PO Take 2 tablets by mouth daily.    metoprolol tartrate (LOPRESSOR) 25 MG tablet TAKE 1/2 TABLET TWICE DAILY   Multiple Vitamin (MULTIVITAMIN) tablet Take 1 tablet by mouth daily.   Multiple  Vitamins-Minerals (PRESERVISION AREDS 2 PO) Take 1 capsule by mouth 2 (two) times daily.    potassium chloride SA (KLOR-CON) 20 MEQ tablet TAKE 2 TABLETS TWICE A DAY   pravastatin (PRAVACHOL) 20 MG tablet TAKE 1 TABLET EVERY DAY   sildenafil (VIAGRA) 100 MG tablet Take 100 mg by mouth daily as needed for erectile dysfunction.   No facility-administered encounter medications on file as of 01/28/2022.     REVIEW OF SYSTEMS  : All other systems reviewed and negative except where noted in the History of Present Illness.   PHYSICAL EXAM: BP 136/68   Pulse 74   Ht '5\' 7"'$  (1.702 m)   Wt 179 lb (81.2 kg)    BMI 28.04 kg/m  General: Well developed white male in no acute distress Head: Normocephalic and atraumatic Eyes:  Sclerae anicteric, conjunctiva pink. Ears: Normal auditory acuity Lungs: Clear throughout to auscultation; no W/R/R. Heart: Regular rate and rhythm; no M/R/G. Musculoskeletal: Symmetrical with no gross deformities  Skin: No lesions on visible extremities Extremities: No edema  Neurological: Alert oriented x 4, grossly non-focal Psychological:  Alert and cooperative. Normal mood and affect  ASSESSMENT AND PLAN: *Rectal bleeding: Small amounts periodically since September 10, occurs only with/after bowel movements.  Had hemorrhoid bandings earlier this year.  Colonoscopy December 2022 with only diverticulosis and internal hemorrhoids.  We discussed trying another banding with Dr. Fuller Plan.  We also discussed trying hydrocortisone makeshift suppositories, applying hydrocortisone cream on Preparation H suppositories at least at bedtime, possibly twice daily for several days in a row for a period of time to see if that helps.  He would like to try that first before doing another banding.  Otherwise if that is not successful and bleeding continues he is welcome to contact us back to schedule another banding with Dr. Fuller Plan.  Prescription was sent to his pharmacy.  This is all in the setting of Eliquis.   CC:  Binnie Rail, MD

## 2022-01-28 NOTE — Patient Instructions (Signed)
We have sent the following medications to your pharmacy for you to pick up at your convenience:hydrocortisone cream.  Purchase over the counter preparation H suppositories and coat cream on the the suppositories to insert twice daily.  Please contact our office if you decide you want a hemorrhoid banding.   The [facility] providers would like to encourage you to use [facility] to communicate with providers for non-urgent requests or questions.  Due to long hold times on the telephone, sending your provider a message by [facility] may be a faster and more efficient way to get a response.  Please allow 48 business hours for a response.  Please remember that this is for non-urgent requests.

## 2022-02-03 ENCOUNTER — Ambulatory Visit (INDEPENDENT_AMBULATORY_CARE_PROVIDER_SITE_OTHER): Payer: Medicare Other | Admitting: Neurology

## 2022-02-03 ENCOUNTER — Encounter: Payer: Self-pay | Admitting: Neurology

## 2022-02-03 VITALS — BP 134/70 | HR 53 | Ht 67.0 in | Wt 177.0 lb

## 2022-02-03 DIAGNOSIS — Z9889 Other specified postprocedural states: Secondary | ICD-10-CM | POA: Diagnosis not present

## 2022-02-03 DIAGNOSIS — I480 Paroxysmal atrial fibrillation: Secondary | ICD-10-CM

## 2022-02-03 DIAGNOSIS — G4761 Periodic limb movement disorder: Secondary | ICD-10-CM | POA: Diagnosis not present

## 2022-02-03 DIAGNOSIS — G2581 Restless legs syndrome: Secondary | ICD-10-CM

## 2022-02-03 DIAGNOSIS — F515 Nightmare disorder: Secondary | ICD-10-CM | POA: Diagnosis not present

## 2022-02-03 MED ORDER — ALPRAZOLAM 0.25 MG PO TABS: 0.2500 mg | ORAL_TABLET | Freq: Every evening | ORAL | 0 refills | Status: DC | PRN

## 2022-02-03 NOTE — Patient Instructions (Addendum)
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  3)  > 20 years ago UPPP for OSA.    My Plan is to proceed with:   1) attended sleep study with REM BD montage - parasomnia montage . 2) melatonin as first step , 5 mg or less at bedtime.  3) 0.25 mg Xanax or klonopin at bed time prn. Trazodone can be tried, too. Avoid Effexor , paxil, benadryl at night.  4) RLS  questionnaire- he hasn't struggled with them , but has PLMs.    I would like to thank Binnie Rail, MD and Binnie Rail, [place],  [place] [address] for allowing me to meet with and to take care of this pleasant patient.    In short, Daniel Tucker is presenting with REM BD, I plan to follow up either personally or through our NP within 4 months.    CC: I will share my notes with PCP.   Electronically signed by: Larey Seat, MD 02/03/2022 11:41 AM  RLS= not currently a problem.   Memory : subjectively not affected.
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
## 2022-02-03 NOTE — Addendum Note (Signed)
Addended by: Larey Seat on: 02/03/2022 12:34 PM   Modules accepted: Orders

## 2022-02-04 ENCOUNTER — Telehealth: Payer: Self-pay | Admitting: Neurology

## 2022-02-04 NOTE — Telephone Encounter (Signed)
Noted, we will pass along this information as well to the MD. We need to move forward with the testing work up to help with hopefully assisting with treatment.
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
## 2022-02-10 ENCOUNTER — Telehealth: Payer: Self-pay | Admitting: Neurology

## 2022-02-10 NOTE — Telephone Encounter (Signed)
02/04/22 Medicare/aarp no auth req EE  left VM to schedule 02/06/22 [place]

## 2022-02-11 NOTE — Telephone Encounter (Signed)
Patient called back.  NPSG- Medicare/aarp no auth req  He is scheduled at [facility] for 04/03/22 at 8 pm.  Mailed packet to the patient.

## 2022-02-19 ENCOUNTER — Other Ambulatory Visit (HOSPITAL_COMMUNITY): Payer: Self-pay | Admitting: Nurse Practitioner

## 2022-02-25 DIAGNOSIS — N403 Nodular prostate with lower urinary tract symptoms: Secondary | ICD-10-CM | POA: Diagnosis not present

## 2022-03-04 DIAGNOSIS — R351 Nocturia: Secondary | ICD-10-CM | POA: Diagnosis not present

## 2022-03-04 DIAGNOSIS — N5201 Erectile dysfunction due to arterial insufficiency: Secondary | ICD-10-CM | POA: Diagnosis not present

## 2022-03-04 DIAGNOSIS — N403 Nodular prostate with lower urinary tract symptoms: Secondary | ICD-10-CM | POA: Diagnosis not present

## 2022-03-21 ENCOUNTER — Telehealth: Payer: Self-pay | Admitting: Internal Medicine

## 2022-03-21 NOTE — Telephone Encounter (Signed)
N/A unable to leave a message for patient to call back to schedule Medicare Annual Wellness Visit   Last AWV  03/25/21  Please schedule at anytime with [facility] if patient calls the office back.     Any questions, please call me at [phone]

## 2022-04-03 ENCOUNTER — Ambulatory Visit (INDEPENDENT_AMBULATORY_CARE_PROVIDER_SITE_OTHER): Payer: Medicare Other | Admitting: Neurology

## 2022-04-03 DIAGNOSIS — G4731 Primary central sleep apnea: Secondary | ICD-10-CM

## 2022-04-03 DIAGNOSIS — G4720 Circadian rhythm sleep disorder, unspecified type: Secondary | ICD-10-CM

## 2022-04-03 DIAGNOSIS — F515 Nightmare disorder: Secondary | ICD-10-CM

## 2022-04-03 DIAGNOSIS — G4733 Obstructive sleep apnea (adult) (pediatric): Secondary | ICD-10-CM | POA: Diagnosis not present

## 2022-04-03 DIAGNOSIS — G2581 Restless legs syndrome: Secondary | ICD-10-CM

## 2022-04-03 DIAGNOSIS — Z9889 Other specified postprocedural states: Secondary | ICD-10-CM

## 2022-04-03 DIAGNOSIS — G4761 Periodic limb movement disorder: Secondary | ICD-10-CM

## 2022-04-03 DIAGNOSIS — I480 Paroxysmal atrial fibrillation: Secondary | ICD-10-CM

## 2022-04-09 ENCOUNTER — Ambulatory Visit (HOSPITAL_COMMUNITY): Payer: Medicare Other | Admitting: Nurse Practitioner

## 2022-04-09 ENCOUNTER — Telehealth: Payer: Self-pay | Admitting: Neurology

## 2022-04-09 DIAGNOSIS — G4733 Obstructive sleep apnea (adult) (pediatric): Secondary | ICD-10-CM

## 2022-04-09 DIAGNOSIS — I480 Paroxysmal atrial fibrillation: Secondary | ICD-10-CM

## 2022-04-09 DIAGNOSIS — G4752 REM sleep behavior disorder: Secondary | ICD-10-CM

## 2022-04-09 DIAGNOSIS — G4731 Primary central sleep apnea: Secondary | ICD-10-CM

## 2022-04-09 NOTE — Telephone Encounter (Signed)
This patient saw Dr. Brett Fairy for sleep evaluation on 02/03/22.  I read the PSG from 04/03/22 on Dr. Edwena Felty behalf:  Patient referred by Dr. Quay Burow.   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 I recommend treatment for this in the form of CPAP. This will require a repeat sleep study for proper titration and mask fitting and correct monitoring of the oxygen saturations. Please explain to patient. I have placed an order in the chart. Thanks.  Star Age, MD, PhD [facility])
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
## 2022-04-10 NOTE — Telephone Encounter (Signed)
Called [phone]. Relayed results per Dr. Guadelupe Sabin note. Pt verbalized understanding. He will be on look out for call from sleep lab to schedule cpap titration once approved by insurance.

## 2022-04-15 ENCOUNTER — Other Ambulatory Visit (HOSPITAL_COMMUNITY): Payer: Self-pay | Admitting: Nurse Practitioner

## 2022-04-16 ENCOUNTER — Telehealth: Payer: Self-pay | Admitting: Neurology

## 2022-04-16 NOTE — Telephone Encounter (Signed)
I spoke wit the patient.  CPAP- Medicare/aarp no auth req.  Patient is scheduled at [facility] for 06/24/22 at 8 pm.  Mailed packet to the patient.

## 2022-04-23 ENCOUNTER — Ambulatory Visit (HOSPITAL_COMMUNITY)
Admission: RE | Admit: 2022-04-23 | Discharge: 2022-04-23 | Disposition: A | Discharge status: Home / Self Care | Payer: Medicare Other | Source: Ambulatory Visit | Attending: Nurse Practitioner | Admitting: Nurse Practitioner

## 2022-04-23 VITALS — BP 152/84 | HR 55 | Ht 67.0 in | Wt 180.8 lb

## 2022-04-23 DIAGNOSIS — Z79899 Other long term (current) drug therapy: Secondary | ICD-10-CM | POA: Insufficient documentation

## 2022-04-23 DIAGNOSIS — I10 Essential (primary) hypertension: Secondary | ICD-10-CM | POA: Insufficient documentation

## 2022-04-23 DIAGNOSIS — G4733 Obstructive sleep apnea (adult) (pediatric): Secondary | ICD-10-CM | POA: Insufficient documentation

## 2022-04-23 DIAGNOSIS — I480 Paroxysmal atrial fibrillation: Secondary | ICD-10-CM | POA: Diagnosis not present

## 2022-04-23 LAB — CBC
HCT: 43 % (ref 39.0–52.0)
Hemoglobin: 14.2 g/dL (ref 13.0–17.0)
MCH: 32.6 pg (ref 26.0–34.0)
MCHC: 33 g/dL (ref 30.0–36.0)
MCV: 98.6 fL (ref 80.0–100.0)
Platelets: 198 10*3/uL (ref 150–400)
RBC: 4.36 MIL/uL (ref 4.22–5.81)
RDW: 12.4 % (ref 11.5–15.5)
WBC: 6 10*3/uL (ref 4.0–10.5)
nRBC: 0 % (ref 0.0–0.2)

## 2022-04-23 LAB — BASIC METABOLIC PANEL
Anion gap: 7 (ref 5–15)
BUN: 19 mg/dL (ref 8–23)
CO2: 29 mmol/L (ref 22–32)
Calcium: 9.2 mg/dL (ref 8.9–10.3)
Chloride: 100 mmol/L (ref 98–111)
Creatinine, Ser: 1.14 mg/dL (ref 0.61–1.24)
GFR, Estimated: >60 mL/min (ref >60)
Glucose, Bld: 118 mg/dL — ABNORMAL HIGH (ref 70–99)
Potassium: 4 mmol/L (ref 3.5–5.1)
Sodium: 136 mmol/L (ref 135–145)

## 2022-04-23 LAB — MAGNESIUM: Magnesium: 2.2 mg/dL (ref 1.7–2.4)

## 2022-04-23 NOTE — Progress Notes (Addendum)
Patient ID: Daniel Tucker, male   DOB: 07-Sep-1940, 82 y.o.   MRN: [id number]     Primary Care Physician: Binnie Rail, MD Referring Physician: Dr. Jovita Kussmaul is a 82 y.o. male with a h/o paroxysmal afib maintaining SR on tikosyn. He is in the afib clinic for f/u surveillance of Tikosyn. He did go thru a hip replacement in February 2020 without any issues. Short episode  afib lst week that responded well to extra dose of BB. Pending 2nd covid shot 3/3. Qtc is stable.   F/u in the afib clinic, 8/26, for surveillance ofTikosyn. He forgot his Tikosyn a couple of weeks ago and did have breakthrough afib.He did convert the next day. No further  afib.   F/u in afib clinic 10/03/20. He states that he has been staying in [place]. Rare episode of afib that resolves with extra metoprolol. He has lost some weight as he got a new puppy and is walking outside with it frequently. No issues with anticoagulation.   F/u in afib clinic, 12/19, no afib to report. His BP's at home are typically running 062-376 systolic. BP is  150/82 bpm . Discussed increasing amlodipine to 10 mg daily to better control BP. He is compliant with meds.   F/u afib clinic, 10/09/21 for tikosyn surveillance. He is in SR, one episode of  afib to report. Took extra BB and was back in SR in 1-2 hours. Otherwise, no concerns. Qt stable with dofetilide.  F/u in afib clinic, 04/23/22 for Tikosyn surveillance. He reports that he is doing well. No afib noted. No change in health. No issue with anticoagulation.  Today, he denies symptoms of palpitations, chest pain, shortness of breath, orthopnea, PND, lower extremity edema, dizziness, presyncope, syncope, or neurologic sequela. The patient is tolerating medications without difficulties and is otherwise without complaint today.   Past Medical History:  Diagnosis Date   Arthritis    "hips" (11/20/2014)   Benign essential HTN    Fasting hyperglycemia 2006   FBS 111; normal A1c    Hepatitis ~ 1955   "yellow juandice"   Hyperlipidemia    Internal hemorrhoids    Persistent atrial fibrillation ([place]) 08/24/2014   CHASD2VASC score is 3 - on Apixaban   Sleep apnea    intolerant to CPAP and now using oral device followed by Dr. Toy Cookey   Tubular adenoma of colon 2006   Dr Fuller Plan   Past Surgical History:  Procedure Laterality Date   CARDIOVERSION N/A 10/12/2014   Procedure: CARDIOVERSION;  Surgeon: Darlin Coco, MD;  Location: [facility];  Service: Cardiovascular;  Laterality: N/A;   COLONOSCOPY  2011   no polyp   COLONOSCOPY W/ POLYPECTOMY  2006   [place]   NASAL SINUS SURGERY  1997   PROSTATE BIOPSY  2010   Negative,Dr.Ottelin   TONSILLECTOMY AND ADENOIDECTOMY  1997   TOTAL HIP ARTHROPLASTY Left 08/2010   Dr Maureen Ralphs   TOTAL HIP ARTHROPLASTY Right 05/17/2018   Procedure: TOTAL HIP ARTHROPLASTY ANTERIOR APPROACH;  Surgeon: Gaynelle Arabian, MD;  Location: [facility];  Service: Orthopedics;  Laterality: Right;   UVULOPALATOPHARYNGOPLASTY  1997   Dr.Woliki    Current Outpatient Medications  Medication Sig Dispense Refill   acetaminophen (TYLENOL) 500 MG tablet Take 500 mg by mouth every 6 (six) hours as needed for moderate pain or headache.      acyclovir (ZOVIRAX) 200 MG capsule TAKE 2 CAPSULES EVERY DAY 180 capsule 1   amLODipine (NORVASC) 10 MG  tablet TAKE 1 TABLET EVERY DAY (DOSE CHANGE) 90 tablet 10   apixaban (ELIQUIS) 5 MG TABS tablet Take 1 tablet (5 mg total) by mouth 2 (two) times daily. 180 tablet 3   Ascorbic Acid (VITAMIN C) 1000 MG tablet Take 1,000 mg by mouth daily.     docusate sodium (COLACE) 100 MG capsule Take 100 mg by mouth 2 (two) times daily.      dofetilide (TIKOSYN) 500 MCG capsule TAKE 1 CAPSULE TWICE DAILY 180 capsule 2   GLUCOSAMINE SULFATE PO Take 2 tablets by mouth daily.      hydrocortisone (ANUSOL-HC) 2.5 % rectal cream Place 1 Application rectally 2 (two) times daily. 30 g 1   metoprolol tartrate (LOPRESSOR) 25 MG tablet TAKE 1/2  TABLET TWICE DAILY 90 tablet 1   Multiple Vitamin (MULTIVITAMIN) tablet Take 1 tablet by mouth daily.     Multiple Vitamins-Minerals (PRESERVISION AREDS 2 PO) Take 1 capsule by mouth 2 (two) times daily.      potassium chloride SA (KLOR-CON M) 20 MEQ tablet TAKE 2 TABLETS TWICE DAILY 360 tablet 3   pravastatin (PRAVACHOL) 20 MG tablet TAKE 1 TABLET EVERY DAY 90 tablet 1   sildenafil (VIAGRA) 100 MG tablet Take 100 mg by mouth daily as needed for erectile dysfunction.     ALPRAZolam (XANAX) 0.25 MG tablet Take 1 tablet (0.25 mg total) by mouth at bedtime as needed for sleep. (Patient not taking: Reported on 04/23/2022) 4 tablet 0   No current facility-administered medications for this encounter.    No Known Allergies  Social History   Socioeconomic History   Marital status: Married    Spouse name: Not on file   Number of children: 3   Years of education: Not on file   Highest education level: Not on file  Occupational History   Occupation: retired   Tobacco Use   Smoking status: Former    Packs/day: 1.00    Years: 7.00    Total pack years: 7.00    Types: Cigarettes    Quit date: 1968    Years since quitting: 56.0   Smokeless tobacco: Never   Tobacco comments:    smoked cigarettes age [age], up to 1ppd  Vaping Use   Vaping Use: Never used  Substance and Sexual Activity   Alcohol use: Yes    Alcohol/week: 2.0 standard drinks of alcohol    Types: 1 Glasses of wine, 1 Cans of beer per week    Comment: less than 1 drink per week   Drug use: No   Sexual activity: Yes  Other Topics Concern   Not on file  Social History Narrative   Not on file   Social Determinants of Health   Financial Resource Strain: Low Risk  (03/25/2021)   Overall Financial Resource Strain (CARDIA)    Difficulty of Paying Living Expenses: Not hard at all  Food Insecurity: No Food Insecurity (03/25/2021)   Hunger Vital Sign    Worried About Running Out of Food in the Last Year: Never true    Ran Out of  Food in the Last Year: Never true  Transportation Needs: No Transportation Needs (03/25/2021)   PRAPARE - Hydrologist (Medical): No    Lack of Transportation (Non-Medical): No  Physical Activity: Sufficiently Active (03/25/2021)   Exercise Vital Sign    Days of Exercise per Week: 5 days    Minutes of Exercise per Session: 30 min  Stress: No Stress Concern Present (  03/25/2021)   [place]    Feeling of Stress : Not at all  Social Connections: [place] (03/25/2021)   Social Connection and Isolation Panel [NHANES]    Frequency of Communication with Friends and Family: More than three times a week    Frequency of Social Gatherings with Friends and Family: More than three times a week    Attends Religious Services: More than 4 times per year    Active Member of [employer] or Organizations: Yes    Attends Music therapist: More than 4 times per year    Marital Status: Married  Human resources officer Violence: Not At Risk (03/25/2021)   Humiliation, Afraid, Rape, and Kick questionnaire    Fear of Current or Ex-Partner: No    Emotionally Abused: No    Physically Abused: No    Sexually Abused: No    Family History  Problem Relation Age of Onset   Hypertension Mother    Congestive Heart Failure Mother    Prostate cancer Father    Hypertension Father    Melanoma Sister    Lupus Sister    Prostate cancer Brother        mets to liver   Liver cancer Brother    Atrial fibrillation Brother    Melanoma Brother    Diabetes Neg Hx    Heart disease Neg Hx    Stroke Neg Hx    Colon cancer Neg Hx    Stomach cancer Neg Hx     ROS- All systems are reviewed and negative except as per the HPI above  Physical Exam: Vitals:   04/23/22 1047  Pulse: (!) 55  Weight: 82 kg  Height: _0  (1.702 m)    GEN- The patient is well appearing, alert and oriented x 3 today.   Head- normocephalic,  atraumatic Eyes-  Sclera clear, conjunctiva pink Ears- hearing intact Oropharynx- clear Neck- supple, no JVP Lymph- no cervical lymphadenopathy Lungs- Clear to ausculation bilaterally, normal work of breathing Heart- regular rate and rhythm  no murmurs, rubs or gallops, PMI not laterally displaced GI- soft, NT, ND, + BS Extremities- no clubbing, cyanosis, or edema MS- no significant deformity or atrophy Skin- no rash or lesion Psych- euthymic mood, full affect Neuro- strength and sensation are intact  Vent. rate 55 BPM PR interval 162 ms QRS duration 138 ms QT/QTcB 482/461 ms P-R-T axes 66 -45 -15 Sinus bradycardia with Premature atrial complexes in a pattern of bigeminy Right bundle branch block Left anterior fascicular block  Bifascicular block  Minimal voltage criteria for LVH, may be normal variant ( R in aVL ) Abnormal ECG When compared with ECG of 09-Oct-2021 11:03, PREVIOUS ECG IS PRESENT  Assessment and Plan: 1. Afib  No noted afib  Continue on tikosyn 500 mg bid and metoprolol 12.5  mg bid  qtc stable Continue apixaban 5 mg bid, no bleeding issues Bmet/mag/cbc today   2.OSA Still wearing mouthpiece  3. HTN  Stable  Mildly elevated today but well controlled  at home    F/u in 6 months    Butch Penny C. Benjamin Merrihew, [facility] [address] [place], [place] [address] [phone]

## 2022-05-15 ENCOUNTER — Telehealth: Payer: Self-pay

## 2022-05-15 NOTE — Progress Notes (Signed)
N/A unable to leave a message for patient to call back to schedule Medicare Annual Wellness Visit   Last AWV  03/25/21  Please schedule at anytime with [facility] if patient calls the office back.    30 Minutes appointment   Any questions, please call me at [phone]

## 2022-05-29 ENCOUNTER — Encounter (HOSPITAL_COMMUNITY): Payer: Self-pay | Admitting: *Deleted

## 2022-06-02 ENCOUNTER — Telehealth: Payer: Self-pay

## 2022-06-02 NOTE — Telephone Encounter (Signed)
Pt is scheduled tomorrow for sleep f/u but looks like Dr. Rexene Alberts rec'd completion of titration study which is not yet completed. Pt will need to f/u after titration study is completed, please r/s per NP. Thank you!

## 2022-06-02 NOTE — Telephone Encounter (Signed)
Called and LVM with information for the pt. Left office number for pt to call if any questions.

## 2022-06-02 NOTE — Telephone Encounter (Signed)
Noted, thank you

## 2022-06-03 ENCOUNTER — Ambulatory Visit: Payer: Medicare Other | Admitting: Adult Health

## 2022-06-24 ENCOUNTER — Ambulatory Visit (INDEPENDENT_AMBULATORY_CARE_PROVIDER_SITE_OTHER): Payer: Medicare Other | Admitting: Neurology

## 2022-06-24 DIAGNOSIS — G4731 Primary central sleep apnea: Secondary | ICD-10-CM

## 2022-06-24 DIAGNOSIS — I480 Paroxysmal atrial fibrillation: Secondary | ICD-10-CM

## 2022-06-24 DIAGNOSIS — G4733 Obstructive sleep apnea (adult) (pediatric): Secondary | ICD-10-CM

## 2022-06-24 DIAGNOSIS — G4752 REM sleep behavior disorder: Secondary | ICD-10-CM

## 2022-07-01 ENCOUNTER — Telehealth: Payer: Self-pay | Admitting: Neurology

## 2022-07-01 DIAGNOSIS — I480 Paroxysmal atrial fibrillation: Secondary | ICD-10-CM

## 2022-07-01 DIAGNOSIS — G4761 Periodic limb movement disorder: Secondary | ICD-10-CM

## 2022-07-01 DIAGNOSIS — Z9889 Other specified postprocedural states: Secondary | ICD-10-CM

## 2022-07-01 DIAGNOSIS — G4731 Primary central sleep apnea: Secondary | ICD-10-CM

## 2022-07-01 DIAGNOSIS — G4752 REM sleep behavior disorder: Secondary | ICD-10-CM
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
## 2022-07-01 NOTE — Telephone Encounter (Signed)
Pt's wife called wanting to know when they will receive a call regarding the Cpap Titration. Please advise.

## 2022-07-02 NOTE — Addendum Note (Signed)
Addended by: Darleen Crocker on: 07/02/2022 10:36 AM   Modules accepted: Orders

## 2022-07-02 NOTE — Telephone Encounter (Signed)
I called pt. I advised pt that Dr. Brett Fairy reviewed their sleep study results and found that pt tolerated the BiPAP well. Dr. Brett Fairy recommends that pt start BiPAP. I reviewed PAP compliance expectations with the pt. Pt is agreeable to starting a CPAP. I advised pt that an order will be sent to a DME, [facility], and [facility] will call the pt within about one week after they file with the pt's insurance. [facility] will show the pt how to use the machine, fit for masks, and troubleshoot the CPAP if needed. A follow up appt was made for insurance purposes with Dr. Brett Fairy on 09/30/22 at 1:30 pm. Pt verbalized understanding to arrive 15 minutes early and bring their CPAP. Pt verbalized understanding of results. Pt had no questions at this time but was encouraged to call back if questions arise. I have sent the order to [facility] and have received confirmation that they have received the order.

## 2022-07-25 ENCOUNTER — Other Ambulatory Visit (HOSPITAL_COMMUNITY): Payer: Self-pay | Admitting: *Deleted

## 2022-07-25 MED ORDER — APIXABAN 5 MG PO TABS: 5.0000 mg | ORAL_TABLET | Freq: Two times a day (BID) | ORAL | 3 refills | Status: DC

## 2022-08-19 ENCOUNTER — Telehealth: Payer: Self-pay | Admitting: Internal Medicine

## 2022-08-19 NOTE — Telephone Encounter (Signed)
Patient dropped off paperwork for facility that needs to be completed and faxed to [phone].  Placed in provider's box up front.

## 2022-08-20 NOTE — Telephone Encounter (Signed)
Received today and will complete on Thursday to fax back.

## 2022-08-24 ENCOUNTER — Emergency Department (HOSPITAL_COMMUNITY): Payer: Medicare Other

## 2022-08-24 ENCOUNTER — Other Ambulatory Visit: Payer: Self-pay

## 2022-08-24 ENCOUNTER — Emergency Department (HOSPITAL_COMMUNITY)
Admission: EM | Admit: 2022-08-24 | Discharge: 2022-08-25 | Disposition: A | Discharge status: Home / Self Care | Payer: Medicare Other | Attending: Emergency Medicine | Admitting: Emergency Medicine

## 2022-08-24 DIAGNOSIS — N281 Cyst of kidney, acquired: Secondary | ICD-10-CM | POA: Insufficient documentation

## 2022-08-24 DIAGNOSIS — I700 Atherosclerosis of aorta: Secondary | ICD-10-CM | POA: Diagnosis not present

## 2022-08-24 DIAGNOSIS — I10 Essential (primary) hypertension: Secondary | ICD-10-CM | POA: Insufficient documentation

## 2022-08-24 DIAGNOSIS — D72829 Elevated white blood cell count, unspecified: Secondary | ICD-10-CM | POA: Diagnosis not present

## 2022-08-24 DIAGNOSIS — Z7901 Long term (current) use of anticoagulants: Secondary | ICD-10-CM | POA: Insufficient documentation

## 2022-08-24 DIAGNOSIS — E871 Hypo-osmolality and hyponatremia: Secondary | ICD-10-CM | POA: Diagnosis not present

## 2022-08-24 DIAGNOSIS — R7309 Other abnormal glucose: Secondary | ICD-10-CM | POA: Diagnosis not present

## 2022-08-24 DIAGNOSIS — R0789 Other chest pain: Secondary | ICD-10-CM | POA: Diagnosis not present

## 2022-08-24 DIAGNOSIS — Z79899 Other long term (current) drug therapy: Secondary | ICD-10-CM | POA: Insufficient documentation

## 2022-08-24 DIAGNOSIS — M546 Pain in thoracic spine: Secondary | ICD-10-CM | POA: Diagnosis not present

## 2022-08-24 DIAGNOSIS — R600 Localized edema: Secondary | ICD-10-CM | POA: Diagnosis not present

## 2022-08-24 DIAGNOSIS — R079 Chest pain, unspecified: Secondary | ICD-10-CM | POA: Diagnosis not present

## 2022-08-24 DIAGNOSIS — J9811 Atelectasis: Secondary | ICD-10-CM | POA: Diagnosis not present

## 2022-08-24 LAB — CBC
HCT: 40.7 % (ref 39.0–52.0)
Hemoglobin: 13.9 g/dL (ref 13.0–17.0)
MCH: 33 pg (ref 26.0–34.0)
MCHC: 34.2 g/dL (ref 30.0–36.0)
MCV: 96.7 fL (ref 80.0–100.0)
Platelets: 216 10*3/uL (ref 150–400)
RBC: 4.21 MIL/uL — ABNORMAL LOW (ref 4.22–5.81)
RDW: 12.5 % (ref 11.5–15.5)
WBC: 11.6 10*3/uL — ABNORMAL HIGH (ref 4.0–10.5)
nRBC: 0 % (ref 0.0–0.2)

## 2022-08-24 LAB — BASIC METABOLIC PANEL
Anion gap: 11 (ref 5–15)
BUN: 17 mg/dL (ref 8–23)
CO2: 23 mmol/L (ref 22–32)
Calcium: 9.2 mg/dL (ref 8.9–10.3)
Chloride: 100 mmol/L (ref 98–111)
Creatinine, Ser: 0.96 mg/dL (ref 0.61–1.24)
GFR, Estimated: >60 mL/min (ref >60)
Glucose, Bld: 133 mg/dL — ABNORMAL HIGH (ref 70–99)
Potassium: 3.8 mmol/L (ref 3.5–5.1)
Sodium: 134 mmol/L — ABNORMAL LOW (ref 135–145)

## 2022-08-24 LAB — TROPONIN I (HIGH SENSITIVITY): Troponin I (High Sensitivity): 4 ng/L (ref <18)

## 2022-08-24 NOTE — ED Triage Notes (Signed)
Pt arrives with c/o back pain that radiates into his chest. Per pt, the pain is worse with movement. Pt denies SOB or n/v. Pt denies injury.

## 2022-08-25 ENCOUNTER — Emergency Department (HOSPITAL_COMMUNITY): Payer: Medicare Other

## 2022-08-25 ENCOUNTER — Encounter (HOSPITAL_COMMUNITY): Payer: Self-pay

## 2022-08-25 DIAGNOSIS — R0789 Other chest pain: Secondary | ICD-10-CM | POA: Diagnosis not present

## 2022-08-25 DIAGNOSIS — M546 Pain in thoracic spine: Secondary | ICD-10-CM | POA: Diagnosis not present

## 2022-08-25 DIAGNOSIS — N281 Cyst of kidney, acquired: Secondary | ICD-10-CM | POA: Diagnosis not present

## 2022-08-25 LAB — TROPONIN I (HIGH SENSITIVITY): Troponin I (High Sensitivity): 5 ng/L (ref <18)

## 2022-08-25 MED ORDER — IOHEXOL 350 MG/ML SOLN
75.0000 mL | Freq: Once | INTRAVENOUS | Status: AC | PRN
  Administered 2022-08-25: 75 mL via INTRAVENOUS

## 2022-08-25 MED ORDER — METHOCARBAMOL 500 MG PO TABS: 500.0000 mg | ORAL_TABLET | Freq: Two times a day (BID) | ORAL | 0 refills | Status: DC

## 2022-08-25 NOTE — ED Notes (Signed)
Pt ambulated to room in NAD

## 2022-08-25 NOTE — Discharge Instructions (Signed)
You were evaluated here in the emergency department for upper back pain that radiates to your chest. You were evaluated with EKG and heart enzymes and it does not appear that you are having a heart attack. You were evaluated with CTs of the chest and abdomen without evidence of acute abnormality noted. Based on your symptoms it appears that this is most likely musculoskeletal in etiology.  You are being treated with muscle relaxants. If your symptoms worsen or you have new symptoms, please return immediately for reevaluation. Please follow-up with your primary care doctor this week

## 2022-08-25 NOTE — ED Notes (Signed)
Pt reports onset of R upper back pain that radiates into his chest that started on Sat. Pt reports it was worse Sun. Denies shob, reports pain is worsened w/ movement and describes it as lying on a golf ball dull and achey.

## 2022-08-25 NOTE — Telephone Encounter (Signed)
Paperwork completed today.

## 2022-08-25 NOTE — ED Provider Notes (Signed)
[facility] AT [facility] Provider Note   CSN: [id number] Arrival date & time: 08/24/22  2043     History  Chief Complaint  Patient presents with   Chest Pain   Back Pain    Daniel Tucker is a 82 y.o. male.  HPI 82 year old male history of A-fib presents last night complaining of right upper back pain that began on Saturday.  It was sharp and worse with movement.  Yesterday it began radiating to the upper chest.  This also seem to be worse with movement.  Has waxed and waned and is worse with movement.  Is currently a 4.  He denies having similar pain in the past.  The pain is somewhat sharp.  He denies cough, fever, trauma, nausea, vomiting, diarrhea, urinary tract infection symptoms, history of biliary colic, change in appetite or worsening with eating. Patient is on Eliquis for A-fib.  He reports he took his last Eliquis last night.  He has a history of high blood pressure and high cholesterol.  He has a history of A-fib but denies any history of coronary artery disease.     Home Medications Prior to Admission medications   Medication Sig Start Date End Date Taking? Authorizing Provider  methocarbamol (ROBAXIN) 500 MG tablet Take 1 tablet (500 mg total) by mouth 2 (two) times daily. 08/25/22  Yes Margarita Grizzle, MD  acetaminophen (TYLENOL) 500 MG tablet Take 500 mg by mouth every 6 (six) hours as needed for moderate pain or headache.     [provider]  acyclovir (ZOVIRAX) 200 MG capsule TAKE 2 CAPSULES EVERY DAY 01/08/22   Burns, Bobette Mo, MD  ALPRAZolam Prudy Feeler) 0.25 MG tablet Take 1 tablet (0.25 mg total) by mouth at bedtime as needed for sleep. Patient not taking: Reported on 04/23/2022 02/03/22   Dohmeier, Porfirio Mylar, MD  amLODipine (NORVASC) 10 MG tablet TAKE 1 TABLET EVERY DAY (DOSE CHANGE) 02/20/22   Newman Nip, NP  apixaban (ELIQUIS) 5 MG TABS tablet Take 1 tablet (5 mg total) by mouth 2 (two) times daily. 07/25/22   Fenton, Clint R, PA   Ascorbic Acid (VITAMIN C) 1000 MG tablet Take 1,000 mg by mouth daily.    [provider]  docusate sodium (COLACE) 100 MG capsule Take 100 mg by mouth 2 (two) times daily.     [provider]  dofetilide (TIKOSYN) 500 MCG capsule TAKE 1 CAPSULE TWICE DAILY 10/24/21   Newman Nip, NP  GLUCOSAMINE SULFATE PO Take 2 tablets by mouth daily.     [provider]  hydrocortisone (ANUSOL-HC) 2.5 % rectal cream Place 1 Application rectally 2 (two) times daily. 01/28/22   Zehr, Shanda Bumps D, PA-C  metoprolol tartrate (LOPRESSOR) 25 MG tablet TAKE 1/2 TABLET TWICE DAILY 01/08/22   Newman Nip, NP  Multiple Vitamin (MULTIVITAMIN) tablet Take 1 tablet by mouth daily.    [provider]  Multiple Vitamins-Minerals (PRESERVISION AREDS 2 PO) Take 1 capsule by mouth 2 (two) times daily.     [provider]  potassium chloride SA (KLOR-CON M) 20 MEQ tablet TAKE 2 TABLETS TWICE DAILY 04/16/22   Newman Nip, NP  pravastatin (PRAVACHOL) 20 MG tablet TAKE 1 TABLET EVERY DAY 01/08/22   Pincus Sanes, MD  sildenafil (VIAGRA) 100 MG tablet Take 100 mg by mouth daily as needed for erectile dysfunction.    Bjorn Pippin, MD      Allergies    Patient has no known  allergies.    Review of Systems   Review of Systems  Physical Exam Updated Vital Signs BP (!) 142/79   Pulse 71   Temp 98.5 F (36.9 C) (Oral)   Resp 17   Wt 83.9 kg   SpO2 98%   BMI 28.98 kg/m  Physical Exam Vitals and nursing note reviewed.  Constitutional:      General: He is not in acute distress.    Appearance: He is well-developed.  HENT:     Head: Normocephalic and atraumatic.  Eyes:     Pupils: Pupils are equal, round, and reactive to light.  Cardiovascular:     Rate and Rhythm: Normal rate. Rhythm irregular.     Heart sounds: Normal heart sounds.  Pulmonary:     Effort: Pulmonary effort is normal. No tachypnea.     Breath sounds: Normal breath sounds.  Chest:     Chest wall:  No mass or deformity.     Comments: Mild tenderness to palpation right lower anterior chest No crepitus, warmth, redness, or fluctuance Abdominal:     General: Bowel sounds are normal.     Palpations: Abdomen is soft.  Musculoskeletal:        General: Normal range of motion.     Cervical back: Normal range of motion and neck supple.     Right lower leg: Edema present.     Left lower leg: Edema present.     Comments: Mild tenderness palpation under right scapula no point tenderness over thoracic spine No warmth, fluctuance, or signs of skin infection No crepitance  Skin:    General: Skin is warm and dry.     Capillary Refill: Capillary refill takes less than 2 seconds.  Neurological:     General: No focal deficit present.     Mental Status: He is alert.     ED Results / Procedures / Treatments   Labs (all labs ordered are listed, but only abnormal results are displayed) Labs Reviewed  BASIC METABOLIC PANEL - Abnormal; Notable for the following components:      Result Value   Sodium 134 (*)    Glucose, Bld 133 (*)    All other components within normal limits  CBC - Abnormal; Notable for the following components:   WBC 11.6 (*)    RBC 4.21 (*)    All other components within normal limits  TROPONIN I (HIGH SENSITIVITY)  TROPONIN I (HIGH SENSITIVITY)    EKG EKG Interpretation  Date/Time:  Sunday Aug 24 2022 21:13:48 EDT Ventricular Rate:  65 PR Interval:  156 QRS Duration: 140 QT Interval:  444 QTC Calculation: 461 R Axis:   -44 Text Interpretation: Normal sinus rhythm Left axis deviation Right bundle branch block Minimal voltage criteria for LVH, may be normal variant ( R in aVL ) Abnormal ECG When compared with ECG of 23-Apr-2022 11:07, PREVIOUS ECG IS PRESENT No significant change since last tracing Confirmed by Margarita Grizzle [id number]) on 08/25/2022 7:33:08 AM  Radiology DG Chest 2 View  Result Date: 08/24/2022 CLINICAL DATA:  Chest pain EXAM: CHEST - 2 VIEW  COMPARISON:  Chest x-Zafir Schauer dated Sep 10, 2021 FINDINGS: Mild rightward rotation. Cardiac and mediastinal contours are within normal limits. Mild left basilar atelectasis. No evidence of pleural effusion or pneumothorax. 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 Degenerative changes of the bilateral shoulders. IMPRESSION: No active cardiopulmonary disease. Electronically Signed   By: Allegra Lai M.D.   On: 08/24/2022 22:02    Procedures  Procedures    Medications Ordered in ED Medications  iohexol (OMNIPAQUE) 350 MG/ML injection 75 mL (75 mLs Intravenous Contrast Given 08/25/22 1030)    ED Course/ Medical Decision Making/ A&P Clinical Course as of 08/25/22 1507  Mon Aug 25, 2022  0750 Chest x-Kailah Pennel reviewed interpreted no evidence acute abnormality radiologist interpretation concurs [DR]  0750 EKG with right bundle branch block no change from prior [DR]  0751 First troponin is normal [DR]  0751 Met reviewed interpreted today for mild hyponatremia with sodium 134 glucose slightly elevated 133 otherwise within normal limits [DR]  0751 CBC reviewed interpreted send for mild leukocytosis at 11,600 otherwise it is within normal limits [DR]    Clinical Course User Index [DR] Margarita Grizzle, MD             HEART Score: 4                Medical Decision Making Amount and/or Complexity of Data Reviewed Labs: ordered. Radiology: ordered.  Risk Prescription drug management.   82 year old male presents today complaining of some sharp right-sided upper back pain below his scapula that radiates into the right upper chest.  Pain has been ongoing for several days.  There is been no known direct trauma.  He has not had any fever, chills, cough history of DVT or PE.  He is on anticoagulation for his A-fib. Differential diagnosis includes but is not limited to acute coronary syndrome, PE, aortic dissection, zoster, musculoskeletal strain, biliary colic Patient was evaluated here in the ED  with EKG shows a normal sinus rhythm with right bundle branch block no evidence of acute ischemia Troponin and repeat troponin are normal, based on troponin, EKG and type of pain I have a low index of suspicion of acute coronary syndrome.  Heart score is 4 with moderate risk.  Based on duration of symptoms and ongoing symptoms here, feel that this is not acute coronary syndrome CBC reviewed interpreted and no evidence of anemia mildly elevated white blood cell count CT angio abdomen and pelvis did not show any evidence of PE or acute pulmonary parenchymal findings no acute findings in the abdomen or pelvis.  There is a renal cyst noted that no follow-up imaging is recommended for Patient continues to complain of pain with movement.  I felt this is likely musculoskeletal.  Will start on muscle relaxants and advised regarding need for close follow-up and return precautions voices understanding.          Final Clinical Impression(s) / ED Diagnoses Final diagnoses:  Acute right-sided thoracic back pain    Rx / DC Orders ED Discharge Orders          Ordered    methocarbamol (ROBAXIN) 500 MG tablet  2 times daily        08/25/22 1505              Margarita Grizzle, MD 08/25/22 1507

## 2022-08-25 NOTE — ED Notes (Signed)
Pt in CT.

## 2022-08-25 NOTE — ED Notes (Signed)
Updated pts wife.

## 2022-08-25 NOTE — ED Notes (Signed)
Trop sent

## 2022-08-26 ENCOUNTER — Other Ambulatory Visit (HOSPITAL_COMMUNITY): Payer: Self-pay | Admitting: *Deleted

## 2022-08-26 ENCOUNTER — Other Ambulatory Visit: Payer: Self-pay | Admitting: Internal Medicine

## 2022-08-26 DIAGNOSIS — I480 Paroxysmal atrial fibrillation: Secondary | ICD-10-CM

## 2022-08-26 MED ORDER — METOPROLOL TARTRATE 25 MG PO TABS: 12.5000 mg | ORAL_TABLET | Freq: Two times a day (BID) | ORAL | 1 refills | Status: DC

## 2022-08-26 NOTE — Telephone Encounter (Signed)
Faxed yesterday for patient and spouse.  Conformation received.

## 2022-08-28 ENCOUNTER — Encounter: Payer: Self-pay | Admitting: Internal Medicine

## 2022-08-28 NOTE — Progress Notes (Signed)
Subjective:    Patient ID: Daniel Tucker, male    DOB: 1940/11/23, 82 y.o.   MRN: [id number]     HPI Jaydon is here for follow up from the ED  5/5 - went to ED for chest pain, back pain.   Had right upper back pain that started day prior - sharp, worse w/ movement.  Radiated to upper chest.  Worse with movement.  Pain is sharp.  No cough, N/V, fever.    Labs, EKG, trop, cxr neg.  CTA chest  - neg for PE.  Ct AB/pelvis - left renal cyst - benign. No acute findings  Pain likely msk.  Rx'd methocarbamol 500 mg bid.   He did take some of the methocarbamol.  He has minimal symptoms but it is much better.      Medications and allergies reviewed with patient and updated if appropriate.  Current Outpatient Medications on File Prior to Visit  Medication Sig Dispense Refill   acetaminophen (TYLENOL) 500 MG tablet Take 500 mg by mouth every 6 (six) hours as needed for moderate pain or headache.      acyclovir (ZOVIRAX) 200 MG capsule TAKE 2 CAPSULES EVERY DAY 180 capsule 1   ALPRAZolam (XANAX) 0.25 MG tablet Take 1 tablet (0.25 mg total) by mouth at bedtime as needed for sleep. (Patient not taking: Reported on 04/23/2022) 4 tablet 0   amLODipine (NORVASC) 10 MG tablet TAKE 1 TABLET EVERY DAY (DOSE CHANGE) 90 tablet 10   apixaban (ELIQUIS) 5 MG TABS tablet Take 1 tablet (5 mg total) by mouth 2 (two) times daily. 180 tablet 3   Ascorbic Acid (VITAMIN C) 1000 MG tablet Take 1,000 mg by mouth daily.     docusate sodium (COLACE) 100 MG capsule Take 100 mg by mouth 2 (two) times daily.      dofetilide (TIKOSYN) 500 MCG capsule TAKE 1 CAPSULE TWICE DAILY 180 capsule 2   GLUCOSAMINE SULFATE PO Take 2 tablets by mouth daily.      hydrocortisone (ANUSOL-HC) 2.5 % rectal cream Place 1 Application rectally 2 (two) times daily. 30 g 1   methocarbamol (ROBAXIN) 500 MG tablet Take 1 tablet (500 mg total) by mouth 2 (two) times daily. 20 tablet 0   metoprolol tartrate (LOPRESSOR) 25 MG tablet Take  0.5 tablets (12.5 mg total) by mouth 2 (two) times daily. 90 tablet 1   Multiple Vitamin (MULTIVITAMIN) tablet Take 1 tablet by mouth daily.     Multiple Vitamins-Minerals (PRESERVISION AREDS 2 PO) Take 1 capsule by mouth 2 (two) times daily.      potassium chloride SA (KLOR-CON M) 20 MEQ tablet TAKE 2 TABLETS TWICE DAILY 360 tablet 3   pravastatin (PRAVACHOL) 20 MG tablet TAKE 1 TABLET EVERY DAY 90 tablet 1   sildenafil (VIAGRA) 100 MG tablet Take 100 mg by mouth daily as needed for erectile dysfunction.     No current facility-administered medications on file prior to visit.     Review of Systems     Objective:  There were no vitals filed for this visit. BP Readings from Last 3 Encounters:  08/25/22 (!) 147/86  04/23/22 (!) 152/84  02/03/22 134/70   Wt Readings from Last 3 Encounters:  08/24/22 185 lb (83.9 kg)  04/23/22 180 lb 12.8 oz (82 kg)  02/03/22 177 lb (80.3 kg)   There is no height or weight on file to calculate BMI.    Physical Exam Constitutional:  General: He is not in acute distress.    Appearance: Normal appearance. He is not ill-appearing.  HENT:     Head: Normocephalic and atraumatic.  Eyes:     Conjunctiva/sclera: Conjunctivae normal.  Cardiovascular:     Rate and Rhythm: Normal rate and regular rhythm.     Heart sounds: Normal heart sounds.  Pulmonary:     Effort: Pulmonary effort is normal. No respiratory distress.     Breath sounds: Normal breath sounds. No wheezing or rales.  Musculoskeletal:        General: No tenderness.     Right lower leg: No edema.     Left lower leg: No edema.  Skin:    General: Skin is warm and dry.     Findings: No rash.  Neurological:     Mental Status: He is alert. Mental status is at baseline.  Psychiatric:        Mood and Affect: Mood normal.        Lab Results  Component Value Date   WBC 11.6 (H) 08/24/2022   HGB 13.9 08/24/2022   HCT 40.7 08/24/2022   PLT 216 08/24/2022   GLUCOSE 133 (H)  08/24/2022   CHOL 154 01/11/2020   TRIG 130 01/11/2020   HDL 45 01/11/2020   LDLCALC 86 01/11/2020   ALT 16 01/11/2020   AST 20 01/11/2020   NA 134 (L) 08/24/2022   K 3.8 08/24/2022   CL 100 08/24/2022   CREATININE 0.96 08/24/2022   BUN 17 08/24/2022   CO2 23 08/24/2022   TSH 1.31 01/11/2020   PSA 0.43 09/05/2015   INR 1.13 05/12/2018   HGBA1C 5.8 (H) 01/11/2020     Assessment & Plan:    See Problem List for Assessment and Plan of chronic medical problems.

## 2022-08-28 NOTE — Patient Instructions (Addendum)
        Medications changes include :   none - methocarbamol as needed      Return if symptoms worsen or fail to improve.

## 2022-08-29 ENCOUNTER — Telehealth: Payer: Self-pay

## 2022-08-29 ENCOUNTER — Ambulatory Visit (INDEPENDENT_AMBULATORY_CARE_PROVIDER_SITE_OTHER): Payer: Medicare Other | Admitting: Internal Medicine

## 2022-08-29 VITALS — BP 140/66 | HR 56 | Temp 98.4°F | Ht 67.0 in | Wt 175.0 lb

## 2022-08-29 DIAGNOSIS — H25012 Cortical age-related cataract, left eye: Secondary | ICD-10-CM | POA: Diagnosis not present

## 2022-08-29 DIAGNOSIS — H2513 Age-related nuclear cataract, bilateral: Secondary | ICD-10-CM | POA: Diagnosis not present

## 2022-08-29 DIAGNOSIS — M546 Pain in thoracic spine: Secondary | ICD-10-CM

## 2022-08-29 DIAGNOSIS — I10 Essential (primary) hypertension: Secondary | ICD-10-CM | POA: Diagnosis not present

## 2022-08-29 DIAGNOSIS — H25043 Posterior subcapsular polar age-related cataract, bilateral: Secondary | ICD-10-CM | POA: Diagnosis not present

## 2022-08-29 DIAGNOSIS — M549 Dorsalgia, unspecified: Secondary | ICD-10-CM | POA: Insufficient documentation

## 2022-08-29 DIAGNOSIS — H43813 Vitreous degeneration, bilateral: Secondary | ICD-10-CM | POA: Diagnosis not present

## 2022-08-29 NOTE — Assessment & Plan Note (Signed)
Acute Recent ED for same Work up negative Msk in nature It is improved and almost completely resolved Continue methocarbamol as needed

## 2022-08-29 NOTE — Assessment & Plan Note (Signed)
Chronic BP adequately controlled Current regimen effective and well tolerated Continue amlodipine 10 mg daily, metoprolol 12.5 mg bid

## 2022-08-29 NOTE — Telephone Encounter (Signed)
Transition Care Management Unsuccessful Follow-up Telephone Call  Date of discharge and from where:  08/25/2022 [facility]  Attempts:  1st Attempt  Reason for unsuccessful TCM follow-up call:  Left voice message  Delara Shepheard [facility]  [facility] Population Health Community Resource Care Guide   ??[email or web address]  ?? [phone]   Website: [email or web address]  [email or web address]

## 2022-09-01 ENCOUNTER — Ambulatory Visit: Payer: Medicare Other | Admitting: Family Medicine

## 2022-09-03 ENCOUNTER — Telehealth: Payer: Self-pay

## 2022-09-03 NOTE — Telephone Encounter (Signed)
Transition Care Management Follow-up Telephone Call Date of discharge and from where: 08/25/2022 [facility] How have you been since you were released from the hospital? Patient stated he is feeling much better Any questions or concerns? No  Items Reviewed: Did the pt receive and understand the discharge instructions provided? Yes  Medications obtained and verified? Yes  Other? No  Any new allergies since your discharge? No  Dietary orders reviewed? Yes Do you have support at home? Yes   Follow up appointments reviewed:  PCP Hospital f/u appt confirmed? Yes  Scheduled to see Cheryll Cockayne on 08/29/2022 @ [facility] at [place]. Specialist Hospital f/u appt confirmed? Yes  Scheduled to see Melvyn Novas MD on 09/30/2022 @ [facility]. Are transportation arrangements needed? No  If their condition worsens, is the pt aware to call PCP or go to the Emergency Dept.? Yes Was the patient provided with contact information for the PCP's office or ED? Yes Was to pt encouraged to call back with questions or concerns? Yes  Dwanda Tufano [facility]  [facility] Population Health Community Resource Care Guide   [email or web address]  ?? [phone]   Website: [email or web address]  [email or web address]

## 2022-09-09 ENCOUNTER — Other Ambulatory Visit (HOSPITAL_COMMUNITY): Payer: Self-pay | Admitting: *Deleted

## 2022-09-09 MED ORDER — DOFETILIDE 500 MCG PO CAPS: 500.0000 ug | ORAL_CAPSULE | Freq: Two times a day (BID) | ORAL | 2 refills | Status: DC

## 2022-09-30 ENCOUNTER — Ambulatory Visit: Payer: PRIVATE HEALTH INSURANCE | Admitting: Neurology

## 2022-09-30 ENCOUNTER — Telehealth: Payer: Self-pay | Admitting: Neurology

## 2022-09-30 NOTE — Telephone Encounter (Signed)
LVM and sent mychart msg informing pt of need to reschedule 09/30/22 appt - MD out

## 2022-10-09 NOTE — Telephone Encounter (Signed)
Called pt. Initial BIPAP f/u got r/s to 10/29/2022 which too far out. Needs appt between 09/21/2022-10/21/22. I r/s his appt to be w/ AL,NP 10/13/22 at 1:30pm. Verified report can be pulled from resmed airview.

## 2022-10-13 ENCOUNTER — Ambulatory Visit (INDEPENDENT_AMBULATORY_CARE_PROVIDER_SITE_OTHER): Payer: Medicare Other | Admitting: Family Medicine

## 2022-10-13 ENCOUNTER — Encounter: Payer: Self-pay | Admitting: Family Medicine

## 2022-10-13 VITALS — BP 147/86 | HR 76 | Ht 68.0 in | Wt 185.0 lb

## 2022-10-13 DIAGNOSIS — G4761 Periodic limb movement disorder: Secondary | ICD-10-CM

## 2022-10-13 DIAGNOSIS — G4730 Sleep apnea, unspecified: Secondary | ICD-10-CM

## 2022-10-13 DIAGNOSIS — G4731 Primary central sleep apnea: Secondary | ICD-10-CM | POA: Diagnosis not present

## 2022-10-13 NOTE — Patient Instructions (Addendum)
Please continue using your BiPAP regularly. While your insurance requires that you use BiPAP at least 4 hours each night on 70% of the nights, I recommend, that you not skip any nights and use it throughout the night if you can. Getting used to BiPAP and staying with the treatment long term does take time and patience and discipline. Untreated obstructive sleep apnea when it is moderate to severe can have an adverse impact on cardiovascular health and raise her risk for heart disease, arrhythmias, hypertension, congestive heart failure, stroke and diabetes. Untreated obstructive sleep apnea causes sleep disruption, nonrestorative sleep, and sleep deprivation. This can have an impact on your day to day functioning and cause daytime sleepiness and impairment of cognitive function, memory loss, mood disturbance, and problems focussing. Using BiPAP regularly can improve these symptoms.  We will increase pressure settings to see if we can get the apneic events little lower. Continue melatonin for sleep aid.   Follow up with Dr Vickey Huger in 3-4 months   Management of Memory Problems   There are some general things you can do to help manage your memory problems.  Your memory may not in fact recover, but by using techniques and strategies you will be able to manage your memory difficulties better.   1)  Establish a routine. Try to establish and then stick to a regular routine.  By doing this, you will get used to what to expect and you will reduce the need to rely on your memory.  Also, try to do things at the same time of day, such as taking your medication or checking your calendar first thing in the morning. Think about think that you can do as a part of a regular routine and make a list.  Then enter them into a daily planner to remind you.  This will help you establish a routine.   2)  Organize your environment. Organize your environment so that it is uncluttered.  Decrease visual stimulation.  Place  everyday items such as keys or cell phone in the same place every day (ie.  Basket next to front door) Use post it notes with a brief message to yourself (ie. Turn off light, lock the door) Use labels to indicate where things go (ie. Which cupboards are for food, dishes, etc.) Keep a notepad and pen by the telephone to take messages   3)  Memory Aids A diary or journal/notebook/daily planner Making a list (shopping list, chore list, to do list that needs to be done) Using an alarm as a reminder (kitchen timer or cell phone alarm) Using cell phone to store information (Notes, Calendar, Reminders) Calendar/White board placed in a prominent position Post-it notes   In order for memory aids to be useful, you need to have good habits.  It's no good remembering to make a note in your journal if you don't remember to look in it.  Try setting aside a certain time of day to look in journal.   4)  Improving mood and managing fatigue. There may be other factors that contribute to memory difficulties.  Factors, such as anxiety, depression and tiredness can affect memory. Regular gentle exercise can help improve your mood and give you more energy. Exercise: there are short videos created by the [employer] on Health specially for older adults: https://bit.ly/2I30q97.  Mediterranean diet: which emphasizes fruits, vegetables, whole grains, legumes, fish, and other seafood; unsaturated fats such as olive oils; and low amounts of red meat, eggs, and sweets.  A variation of this, called MIND (Mediterranean-DASH Intervention for Neurodegenerative Delay) incorporates the DASH (Dietary Approaches to Stop Hypertension) diet, which has been shown to lower high blood pressure, a risk factor for Alzheimer's disease. More information at: [employer]Marketing.de.  Aerobic exercise that improve heart health is also good for the mind.  [employer] on Aging have short videos for exercises that you can do at home: BlindWorkshop.com.pt Simple relaxation techniques may help relieve symptoms of anxiety Try to get back to completing activities or hobbies you enjoyed doing in the past. Learn to pace yourself through activities to decrease fatigue. Find out about some local support groups where you can share experiences with others. Try and achieve 7-8 hours of sleep at night.
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
## 2022-10-28 ENCOUNTER — Ambulatory Visit (HOSPITAL_COMMUNITY)
Admission: RE | Admit: 2022-10-28 | Discharge: 2022-10-28 | Disposition: A | Discharge status: Home / Self Care | Payer: Medicare Other | Source: Ambulatory Visit | Attending: Internal Medicine | Admitting: Internal Medicine

## 2022-10-28 ENCOUNTER — Encounter (HOSPITAL_COMMUNITY): Payer: Self-pay | Admitting: Internal Medicine

## 2022-10-28 VITALS — BP 136/74 | HR 57 | Ht 68.0 in | Wt 185.0 lb

## 2022-10-28 DIAGNOSIS — G4733 Obstructive sleep apnea (adult) (pediatric): Secondary | ICD-10-CM | POA: Insufficient documentation

## 2022-10-28 DIAGNOSIS — I480 Paroxysmal atrial fibrillation: Secondary | ICD-10-CM | POA: Diagnosis not present

## 2022-10-28 DIAGNOSIS — D6869 Other thrombophilia: Secondary | ICD-10-CM

## 2022-10-28 DIAGNOSIS — Z79899 Other long term (current) drug therapy: Secondary | ICD-10-CM | POA: Insufficient documentation

## 2022-10-28 DIAGNOSIS — I4819 Other persistent atrial fibrillation: Secondary | ICD-10-CM | POA: Insufficient documentation

## 2022-10-28 DIAGNOSIS — I4891 Unspecified atrial fibrillation: Secondary | ICD-10-CM | POA: Diagnosis not present

## 2022-10-28 DIAGNOSIS — Z7901 Long term (current) use of anticoagulants: Secondary | ICD-10-CM | POA: Insufficient documentation

## 2022-10-28 DIAGNOSIS — Z5181 Encounter for therapeutic drug level monitoring: Secondary | ICD-10-CM | POA: Diagnosis not present

## 2022-10-28 DIAGNOSIS — I10 Essential (primary) hypertension: Secondary | ICD-10-CM | POA: Diagnosis not present

## 2022-10-28 LAB — BASIC METABOLIC PANEL
Anion gap: 6 (ref 5–15)
BUN: 15 mg/dL (ref 8–23)
CO2: 30 mmol/L (ref 22–32)
Calcium: 9.5 mg/dL (ref 8.9–10.3)
Chloride: 104 mmol/L (ref 98–111)
Creatinine, Ser: 1.02 mg/dL (ref 0.61–1.24)
GFR, Estimated: >60 mL/min (ref >60)
Glucose, Bld: 126 mg/dL — ABNORMAL HIGH (ref 70–99)
Potassium: 4.2 mmol/L (ref 3.5–5.1)
Sodium: 140 mmol/L (ref 135–145)

## 2022-10-28 LAB — MAGNESIUM: Magnesium: 2.3 mg/dL (ref 1.7–2.4)

## 2022-10-28 NOTE — Progress Notes (Signed)
Patient ID: Daniel Tucker, male   DOB: 10-Mar-1941, 82 y.o.   MRN: [id number]     Primary Care Physician: Pincus Sanes, MD Referring Physician: Dr. [place] Pink is a 82 y.o. male with a h/o paroxysmal afib maintaining SR on tikosyn. He is in the afib clinic for f/u surveillance of Tikosyn. He did go thru a hip replacement in February 2020 without any issues. Short episode  afib lst week that responded well to extra dose of BB. Pending 2nd covid shot 3/3. Qtc is stable.   F/u in the afib clinic, 8/26, for surveillance ofTikosyn. He forgot his Tikosyn a couple of weeks ago and did have breakthrough afib.He did convert the next day. No further  afib.   F/u in afib clinic 10/03/20. He states that he has been staying in SR. Rare episode of afib that resolves with extra metoprolol. He has lost some weight as he got a new puppy and is walking outside with it frequently. No issues with anticoagulation.   F/u in afib clinic, 12/19, no afib to report. His BP's at home are typically running 140-150 systolic. BP is  150/82 bpm . Discussed increasing amlodipine to 10 mg daily to better control BP. He is compliant with meds.   F/u afib clinic, 10/09/21 for tikosyn surveillance. He is in SR, one episode of  afib to report. Took extra BB and was back in SR in 1-2 hours. Otherwise, no concerns. Qt stable with dofetilide.  F/u in afib clinic, 04/23/22 for Tikosyn surveillance. He reports that he is doing well. No afib noted. No change in health. No issue with anticoagulation.  F/u in Afib clinic, 10/28/22 for Tikosyn surveillance. He is currently in NSR. No episodes of Afib to report. Patient takes Tikosyn 500 mcg BID. No missed doses of Tikosyn or Eliquis.   Today, he denies symptoms of palpitations, chest pain, shortness of breath, orthopnea, PND, lower extremity edema, dizziness, presyncope, syncope, or neurologic sequela. The patient is tolerating medications without difficulties and is otherwise  without complaint today.   Past Medical History:  Diagnosis Date   Arthritis    "hips" (11/20/2014)   Benign essential HTN    Fasting hyperglycemia 2006   FBS 111; normal A1c   Hepatitis ~ 1955   "yellow juandice"   Hyperlipidemia    Internal hemorrhoids    Persistent atrial fibrillation (HCC) 08/24/2014   CHASD2VASC score is 3 - on Apixaban   Sleep apnea    intolerant to CPAP and now using oral device followed by Dr. Toni Arthurs   Tubular adenoma of colon 2006   Dr Russella Dar   Past Surgical History:  Procedure Laterality Date   CARDIOVERSION N/A 10/12/2014   Procedure: CARDIOVERSION;  Surgeon: Cassell Clement, MD;  Location: [facility] ENDOSCOPY;  Service: Cardiovascular;  Laterality: N/A;   COLONOSCOPY  2011   no polyp   COLONOSCOPY W/ POLYPECTOMY  2006   [place]   NASAL SINUS SURGERY  1997   PROSTATE BIOPSY  2010   Negative,Dr.Ottelin   TONSILLECTOMY AND ADENOIDECTOMY  1997   TOTAL HIP ARTHROPLASTY Left 08/2010   Dr Despina Hick   TOTAL HIP ARTHROPLASTY Right 05/17/2018   Procedure: TOTAL HIP ARTHROPLASTY ANTERIOR APPROACH;  Surgeon: Ollen Gross, MD;  Location: [facility] ORS;  Service: Orthopedics;  Laterality: Right;   UVULOPALATOPHARYNGOPLASTY  1997   Dr.Woliki    Current Outpatient Medications  Medication Sig Dispense Refill   acetaminophen (TYLENOL) 500 MG tablet Take 500 mg by mouth every  6 (six) hours as needed for moderate pain or headache.      acyclovir (ZOVIRAX) 200 MG capsule TAKE 2 CAPSULES EVERY DAY 180 capsule 1   amLODipine (NORVASC) 10 MG tablet TAKE 1 TABLET EVERY DAY (DOSE CHANGE) 90 tablet 10   apixaban (ELIQUIS) 5 MG TABS tablet Take 1 tablet (5 mg total) by mouth 2 (two) times daily. 180 tablet 3   Ascorbic Acid (VITAMIN C) 1000 MG tablet Take 1,000 mg by mouth daily.     docusate sodium (COLACE) 100 MG capsule Take 100 mg by mouth 2 (two) times daily.      dofetilide (TIKOSYN) 500 MCG capsule Take 1 capsule (500 mcg total) by mouth 2 (two) times daily. 180 capsule 2    GLUCOSAMINE SULFATE PO Take 2 tablets by mouth daily.      metoprolol tartrate (LOPRESSOR) 25 MG tablet Take 0.5 tablets (12.5 mg total) by mouth 2 (two) times daily. 90 tablet 1   Multiple Vitamin (MULTIVITAMIN) tablet Take 1 tablet by mouth daily.     Multiple Vitamins-Minerals (PRESERVISION AREDS 2 PO) Take 1 capsule by mouth 2 (two) times daily.      potassium chloride SA (KLOR-CON M) 20 MEQ tablet TAKE 2 TABLETS TWICE DAILY 360 tablet 3   pravastatin (PRAVACHOL) 20 MG tablet TAKE 1 TABLET EVERY DAY 90 tablet 1   sildenafil (VIAGRA) 100 MG tablet Take 100 mg by mouth daily as needed for erectile dysfunction.     hydrocortisone (ANUSOL-HC) 2.5 % rectal cream Place 1 Application rectally 2 (two) times daily. (Patient not taking: Reported on 10/28/2022) 30 g 1   methocarbamol (ROBAXIN) 500 MG tablet Take 1 tablet (500 mg total) by mouth 2 (two) times daily. (Patient not taking: Reported on 10/28/2022) 20 tablet 0   No current facility-administered medications for this encounter.    No Known Allergies  ROS- All systems are reviewed and negative except as per the HPI above  Physical Exam: Vitals:   10/28/22 1055  BP: 136/74  Pulse: (!) 57  Weight: 83.9 kg  Height: 5\' 8"  (1.727 m)    GEN- The patient is well appearing, alert and oriented x 3 today.   Neck - no JVD or carotid bruit noted Lungs- Clear to ausculation bilaterally, normal work of breathing Heart- Regular rate and rhythm, no murmurs, rubs or gallops, PMI not laterally displaced Extremities- no clubbing, cyanosis, or edema Skin - no rash or ecchymosis noted  EKG- Vent. rate 57 BPM PR interval 158 ms QRS duration 138 ms QT/QTcB 456/443 ms P-R-T axes 56 -42 -23 Sinus bradycardia Left axis deviation Right bundle branch block Minimal voltage criteria for LVH, may be normal variant ( R in aVL ) Abnormal ECG When compared with ECG of 24-Aug-2022 21:13, PREVIOUS ECG IS PRESENT  ECHO 09/11/14: Study Conclusions   - Left  ventricle: The cavity size was mildly dilated. Wall    thickness was normal. Systolic function was normal. The estimated    ejection fraction was in the range of 55% to 60%. Wall motion was    normal; there were no regional wall motion abnormalities.  - Mitral valve: There was mild regurgitation.  - Left atrium: The atrium was moderately to severely dilated.  - Right atrium: The atrium was moderately dilated.  - Pulmonary arteries: Systolic pressure was mildly increased. PA    peak pressure: 42 mm Hg (S).   Assessment and Plan: 1. Afib  He is currently in NSR.  Qtc stable. Continue on  tikosyn 500 mcg bid and metoprolol 12.5 mg bid  Continue apixaban 5 mg bid, no bleeding issues Bmet/mag today  2.OSA He is compliant with therapy.  3. HTN  Stable  No changes at this time.   F/u in 6 months    Lake Bells, PA-C Afib Clinic [facility] [address] [place], [place] [address] [phone]

## 2022-10-29 ENCOUNTER — Ambulatory Visit: Payer: Medicare Other | Admitting: Adult Health

## 2022-11-05 ENCOUNTER — Ambulatory Visit (INDEPENDENT_AMBULATORY_CARE_PROVIDER_SITE_OTHER): Payer: Medicare Other

## 2022-11-05 DIAGNOSIS — Z0000 Encounter for general adult medical examination without abnormal findings: Secondary | ICD-10-CM

## 2022-11-05 NOTE — Progress Notes (Addendum)
Subjective:   Daniel Tucker is a 82 y.o. male who presents for Medicare Annual/Subsequent preventive examination.  Visit Complete: Virtual  I connected with  Daniel Tucker on 11/05/22 by a video and audio enabled telemedicine application and verified that I am speaking with the correct person using two identifiers.  Patient Location: Home  Provider Location: Home Office  I discussed the limitations of evaluation and management by telemedicine. The patient expressed understanding and agreed to proceed.  Patient was unable to self-report due to a lack of equipment at home via telehealth.  Review of Systems    Defer to PCP Cardiac Risk Factors include: advanced age (>4men, >26 women);hypertension     Objective:    There were no vitals filed for this visit. There is no height or weight on file to calculate BMI.     11/05/2022    4:29 PM 08/24/2022    9:04 PM 03/25/2021    1:57 PM 01/11/2020   11:43 AM 05/17/2018    1:00 PM 05/12/2018    1:51 PM 09/11/2017    3:15 PM  Advanced Directives  Does Patient Have a Medical Advance Directive? Yes No No No No No No  Type of Estate agent of [place];Living will        Does patient want to make changes to medical advance directive? No - Patient declined   No - Patient declined     Copy of Healthcare Power of Attorney in Chart? No - copy requested        Would patient like information on creating a medical advance directive?  No - Patient declined No - Patient declined No - Patient declined No - Patient declined No - Patient declined No - Patient declined    Current Medications (verified) Outpatient Encounter Medications as of 11/05/2022  Medication Sig   acetaminophen (TYLENOL) 500 MG tablet Take 500 mg by mouth every 6 (six) hours as needed for moderate pain or headache.    acyclovir (ZOVIRAX) 200 MG capsule TAKE 2 CAPSULES EVERY DAY   amLODipine (NORVASC) 10 MG tablet TAKE 1 TABLET EVERY DAY (DOSE CHANGE)    apixaban (ELIQUIS) 5 MG TABS tablet Take 1 tablet (5 mg total) by mouth 2 (two) times daily.   Ascorbic Acid (VITAMIN C) 1000 MG tablet Take 1,000 mg by mouth daily.   docusate sodium (COLACE) 100 MG capsule Take 100 mg by mouth 2 (two) times daily.    dofetilide (TIKOSYN) 500 MCG capsule Take 1 capsule (500 mcg total) by mouth 2 (two) times daily.   GLUCOSAMINE SULFATE PO Take 2 tablets by mouth daily.    hydrocortisone (ANUSOL-HC) 2.5 % rectal cream Place 1 Application rectally 2 (two) times daily.   methocarbamol (ROBAXIN) 500 MG tablet Take 1 tablet (500 mg total) by mouth 2 (two) times daily.   metoprolol tartrate (LOPRESSOR) 25 MG tablet Take 0.5 tablets (12.5 mg total) by mouth 2 (two) times daily.   Multiple Vitamin (MULTIVITAMIN) tablet Take 1 tablet by mouth daily.   Multiple Vitamins-Minerals (PRESERVISION AREDS 2 PO) Take 1 capsule by mouth 2 (two) times daily.    potassium chloride SA (KLOR-CON M) 20 MEQ tablet TAKE 2 TABLETS TWICE DAILY   pravastatin (PRAVACHOL) 20 MG tablet TAKE 1 TABLET EVERY DAY   sildenafil (VIAGRA) 100 MG tablet Take 100 mg by mouth daily as needed for erectile dysfunction.   No facility-administered encounter medications on file as of 11/05/2022.    Allergies (verified) Patient has  no known allergies.   History: Past Medical History:  Diagnosis Date   Arthritis    "hips" (11/20/2014)   Benign essential HTN    Fasting hyperglycemia 2006   FBS 111; normal A1c   Hepatitis ~ 1955   "yellow juandice"   Hyperlipidemia    Internal hemorrhoids    Persistent atrial fibrillation (HCC) 08/24/2014   CHASD2VASC score is 3 - on Apixaban   Sleep apnea    intolerant to CPAP and now using oral device followed by Dr. 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   Dr Despina Hick   TOTAL HIP ARTHROPLASTY Right 05/17/2018   Procedure: TOTAL HIP ARTHROPLASTY ANTERIOR APPROACH;  Surgeon: Ollen Gross, MD;  Location: [facility] ORS;  Service: Orthopedics;  Laterality: Right;   UVULOPALATOPHARYNGOPLASTY  1997   Dr.Woliki   Family History  Problem Relation Age of Onset   Hypertension Mother    Congestive Heart Failure Mother    Prostate cancer Father    Hypertension Father    Melanoma Sister    Lupus Sister    Prostate cancer Brother        mets to liver   Liver cancer Brother    Atrial fibrillation Brother    Melanoma Brother    Diabetes Neg Hx    Heart disease Neg Hx    Stroke Neg Hx    Colon cancer Neg Hx    Stomach cancer Neg Hx    Social History   Socioeconomic History   Marital status: Married    Spouse name: Not on file   Number of children: 3   Years of education: Not on file   Highest education level: Not on file  Occupational History   Occupation: retired   Tobacco Use   Smoking status: Former    Current packs/day: 0.00    Average packs/day: 1 pack/day for 7.0 years (7.0 ttl pk-yrs)    Types: Cigarettes    Start date: 107    Quit date: 1968    Years since quitting: 56.5   Smokeless tobacco: Never   Tobacco comments:    smoked cigarettes age [age], up to 1ppd  Vaping Use   Vaping status: Never Used  Substance and Sexual Activity   Alcohol use: Yes    Alcohol/week: 2.0 standard drinks of alcohol    Types: 1 Glasses of wine, 1 Cans of beer per week    Comment: less than 1 drink per week, sometimes none   Drug use: No   Sexual activity: Yes  Other Topics Concern   Not on file  Social History Narrative   Right Handed   1-2 Cups of Coffee per Day   1 Soda per Day   Social  Determinants of Health   Financial Resource Strain: Low Risk  (11/05/2022)   Overall Financial Resource Strain (CARDIA)    Difficulty of Paying Living Expenses: Not hard at all  Food Insecurity: No Food Insecurity (11/05/2022)   Hunger Vital Sign  Worried About Programme researcher, broadcasting/film/video in the Last Year: Never true    Ran Out of Food in the Last Year: Never true  Transportation Needs: No Transportation Needs (11/05/2022)   PRAPARE - Administrator, Civil Service (Medical): No    Lack of Transportation (Non-Medical): No  Physical Activity: Insufficiently Active (11/05/2022)   Exercise Vital Sign    Days of Exercise per Week: 1 day    Minutes of Exercise per Session: 30 min  Stress: No Stress Concern Present (11/05/2022)   [employer] of Occupational Health - Occupational Stress Questionnaire    Feeling of Stress : Not at all  Social Connections: Socially Integrated (11/05/2022)   Social Connection and Isolation Panel [NHANES]    Frequency of Communication with Friends and Family: More than three times a week    Frequency of Social Gatherings with Friends and Family: Three times a week    Attends Religious Services: More than 4 times per year    Active Member of Clubs or Organizations: Yes    Attends Engineer, structural: More than 4 times per year    Marital Status: Married    Tobacco Counseling Counseling given: Not Answered Tobacco comments: smoked cigarettes age [age], up to 1ppd   Clinical Intake:  Pre-visit preparation completed: Yes  Pain : No/denies pain     BMI - recorded: 28.14 Nutritional Status: BMI 25 -29 Overweight Nutritional Risks: None Diabetes: No  How often do you need to have someone help you when you read instructions, pamphlets, or other written materials from your doctor or pharmacy?: 1 - Never What is the last grade level you completed in school?: BA  Interpreter Needed?: No      Activities of Daily Living    11/05/2022     4:09 PM  In your present state of health, do you have any difficulty performing the following activities:  Hearing? 0  Vision? 0  Difficulty concentrating or making decisions? 0  Walking or climbing stairs? 0  Dressing or bathing? 0  Doing errands, shopping? 0  Preparing Food and eating ? N  Using the Toilet? N  In the past six months, have you accidently leaked urine? N  Do you have problems with loss of bowel control? N  Managing your Medications? N  Managing your Finances? N  Housekeeping or managing your Housekeeping? N    Patient Care Team: Pincus Sanes, MD as PCP - General (Internal Medicine) Ollen Gross, MD as Consulting Physician (Orthopedic Surgery) Reesa Chew, CMA as Technician Christia Reading, MD as Consulting Physician (Otolaryngology) Meryl Dare, MD as Consulting Physician (Gastroenterology) Hillis Range, MD (Inactive) as Consulting Physician (Cardiology) Kathyrn Sheriff, [facility] (Inactive) as Pharmacist (Pharmacist) Marcille Buffy as Consulting Physician (Optometry)  Indicate any recent Medical Services you may have received from other than [facility] providers in the past year (date may be approximate).     Assessment:   This is a routine wellness examination for Daniel Tucker.  Hearing/Vision screen No results found.  Dietary issues and exercise activities discussed:     Goals Addressed               This Visit's Progress     Patient Stated (pt-stated)        To be able to adjust to him moving to an Assistant living       Depression Screen    11/05/2022    4:38 PM 08/29/2022    1:16 PM 12/24/2021  3:32 PM 03/25/2021    1:50 PM 01/11/2020   11:38 AM 05/18/2019   10:42 AM 05/11/2018    7:47 AM  PHQ 2/9 Scores  PHQ - 2 Score 0 0 0 0 0 0 0  PHQ- 9 Score  0 0        Fall Risk    11/05/2022    4:29 PM 08/29/2022    1:16 PM 12/24/2021    4:39 PM 12/24/2021    3:31 PM 03/25/2021    1:59 PM  Fall Risk   Falls in the past year? 0 0 0 0 0   Number falls in past yr: 0 0 0 0 0  Injury with Fall? 0 0 0 0 0  Risk for fall due to : No Fall Risks No Fall Risks No Fall Risks No Fall Risks No Fall Risks  Follow up Falls evaluation completed Falls evaluation completed Falls evaluation completed Falls evaluation completed     MEDICARE RISK AT HOME:  Medicare Risk at Home - 11/05/22 1639     Any stairs in or around the home? Yes             TIMED UP AND GO:  Was the test performed?  No    Cognitive Function:        11/05/2022    4:38 PM 01/11/2020   11:45 AM  6CIT Screen  What Year? 0 points 0 points  What month? 0 points 0 points  What time? 0 points 0 points  Count back from 20 0 points 0 points  Months in reverse 0 points 0 points  Repeat phrase 0 points 0 points  Total Score 0 points 0 points    Immunizations Immunization History  Administered Date(s) Administered   Fluad Quad(high Dose 65+) 01/22/2021   Influenza Split 03/18/2011   Influenza Whole 01/14/2010   Influenza, High Dose Seasonal PF 02/15/2013, 01/26/2014, 02/16/2015, 01/16/2016, 01/01/2017, 01/03/2019, 12/30/2019   Influenza,inj,quad, With Preservative 01/13/2018   PFIZER(Purple Top)SARS-COV-2 Vaccination 05/27/2019, 06/22/2019, 01/25/2020   Pfizer Covid-19 Vaccine Bivalent Booster 97yrs & up 01/22/2021   Pneumococcal Conjugate-13 08/24/2014   Pneumococcal Polysaccharide-23 02/04/2010   Td 11/08/2009   Tdap 02/25/2019   Zoster Recombinant(Shingrix) 01/13/2018, 03/25/2018    TDAP status: Up to date  Flu Vaccine status: Up to date  Pneumococcal vaccine status: Up to date  Covid-19 vaccine status: Completed vaccines  Qualifies for Shingles Vaccine? Yes   Zostavax completed Yes   Shingrix Completed?: Yes  Screening Tests Health Maintenance  Topic Date Due   COVID-19 Vaccine (5 - 2023-24 season) 12/20/2021   INFLUENZA VACCINE  11/20/2022   Medicare Annual Wellness (AWV)  11/05/2023   DTaP/Tdap/Td (3 - Td or Tdap) 02/24/2029    Pneumonia Vaccine 72+ Years old  Completed   Zoster Vaccines- Shingrix  Completed   HPV VACCINES  Aged Out   Colonoscopy  Discontinued   Hepatitis C Screening  Discontinued    Health Maintenance  Health Maintenance Due  Topic Date Due   COVID-19 Vaccine (5 - 2023-24 season) 12/20/2021    Colorectal cancer screening: Type of screening: Colonoscopy. Completed 04/02/2021. Repeat every N/A years  Lung Cancer Screening: (Low Dose CT Chest recommended if Age 23-80 years, 20 pack-year currently smoking OR have quit w/in 15years.) does not qualify.   Lung Cancer Screening Referral: N/A  Additional Screening:  Hepatitis C Screening: does qualify; Completed 01/13/2018  Vision Screening: Recommended annual ophthalmology exams for early detection of glaucoma and other disorders of the  eye. Is the patient up to date with their annual eye exam?  Yes  Who is the provider or what is the name of the office in which the patient attends annual eye exams? N/A If pt is not established with a provider, would they like to be referred to a provider to establish care? No .   Dental Screening: Recommended annual dental exams for proper oral hygiene   Community Resource Referral / Chronic Care Management: CRR required this visit?  No   CCM required this visit?  No     Plan:     I have personally reviewed and noted the following in the patient's chart:   Medical and social history Use of alcohol, tobacco or illicit drugs  Current medications and supplements including opioid prescriptions. Patient is not currently taking opioid prescriptions. Functional ability and status Nutritional status Physical activity Advanced directives List of other physicians Hospitalizations, surgeries, and ER visits in previous 12 months Vitals Screenings to include cognitive, depression, and falls Referrals and appointments  In addition, I have reviewed and discussed with patient certain preventive protocols,  quality metrics, and best practice recommendations. A written personalized care plan for preventive services as well as general preventive health recommendations were provided to patient.     Daniel Tucker, CMA   11/05/2022   After Visit Summary: (Declined) Due to this being a telephonic visit, with patients personalized plan was offered to patient but patient Declined AVS at this time   Nurse Notes:  Daniel Tucker , Thank you for taking time to come for your Medicare Wellness Visit. I appreciate your ongoing commitment to your health goals. Please review the following plan we discussed and let me know if I can assist you in the future.   These are the goals we discussed:  Goals       Continue with my home projects, enjoy life, family, and church      Manage My Medicine      Timeframe:  Long-Range Goal Priority:  Medium Start Date:      09/04/20                       Expected End Date:  05/02/2022                    Follow Up Date 10/30/2021   - call for medicine refill 2 or 3 days before it runs out - call if I am sick and can't take my medicine - keep a list of all the medicines I take; vitamins and herbals too - use a pillbox to sort medicine    Why is this important?   These steps will help you keep on track with your medicines.   Notes:       Patient Stated      I want to continue to work on my inside and outside projects which keeps me active. 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       Patient Stated      To maintain my current health status by continuing to eat healthy, stay physically active and socially active.      Patient Stated (pt-stated)      To maintain my current health status by continuing to eat healthy, stay physically active and socially active.      Patient Stated (pt-stated)      To be able to adjust to him moving  to an Assistant living         This is a list of the screening recommended for you and due dates:  Health  Maintenance  Topic Date Due   COVID-19 Vaccine (5 - 2023-24 season) 12/20/2021   Flu Shot  11/20/2022   Medicare Annual Wellness Visit  11/05/2023   DTaP/Tdap/Td vaccine (3 - Td or Tdap) 02/24/2029   Pneumonia Vaccine  Completed   Zoster (Shingles) Vaccine  Completed   HPV Vaccine  Aged Out   Colon Cancer Screening  Discontinued   Hepatitis C Screening  Discontinued     Medical screening examination/treatment/procedure(s) were performed by non-physician practitioner and as supervising physician I was immediately available for consultation/collaboration.  I agree with above. Jacinta Shoe, MD

## 2022-11-16 DIAGNOSIS — Z23 Encounter for immunization: Secondary | ICD-10-CM | POA: Diagnosis not present

## 2022-12-02 ENCOUNTER — Telehealth: Payer: Self-pay | Admitting: Family Medicine

## 2022-12-02 DIAGNOSIS — G4730 Sleep apnea, unspecified: Secondary | ICD-10-CM

## 2022-12-02 NOTE — Telephone Encounter (Signed)
AHI now 12/hr on new settings of 18/12. Previously 9/hr on 17/11. We will adjust to 16/11 and repeat download in 6 weeks.

## 2022-12-02 NOTE — Telephone Encounter (Signed)
Can I get a copy of the most recent 90 day compliance review? TY!

## 2022-12-02 NOTE — Addendum Note (Signed)
Addended by: Shawnie Dapper L on: 12/02/2022 03:44 PM   Modules accepted: Orders

## 2022-12-02 NOTE — Telephone Encounter (Signed)
Printed off

## 2022-12-05 ENCOUNTER — Emergency Department (HOSPITAL_COMMUNITY)
Admission: EM | Admit: 2022-12-05 | Discharge: 2022-12-05 | Disposition: A | Discharge status: Home / Self Care | Payer: Medicare Other | Attending: Emergency Medicine | Admitting: Emergency Medicine

## 2022-12-05 ENCOUNTER — Emergency Department (HOSPITAL_COMMUNITY): Payer: Medicare Other

## 2022-12-05 ENCOUNTER — Other Ambulatory Visit: Payer: Self-pay

## 2022-12-05 DIAGNOSIS — Y92003 Bedroom of unspecified non-institutional (private) residence as the place of occurrence of the external cause: Secondary | ICD-10-CM | POA: Insufficient documentation

## 2022-12-05 DIAGNOSIS — I4510 Unspecified right bundle-branch block: Secondary | ICD-10-CM | POA: Diagnosis not present

## 2022-12-05 DIAGNOSIS — Z7901 Long term (current) use of anticoagulants: Secondary | ICD-10-CM | POA: Insufficient documentation

## 2022-12-05 DIAGNOSIS — S299XXA Unspecified injury of thorax, initial encounter: Secondary | ICD-10-CM | POA: Diagnosis present

## 2022-12-05 DIAGNOSIS — I10 Essential (primary) hypertension: Secondary | ICD-10-CM | POA: Diagnosis not present

## 2022-12-05 DIAGNOSIS — I6782 Cerebral ischemia: Secondary | ICD-10-CM | POA: Diagnosis not present

## 2022-12-05 DIAGNOSIS — R0689 Other abnormalities of breathing: Secondary | ICD-10-CM | POA: Diagnosis not present

## 2022-12-05 DIAGNOSIS — S20211A Contusion of right front wall of thorax, initial encounter: Secondary | ICD-10-CM | POA: Insufficient documentation

## 2022-12-05 DIAGNOSIS — I4891 Unspecified atrial fibrillation: Secondary | ICD-10-CM | POA: Diagnosis not present

## 2022-12-05 DIAGNOSIS — R0781 Pleurodynia: Secondary | ICD-10-CM | POA: Diagnosis not present

## 2022-12-05 DIAGNOSIS — S0990XA Unspecified injury of head, initial encounter: Secondary | ICD-10-CM | POA: Diagnosis not present

## 2022-12-05 DIAGNOSIS — W06XXXA Fall from bed, initial encounter: Secondary | ICD-10-CM | POA: Diagnosis not present

## 2022-12-05 DIAGNOSIS — Z79899 Other long term (current) drug therapy: Secondary | ICD-10-CM | POA: Insufficient documentation

## 2022-12-05 DIAGNOSIS — W19XXXA Unspecified fall, initial encounter: Secondary | ICD-10-CM | POA: Diagnosis not present

## 2022-12-05 DIAGNOSIS — S0083XA Contusion of other part of head, initial encounter: Secondary | ICD-10-CM | POA: Insufficient documentation

## 2022-12-05 MED ORDER — METHOCARBAMOL 500 MG PO TABS: 500.0000 mg | ORAL_TABLET | Freq: Two times a day (BID) | ORAL | 0 refills | Status: DC

## 2022-12-05 NOTE — Progress Notes (Signed)
Orthopedic Tech Progress Note Patient Details:  Daniel Tucker October 29, 1940 [id number]  Patient ID: Daniel Tucker, male   DOB: Apr 29, 1940, 82 y.o.   MRN: [id number] Level 2 trauma, not needed. Al Decant 12/05/2022, 6:38 AM

## 2022-12-05 NOTE — ED Triage Notes (Signed)
Pt from home via ems for fall out of bed hitting rt side of head on bedside nightstand. On eliquis.arrived w/ c-collar in place. A and o x 4 skin warm and dry. Afib baseline. C/o muscle spasms rt side upperback/neck.

## 2022-12-05 NOTE — Discharge Instructions (Addendum)
Thank you for coming to [facility]. You were seen for fall, head trauma. We did an exam, and imaging, and these showed no acute findings. We have refilled your Robaxin.  You can also take 1000 mg of Tylenol every 8 hours for pain.  Please follow up with your primary care provider within 1 week.   Do not hesitate to return to the ED or call 911 if you experience: -Worsening symptoms -Lightheadedness, passing out -Fevers/chills -Anything else that concerns you

## 2022-12-05 NOTE — ED Provider Notes (Signed)
8:40 AM Assumed care of patient from off-going team. For more details, please see note from same day.  In brief, this is a 82 y.o. male on eliquis who rolled out of bed, hit head on nightstand. Otherwise in his NSOH.  Plan/Dispo at time of sign-out & ED Course since sign-out: [ ]  CTH, R ribs  BP 139/81   Pulse 70   Temp 98.1 F (36.7 C) (Oral)   Resp 14   Ht 5\' 7"  (1.702 m)   Wt 83.9 kg   SpO2 99%   BMI 28.98 kg/m    ED Course:   Clinical Course as of 12/05/22 0840  Fri Dec 05, 2022  1914 CT HEAD WO CONTRAST ( ) No evidence of intracranial injury.  Chronic small vessel ischemia.   [HN]  [id number] DG Ribs Unilateral W/Chest Right Negative. [HN]  [phone] Reevaluated patient who states that he except he is having a muscle spasm in his right side that he has had for weeks and has managed at home with Robaxin which helps him significantly.  However he is out of his Robaxin at home.  I discussed that it is possible that he may have a occult rib fracture that is not noted on the x-ray but patient states that this is the exact same muscle spasm he has had for weeks.  Will refill his Robaxin at home and instruct him to follow-up with his PCP.  Patient and wife report understanding.  All questions answered to their satisfaction. [HN]    Clinical Course User Index [HN] Loetta Rough, MD    Dispo: DC w/ discharge instructions/return precautions. All questions answered to patient's satisfaction.   ------------------------------- Vivi Barrack, MD Emergency Medicine  This note was created using dictation software, which may contain spelling or grammatical errors.   Loetta Rough, MD 12/05/22 [phone]

## 2022-12-05 NOTE — ED Provider Notes (Signed)
[place] EMERGENCY DEPARTMENT AT [facility] Provider Note   CSN: [id number] Arrival date & time: 12/05/22  5102     History  Chief Complaint  Patient presents with   Fall    On thinners    Daniel Tucker is a 82 y.o. male.  To the emergency department for evaluation after a fall.  Patient reports that he fell out of bed and struck his head on the nightstand next to his bed.  He does take Eliquis secondary to atrial fibrillation.  No loss of consciousness.  Patient complains of slight headache.  No neck or midline back pain.  He reports some pain in the right ribs which has been chronic since an injury several months ago but thinks he might of aggravated it again.       Home Medications Prior to Admission medications   Medication Sig Start Date End Date Taking? Authorizing Provider  acetaminophen (TYLENOL) 500 MG tablet Take 500 mg by mouth every 6 (six) hours as needed for moderate pain or headache.     [provider]  acyclovir (ZOVIRAX) 200 MG capsule TAKE 2 CAPSULES EVERY DAY 01/08/22   Pincus Sanes, MD  amLODipine (NORVASC) 10 MG tablet TAKE 1 TABLET EVERY DAY (DOSE CHANGE) 02/20/22   Newman Nip, NP  apixaban (ELIQUIS) 5 MG TABS tablet Take 1 tablet (5 mg total) by mouth 2 (two) times daily. 07/25/22   Fenton, Clint R, PA  Ascorbic Acid (VITAMIN C) 1000 MG tablet Take 1,000 mg by mouth daily.    [provider]  docusate sodium (COLACE) 100 MG capsule Take 100 mg by mouth 2 (two) times daily.     [provider]  dofetilide (TIKOSYN) 500 MCG capsule Take 1 capsule (500 mcg total) by mouth 2 (two) times daily. 09/09/22   Eustace Pen, PA-C  GLUCOSAMINE SULFATE PO Take 2 tablets by mouth daily.     [provider]  hydrocortisone (ANUSOL-HC) 2.5 % rectal cream Place 1 Application rectally 2 (two) times daily. 01/28/22   Zehr, Princella Pellegrini, PA-C  methocarbamol (ROBAXIN) 500 MG tablet Take 1 tablet (500 mg total) by mouth 2  (two) times daily. 08/25/22   Margarita Grizzle, MD  metoprolol tartrate (LOPRESSOR) 25 MG tablet Take 0.5 tablets (12.5 mg total) by mouth 2 (two) times daily. 08/26/22   Eustace Pen, PA-C  Multiple Vitamin (MULTIVITAMIN) tablet Take 1 tablet by mouth daily.    [provider]  Multiple Vitamins-Minerals (PRESERVISION AREDS 2 PO) Take 1 capsule by mouth 2 (two) times daily.     [provider]  potassium chloride SA (KLOR-CON M) 20 MEQ tablet TAKE 2 TABLETS TWICE DAILY 04/16/22   Newman Nip, NP  pravastatin (PRAVACHOL) 20 MG tablet TAKE 1 TABLET EVERY DAY 01/08/22   Pincus Sanes, MD  sildenafil (VIAGRA) 100 MG tablet Take 100 mg by mouth daily as needed for erectile dysfunction.    Bjorn Pippin, MD      Allergies    Patient has no known allergies.    Review of Systems   Review of Systems  Physical Exam Updated Vital Signs There were no vitals taken for this visit. Physical Exam Vitals and nursing note reviewed.  Constitutional:      General: He is not in acute distress.    Appearance: He is well-developed.  HENT:     Head: Normocephalic. Abrasion and contusion present.     Comments: Small abrasion with underlying  contusion right forehead    Mouth/Throat:     Mouth: Mucous membranes are moist.  Eyes:     General: Vision grossly intact. Gaze aligned appropriately.     Extraocular Movements: Extraocular movements intact.     Conjunctiva/sclera: Conjunctivae normal.  Cardiovascular:     Rate and Rhythm: Normal rate and regular rhythm.     Pulses: Normal pulses.     Heart sounds: Normal heart sounds, S1 normal and S2 normal. No murmur heard.    No friction rub. No gallop.  Pulmonary:     Effort: Pulmonary effort is normal. No respiratory distress.     Breath sounds: Normal breath sounds.  Chest:     Chest wall: Tenderness present. No deformity or swelling.    Abdominal:     Palpations: Abdomen is soft.     Tenderness: There is no abdominal tenderness.  There is no guarding or rebound.     Hernia: No hernia is present.  Musculoskeletal:        General: No swelling.     Cervical back: Full passive range of motion without pain, normal range of motion and neck supple. No pain with movement, spinous process tenderness or muscular tenderness. Normal range of motion.     Right lower leg: No edema.     Left lower leg: No edema.  Skin:    General: Skin is warm and dry.     Capillary Refill: Capillary refill takes less than 2 seconds.     Findings: No ecchymosis, erythema, lesion or wound.  Neurological:     Mental Status: He is alert and oriented to person, place, and time.     GCS: GCS eye subscore is 4. GCS verbal subscore is 5. GCS motor subscore is 6.     Cranial Nerves: Cranial nerves 2-12 are intact.     Sensory: Sensation is intact.     Motor: Motor function is intact. No weakness or abnormal muscle tone.     Coordination: Coordination is intact.  Psychiatric:        Mood and Affect: Mood normal.        Speech: Speech normal.        Behavior: Behavior normal.     ED Results / Procedures / Treatments   Labs (all labs ordered are listed, but only abnormal results are displayed) Labs Reviewed - No data to display  EKG None  Radiology No results found.  Procedures Procedures    Medications Ordered in ED Medications - No data to display  ED Course/ Medical Decision Making/ A&P                                 Medical Decision Making Amount and/or Complexity of Data Reviewed External Data Reviewed: radiology and notes. Radiology: ordered.   Differential diagnosis considered includes, but not limited to: Blunt trauma including intracranial injury, spinal injury, thoracic injury, intra-abdominal and retroperitoneal injury, orthopedic injury  Patient brought to the emergency department as a level 2 trauma secondary to fall on thinners.  Patient is awake, alert and oriented.  He has a contusion on the right forehead with  a superficial abrasion.  Normal neurologic exam.  Will obtain CT head.  Cervical spine examination is normal.  C-spine cleared by Nexus criteria.  Midline thoracic and lumbar spine exam normal.  Patient without any evidence of lower extremity injury.  Moving both hips without difficulty, no pain or  deformity.  Patient with some complaints of right-sided rib pain and tenderness on exam.  No deformity.  Lungs clear.  He reports that he has had some chronic chest wall pain after an injury a couple of months ago but that it may be aggravated.  Will obtain chest with ribs x-ray.  Will sign out to oncoming ER physician.  If imaging negative, patient can be discharged.        Final Clinical Impression(s) / ED Diagnoses Final diagnoses:  Contusion of other part of head, initial encounter  Rib contusion, right, initial encounter    Rx / DC Orders ED Discharge Orders     None         Chavy Avera, Canary Brim, MD 12/05/22 [phone]

## 2022-12-10 ENCOUNTER — Telehealth: Payer: Self-pay | Admitting: *Deleted

## 2022-12-10 ENCOUNTER — Encounter: Payer: Self-pay | Admitting: *Deleted

## 2022-12-10 NOTE — Transitions of Care (Post Inpatient/ED Visit) (Signed)
   12/10/2022  Name: Daniel Tucker MRN: [id number] DOB: 08-Apr-1941  Today's TOC FU Call Status: Today's TOC FU Call Status:: Unsuccessful Call (1st Attempt) Unsuccessful Call (1st Attempt) Date: 12/10/22  ED EMMI Red Alert notification on 12/08/22 from ED visit 12/05/22- automated EMMI call placed 07/07/22: "No scheduled follow up"  Attempted to reach the patient regarding the most recent ED visit; left HIPAA compliant voice message requesting call back  Follow Up Plan: Additional outreach attempts will be made to reach the patient to complete the Transitions of Care (ED visit) call.   Caryl Pina, RN, BSN, CCRN Alumnus RN CM Care Coordination/ Transition of Care- [facility] Care Management [phone]: direct office

## 2022-12-11 ENCOUNTER — Telehealth: Payer: Self-pay | Admitting: *Deleted

## 2022-12-11 ENCOUNTER — Encounter: Payer: Self-pay | Admitting: *Deleted

## 2022-12-11 NOTE — Transitions of Care (Post Inpatient/ED Visit) (Signed)
   12/11/2022  Name: DILLAN TOURVILLE MRN: [id number] DOB: 1940/06/08  Today's TOC FU Call Status: Today's TOC FU Call Status:: Unsuccessful Call (2nd Attempt) Unsuccessful Call (2nd Attempt) Date: 12/11/22  ED EMMI Red Alert notification on 12/08/22 from ED visit 12/05/22- automated EMMI call placed 07/07/22: "No scheduled follow up"   Attempted to reach the patient regarding the most recent ED visit.  Follow Up Plan: Additional outreach attempts will be made to reach the patient to complete the Transitions of Care (Post ED visit) call.   Caryl Pina, RN, BSN, CCRN Alumnus RN CM Care Coordination/ Transition of Care- [facility] Care Management [phone]: direct office

## 2022-12-12 ENCOUNTER — Encounter: Payer: Self-pay | Admitting: *Deleted

## 2022-12-12 ENCOUNTER — Telehealth: Payer: Self-pay | Admitting: *Deleted

## 2022-12-12 NOTE — Transitions of Care (Post Inpatient/ED Visit) (Signed)
   12/12/2022  Name: Daniel Tucker MRN: [id number] DOB: [birth year]  Today's TOC FU Call Status: Today's TOC FU Call Status:: Unsuccessful Call (3rd Attempt) Unsuccessful Call (3rd Attempt) Date: 12/12/22  Attempted to reach the patient regarding the most recent Inpatient visit; left HIPAA compliant voice message requesting call back  Follow Up Plan: No further outreach attempts will be made at this time. We have been unable to contact the patient.  Caryl Pina, RN, BSN, CCRN Alumnus RN CM Care Coordination/ Transition of Care- [facility] Care Management [phone]: direct office

## 2023-01-01 DIAGNOSIS — Z23 Encounter for immunization: Secondary | ICD-10-CM | POA: Diagnosis not present

## 2023-01-14 ENCOUNTER — Ambulatory Visit (INDEPENDENT_AMBULATORY_CARE_PROVIDER_SITE_OTHER): Payer: Medicare Other | Admitting: Neurology

## 2023-01-14 ENCOUNTER — Other Ambulatory Visit: Payer: Self-pay | Admitting: Family Medicine

## 2023-01-14 ENCOUNTER — Telehealth: Payer: Self-pay | Admitting: Family Medicine

## 2023-01-14 ENCOUNTER — Encounter: Payer: Self-pay | Admitting: Neurology

## 2023-01-14 VITALS — BP 136/80 | HR 76 | Ht 67.0 in | Wt 185.0 lb

## 2023-01-14 DIAGNOSIS — G4730 Sleep apnea, unspecified: Secondary | ICD-10-CM

## 2023-01-14 DIAGNOSIS — G4731 Primary central sleep apnea: Secondary | ICD-10-CM

## 2023-01-14 DIAGNOSIS — Z9889 Other specified postprocedural states: Secondary | ICD-10-CM | POA: Diagnosis not present

## 2023-01-14 DIAGNOSIS — I480 Paroxysmal atrial fibrillation: Secondary | ICD-10-CM | POA: Diagnosis not present

## 2023-01-14 DIAGNOSIS — G4752 REM sleep behavior disorder: Secondary | ICD-10-CM | POA: Insufficient documentation

## 2023-01-14 NOTE — Telephone Encounter (Signed)
-----   Message from Hellon Vaccarella sent at 12/02/2022  3:44 PM EDT ----- Recheck AHI with new settings of 16/11, AHI now 12 on 18/12, previously 9/hr on 17/11

## 2023-01-14 NOTE — Telephone Encounter (Signed)
Recheck AHI with new settings of 16/11, AHI now 12 on 18/12, previously 9/hr on 17/11   Can you please attach 90 day report? TY!

## 2023-01-14 NOTE — Addendum Note (Signed)
Addended by: Melvyn Novas on: 01/14/2023 03:48 PM   Modules accepted: Orders
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
## 2023-01-15 LAB — ATN PROFILE

## 2023-01-16 NOTE — Telephone Encounter (Signed)
Called patient and LVM to give [place] a call back or check MyChart.

## 2023-01-28 ENCOUNTER — Encounter: Payer: Self-pay | Admitting: Neurology

## 2023-01-28 NOTE — Progress Notes (Signed)
Central sleep apnea patient : atrial fib and REM BD, CSA, Overall AHI was too high at 15.6 /h and half of these events were central apneas. Excellent compliance. This download has been reviewed in a visit at [facility] and doesn't need resulting .  The patient will undergo in lab PAP titration on BiPAP and  if needed ST, ASV

## 2023-02-10 ENCOUNTER — Other Ambulatory Visit (HOSPITAL_COMMUNITY): Payer: Self-pay | Admitting: *Deleted

## 2023-02-10 ENCOUNTER — Telehealth: Payer: Self-pay | Admitting: Neurology

## 2023-02-10 MED ORDER — POTASSIUM CHLORIDE CRYS ER 20 MEQ PO TBCR: EXTENDED_RELEASE_TABLET | ORAL | 3 refills | Status: DC

## 2023-02-10 NOTE — Telephone Encounter (Signed)
BIPAP- Medicare/aarp no auth req    Patient  scheduled at [facility] for 03/30/23 at 9 pm.  Mailed packet to the patient.

## 2023-02-19 ENCOUNTER — Telehealth: Payer: Self-pay | Admitting: Family Medicine

## 2023-02-19 NOTE — Telephone Encounter (Signed)
Can you please attach a 90 day download for review? TY!

## 2023-02-23 NOTE — Telephone Encounter (Signed)
He was seen by Dr Vickey Huger 12/2022. Scheduled for BiPAP titration 03/2023 as AHI remains elevated despite pressure adjustments.

## 2023-02-25 ENCOUNTER — Other Ambulatory Visit (HOSPITAL_COMMUNITY): Payer: Self-pay | Admitting: Internal Medicine

## 2023-02-25 DIAGNOSIS — I480 Paroxysmal atrial fibrillation: Secondary | ICD-10-CM

## 2023-03-30 ENCOUNTER — Ambulatory Visit (INDEPENDENT_AMBULATORY_CARE_PROVIDER_SITE_OTHER): Payer: Medicare Other | Admitting: Neurology

## 2023-03-30 ENCOUNTER — Other Ambulatory Visit (HOSPITAL_COMMUNITY): Payer: Self-pay | Admitting: *Deleted

## 2023-03-30 DIAGNOSIS — Z9889 Other specified postprocedural states: Secondary | ICD-10-CM

## 2023-03-30 DIAGNOSIS — G4752 REM sleep behavior disorder: Secondary | ICD-10-CM

## 2023-03-30 DIAGNOSIS — I480 Paroxysmal atrial fibrillation: Secondary | ICD-10-CM

## 2023-03-30 DIAGNOSIS — G4739 Other sleep apnea: Secondary | ICD-10-CM

## 2023-03-30 DIAGNOSIS — Z23 Encounter for immunization: Secondary | ICD-10-CM | POA: Diagnosis not present

## 2023-03-30 DIAGNOSIS — G4730 Sleep apnea, unspecified: Secondary | ICD-10-CM

## 2023-03-30 MED ORDER — AMLODIPINE BESYLATE 10 MG PO TABS: 10.0000 mg | ORAL_TABLET | Freq: Every day | ORAL | 3 refills | Status: DC

## 2023-04-08 ENCOUNTER — Other Ambulatory Visit: Payer: Self-pay | Admitting: Neurology

## 2023-04-08 DIAGNOSIS — G4739 Other sleep apnea: Secondary | ICD-10-CM

## 2023-04-08 DIAGNOSIS — I480 Paroxysmal atrial fibrillation: Secondary | ICD-10-CM

## 2023-04-08 DIAGNOSIS — G4752 REM sleep behavior disorder: Secondary | ICD-10-CM

## 2023-04-08 DIAGNOSIS — G4730 Sleep apnea, unspecified: Secondary | ICD-10-CM
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
## 2023-04-08 NOTE — Addendum Note (Signed)
Addended by: Melvyn Novas on: 04/08/2023 12:56 PM   Modules accepted: Orders

## 2023-04-08 NOTE — Progress Notes (Signed)
Start BiPAP ST with new mask as described , ST 12 BiPAP 20/ 16, avoiding supine sleep.   REM sleep behavior is present.

## 2023-04-14 ENCOUNTER — Telehealth: Payer: Self-pay

## 2023-04-14 NOTE — Telephone Encounter (Signed)
I called pt. I advised pt that Dr. Vickey Huger reviewed their bipap titration study results and found that pt should begin BiPAP. I reviewed BiPAP compliance expectations with the pt. Pt is agreeable to starting BiPAP. I advised pt that an order will be sent to a DME, [facility], and they will call the pt within about one week after they file with the pt's insurance. [facility] will show the pt how to use the machine, fit for masks, and troubleshoot, if needed. A follow up appt was made for insurance purposes with Shawnie Dapper, NP on 07/20/23. Patient is aware to call back to reschedule if appointment falls outside of the 30-90 day time frame. Pt verbalized understanding of results. Pt had no questions at this time but was encouraged to call back if questions arise. I have sent the order to [facility].

## 2023-04-14 NOTE — Telephone Encounter (Signed)
-----   Message from Lebanon Dohmeier sent at 04/08/2023 12:56 PM EST ----- Start BiPAP ST with new mask as described , ST 12 BiPAP 20/ 16, avoiding supine sleep.   REM sleep behavior is present.

## 2023-04-27 ENCOUNTER — Other Ambulatory Visit (HOSPITAL_COMMUNITY): Payer: Self-pay | Admitting: Internal Medicine

## 2023-04-27 DIAGNOSIS — R351 Nocturia: Secondary | ICD-10-CM | POA: Diagnosis not present

## 2023-04-27 DIAGNOSIS — N5201 Erectile dysfunction due to arterial insufficiency: Secondary | ICD-10-CM | POA: Diagnosis not present

## 2023-04-27 DIAGNOSIS — N403 Nodular prostate with lower urinary tract symptoms: Secondary | ICD-10-CM | POA: Diagnosis not present

## 2023-04-30 ENCOUNTER — Ambulatory Visit (HOSPITAL_COMMUNITY)
Admission: RE | Admit: 2023-04-30 | Discharge: 2023-04-30 | Disposition: A | Discharge status: Home / Self Care | Payer: Medicare Other | Source: Ambulatory Visit | Attending: Internal Medicine | Admitting: Internal Medicine

## 2023-04-30 ENCOUNTER — Telehealth: Payer: Self-pay | Admitting: Internal Medicine

## 2023-04-30 ENCOUNTER — Other Ambulatory Visit: Payer: Self-pay

## 2023-04-30 ENCOUNTER — Other Ambulatory Visit: Payer: Self-pay | Admitting: Internal Medicine

## 2023-04-30 VITALS — BP 118/64 | HR 60 | Ht 67.0 in | Wt 193.6 lb

## 2023-04-30 DIAGNOSIS — Z79899 Other long term (current) drug therapy: Secondary | ICD-10-CM | POA: Insufficient documentation

## 2023-04-30 DIAGNOSIS — Z5181 Encounter for therapeutic drug level monitoring: Secondary | ICD-10-CM | POA: Diagnosis not present

## 2023-04-30 DIAGNOSIS — G4733 Obstructive sleep apnea (adult) (pediatric): Secondary | ICD-10-CM | POA: Diagnosis not present

## 2023-04-30 DIAGNOSIS — I480 Paroxysmal atrial fibrillation: Secondary | ICD-10-CM

## 2023-04-30 DIAGNOSIS — D6869 Other thrombophilia: Secondary | ICD-10-CM

## 2023-04-30 DIAGNOSIS — Z7901 Long term (current) use of anticoagulants: Secondary | ICD-10-CM | POA: Insufficient documentation

## 2023-04-30 DIAGNOSIS — I4819 Other persistent atrial fibrillation: Secondary | ICD-10-CM | POA: Insufficient documentation

## 2023-04-30 DIAGNOSIS — I4891 Unspecified atrial fibrillation: Secondary | ICD-10-CM | POA: Diagnosis not present

## 2023-04-30 DIAGNOSIS — I10 Essential (primary) hypertension: Secondary | ICD-10-CM | POA: Diagnosis not present

## 2023-04-30 LAB — BASIC METABOLIC PANEL
Anion gap: 7 (ref 5–15)
BUN: 19 mg/dL (ref 8–23)
CO2: 26 mmol/L (ref 22–32)
Calcium: 9.1 mg/dL (ref 8.9–10.3)
Chloride: 104 mmol/L (ref 98–111)
Creatinine, Ser: 1.15 mg/dL (ref 0.61–1.24)
GFR, Estimated: >60 mL/min (ref >60)
Glucose, Bld: 111 mg/dL — ABNORMAL HIGH (ref 70–99)
Potassium: 4.3 mmol/L (ref 3.5–5.1)
Sodium: 137 mmol/L (ref 135–145)

## 2023-04-30 LAB — MAGNESIUM: Magnesium: 2.3 mg/dL (ref 1.7–2.4)

## 2023-04-30 MED ORDER — ACYCLOVIR 200 MG PO CAPS: ORAL_CAPSULE | ORAL | 1 refills | Status: DC

## 2023-04-30 MED ORDER — PRAVASTATIN SODIUM 20 MG PO TABS: 20.0000 mg | ORAL_TABLET | Freq: Every day | ORAL | 1 refills | Status: DC

## 2023-04-30 NOTE — Telephone Encounter (Signed)
Faxed in today for patient 

## 2023-04-30 NOTE — Telephone Encounter (Signed)
 Copied from CRM [id number]. Topic: Clinical - Medication Refill >> Apr 30, 2023 11:00 AM Corin V wrote: Most Recent Primary Care Visit:  Provider: LEAR, GRACE P  Department: [facility]  Visit Type: MEDICARE AWV, SEQUENTIAL  Date: 11/05/2022  Medication: pravastatin  (PRAVACHOL ) 20 MG tablet acyclovir  (ZOVIRAX ) 200 MG capsule  Has the patient contacted their pharmacy? Yes (Agent: If no, request that the patient contact the pharmacy for the refill. If patient does not wish to contact the pharmacy document the reason why and proceed with request.) (Agent: If yes, when and what did the pharmacy advise?)  Is this the correct pharmacy for this prescription? Yes If no, delete pharmacy and type the correct one.  This is the patient's preferred pharmacy:  [facility] - [place], [place] - [address] [place] [address] Phone: [phone] Fax: [phone]   Has the prescription been filled recently? No  Is the patient out of the medication? Yes  Has the patient been seen for an appointment in the last year OR does the patient have an upcoming appointment? No  Can we respond through MyChart? No  Agent: Please be advised that Rx refills may take up to 3 business days. We ask that you follow-up with your pharmacy.

## 2023-04-30 NOTE — Progress Notes (Signed)
 Patient ID: Daniel Tucker, male   DOB: 1940-09-29, 83 y.o.   MRN: [id number]     Primary Care Physician: Daniel Glade PARAS, MD Referring Physician: Dr. Kelsie Carlin LELON Tucker is a 83 y.o. male with a h/o paroxysmal afib maintaining SR on tikosyn . He is in the afib clinic for f/u surveillance of Tikosyn . He did go thru a hip replacement in February 2020 without any issues. Short episode  afib lst week that responded well to extra dose of BB. Pending 2nd covid shot 3/3. Qtc is stable.   F/u in the afib clinic, 8/26, for surveillance ofTikosyn. He forgot his Tikosyn  a couple of weeks ago and did have breakthrough afib.He did convert the next day. No further  afib.   F/u in afib clinic 10/03/20. He states that he has been staying in SR. Rare episode of afib that resolves with extra metoprolol . He has lost some weight as he got a new puppy and is walking outside with it frequently. No issues with anticoagulation.   F/u in afib clinic, 12/19, no afib to report. His BP's at home are typically running 140-150 systolic. BP is  150/82 bpm . Discussed increasing amlodipine  to 10 mg daily to better control BP. He is compliant with meds.   F/u afib clinic, 10/09/21 for tikosyn  surveillance. He is in SR, one episode of  afib to report. Took extra BB and was back in SR in 1-2 hours. Otherwise, no concerns. Qt stable with dofetilide .  F/u in afib clinic, 04/23/22 for Tikosyn  surveillance. He reports that he is doing well. No afib noted. No change in health. No issue with anticoagulation.  F/u in Afib clinic, 10/28/22 for Tikosyn  surveillance. He is currently in NSR. No episodes of Afib to report. Patient takes Tikosyn  500 mcg BID. No missed doses of Tikosyn  or Eliquis .   F/u in Afib clinic, 04/30/23 for Tikosyn  surveillance. He is currently in NSR. No episodes of Afib since last office visit. He has completed his move to [facility] and is almost completely settled. He is taking Tikosyn  500 mcg BID. No missed  doses of Tikosyn  or Eliquis .  Today, he denies symptoms of palpitations, chest pain, shortness of breath, orthopnea, PND, lower extremity edema, dizziness, presyncope, syncope, or neurologic sequela. The patient is tolerating medications without difficulties and is otherwise without complaint today.   Past Medical History:  Diagnosis Date   Arthritis    hips (11/20/2014)   Benign essential HTN    Fasting hyperglycemia 2006   FBS 111; normal A1c   Hepatitis ~ 1955   yellow juandice   Hyperlipidemia    Internal hemorrhoids    Persistent atrial fibrillation (HCC) 08/24/2014   CHASD2VASC score is 3 - on Apixaban    Sleep apnea    intolerant to CPAP and now using oral device followed by Dr. Melba   Tubular adenoma of colon 2006   Dr Aneita   Past Surgical History:  Procedure Laterality Date   CARDIOVERSION N/A 10/12/2014   Procedure: CARDIOVERSION;  Surgeon: Debby Gallop, MD;  Location: [facility];  Service: Cardiovascular;  Laterality: N/A;   COLONOSCOPY  2011   no polyp   COLONOSCOPY W/ POLYPECTOMY  2006   [facility]   NASAL SINUS SURGERY  1997   PROSTATE BIOPSY  2010   Negative,Dr.Ottelin   TONSILLECTOMY AND ADENOIDECTOMY  1997   TOTAL HIP ARTHROPLASTY Left 08/2010   Dr Hiram   TOTAL HIP ARTHROPLASTY Right 05/17/2018   Procedure: TOTAL HIP ARTHROPLASTY  ANTERIOR APPROACH;  Surgeon: Melodi Lerner, MD;  Location: [facility];  Service: Orthopedics;  Laterality: Right;   UVULOPALATOPHARYNGOPLASTY  1997   Dr.Woliki    Current Outpatient Medications  Medication Sig Dispense Refill   acetaminophen  (TYLENOL ) 500 MG tablet Take 500 mg by mouth as needed for moderate pain (pain score 4-6) or headache.     acyclovir  (ZOVIRAX ) 200 MG capsule TAKE 2 CAPSULES EVERY DAY 180 capsule 1   amLODipine  (NORVASC ) 10 MG tablet Take 1 tablet (10 mg total) by mouth daily. 90 tablet 3   apixaban  (ELIQUIS ) 5 MG TABS tablet Take 1 tablet (5 mg total) by mouth 2 (two) times daily. 180 tablet 3    Ascorbic Acid (VITAMIN C) 1000 MG tablet Take 1,000 mg by mouth daily.     docusate sodium  (COLACE) 100 MG capsule Take 100 mg by mouth 2 (two) times daily.      dofetilide  (TIKOSYN ) 500 MCG capsule TAKE 1 CAPSULE TWICE DAILY 180 capsule 2   GLUCOSAMINE SULFATE PO Take 2 tablets by mouth daily.      hydrocortisone  (ANUSOL -HC) 2.5 % rectal cream Place 1 Application rectally 2 (two) times daily. (Patient taking differently: Place 1 Application rectally as needed.) 30 g 1   Melatonin 3 MG CAPS Take 1 capsule by mouth at bedtime.     methocarbamol  (ROBAXIN ) 500 MG tablet Take 1 tablet (500 mg total) by mouth 2 (two) times daily. 20 tablet 0   metoprolol  tartrate (LOPRESSOR ) 25 MG tablet TAKE 1/2 TABLET TWICE DAILY 90 tablet 3   Multiple Vitamin (MULTIVITAMIN) tablet Take 1 tablet by mouth daily.     Multiple Vitamins-Minerals (PRESERVISION AREDS 2 PO) Take 1 capsule by mouth 2 (two) times daily.      potassium chloride  SA (KLOR-CON  M) 20 MEQ tablet TAKE 2 TABLETS TWICE DAILY 360 tablet 3   pravastatin  (PRAVACHOL ) 20 MG tablet TAKE 1 TABLET EVERY DAY 90 tablet 1   sildenafil (VIAGRA) 100 MG tablet Take 100 mg by mouth daily as needed for erectile dysfunction.     No current facility-administered medications for this encounter.    No Known Allergies  ROS- All systems are reviewed and negative except as per the HPI above  Physical Exam: Vitals:   04/30/23 1108  BP: 118/64  Pulse: 60  Weight: 87.8 kg  Height: 5' 7 (1.702 m)    GEN- The patient is well appearing, alert and oriented x 3 today.   Neck - no JVD or carotid bruit noted Lungs- Clear to ausculation bilaterally, normal work of breathing Heart- Regular rate and rhythm, no murmurs, rubs or gallops, PMI not laterally displaced Extremities- no clubbing, cyanosis, or edema Skin - no rash or ecchymosis noted   EKG- Vent. rate 60 BPM PR interval 162 ms QRS duration 140 ms QT/QTcB 458/458 ms P-R-T axes 49 -45 -21 Normal sinus  rhythm Right bundle branch block Left anterior fascicular block Bifascicular block Minimal voltage criteria for LVH, may be normal variant ( R in aVL ) Abnormal ECG When compared with ECG of 28-Oct-2022 11:02, PREVIOUS ECG IS PRESENT  ECHO 09/11/14: Study Conclusions   - Left ventricle: The cavity size was mildly dilated. Wall    thickness was normal. Systolic function was normal. The estimated    ejection fraction was in the range of 55% to 60%. Wall motion was    normal; there were no regional wall motion abnormalities.  - Mitral valve: There was mild regurgitation.  - Left atrium: The atrium  was moderately to severely dilated.  - Right atrium: The atrium was moderately dilated.  - Pulmonary arteries: Systolic pressure was mildly increased. PA    peak pressure: 42 mm Hg (S).   Assessment and Plan: 1. Afib  He is currently in NSR.  Qtc stable. Continue on tikosyn  500 mcg bid and metoprolol  12.5 mg bid  Continue apixaban  5 mg bid, no bleeding issues Bmet/mag today.  2.OSA He is compliant with therapy.  3. HTN  Stable  No changes at this time.   F/u in 6 months for Tikosyn  surveillance.    [name], PA-C [facility] [address] [place], [place] [address] [phone]

## 2023-04-30 NOTE — Telephone Encounter (Signed)
 Copied from CRM [id number]. Topic: Clinical - Medication Refill >> Apr 30, 2023 11:00 AM Corin V wrote: Most Recent Primary Care Visit:  Provider: LEAR, GRACE P  Department: [facility]  Visit Type: MEDICARE AWV, SEQUENTIAL  Date: 11/05/2022  Medication: pravastatin  (PRAVACHOL ) 20 MG tablet  Has the patient contacted their pharmacy? Yes (Agent: If no, request that the patient contact the pharmacy for the refill. If patient does not wish to contact the pharmacy document the reason why and proceed with request.) (Agent: If yes, when and what did the pharmacy advise?)  Is this the correct pharmacy for this prescription? Yes If no, delete pharmacy and type the correct one.  This is the patient's preferred pharmacy:  [facility] - [place], [place] - [address] [place] [address] Phone: [phone] Fax: [phone]   Has the prescription been filled recently?   Is the patient out of the medication?   Has the patient been seen for an appointment in the last year OR does the patient have an upcoming appointment?   Can we respond through MyChart?   Agent: Please be advised that Rx refills may take up to 3 business days. We ask that you follow-up with your pharmacy.

## 2023-06-15 ENCOUNTER — Other Ambulatory Visit (HOSPITAL_COMMUNITY): Payer: Self-pay | Admitting: Physician Assistant
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
## 2023-07-14 NOTE — Patient Instructions (Addendum)
 Please continue using your BiPAP regularly. While your insurance requires that you use BiPAP at least 4 hours each night on 70% of the nights, I recommend, that you not skip any nights and use it throughout the night if you can. Getting used to BiPAP and staying with the treatment long term does take time and patience and discipline. Untreated obstructive sleep apnea when it is moderate to severe can have an adverse impact on cardiovascular health and raise her risk for heart disease, arrhythmias, hypertension, congestive heart failure, stroke and diabetes. Untreated obstructive sleep apnea causes sleep disruption, nonrestorative sleep, and sleep deprivation. This can have an impact on your day to day functioning and cause daytime sleepiness and impairment of cognitive function, memory loss, mood disturbance, and problems focussing. Using BiPAP regularly can improve these symptoms.  We will update supply orders, today. Monitor your air leak at home. Make sure you are changing mask every 30 days. Continue regular physical and mental activity. Continue melatonin at bedtime. Keep an eye on your blood pressure.   Follow up in 1 year    Management of Memory Problems   There are some general things you can do to help manage your memory problems.  Your memory may not in fact recover, but by using techniques and strategies you will be able to manage your memory difficulties better.   1)  Establish a routine. Try to establish and then stick to a regular routine.  By doing this, you will get used to what to expect and you will reduce the need to rely on your memory.  Also, try to do things at the same time of day, such as taking your medication or checking your calendar first thing in the morning. Think about think that you can do as a part of a regular routine and make a list.  Then enter them into a daily planner to remind you.  This will help you establish a routine.   2)  Organize your environment. Organize  your environment so that it is uncluttered.  Decrease visual stimulation.  Place everyday items such as keys or cell phone in the same place every day (ie.  Basket next to front door) Use post it notes with a brief message to yourself (ie. Turn off light, lock the door) Use labels to indicate where things go (ie. Which cupboards are for food, dishes, etc.) Keep a notepad and pen by the telephone to take messages   3)  Memory Aids A diary or journal/notebook/daily planner Making a list (shopping list, chore list, to do list that needs to be done) Using an alarm as a reminder (kitchen timer or cell phone alarm) Using cell phone to store information (Notes, Calendar, Reminders) Calendar/White board placed in a prominent position Post-it notes   In order for memory aids to be useful, you need to have good habits.  It's no good remembering to make a note in your journal if you don't remember to look in it.  Try setting aside a certain time of day to look in journal.   4)  Improving mood and managing fatigue. There may be other factors that contribute to memory difficulties.  Factors, such as anxiety, depression and tiredness can affect memory. Regular gentle exercise can help improve your mood and give you more energy. Exercise: there are short videos created by the [employer] on Health specially for older adults: https://bit.ly/2I30q97.  Mediterranean diet: which emphasizes fruits, vegetables, whole grains, legumes, fish, and other seafood;  unsaturated fats such as olive oils; and low amounts of red meat, eggs, and sweets. A variation of this, called MIND (Mediterranean-DASH Intervention for Neurodegenerative Delay) incorporates the DASH (Dietary Approaches to Stop Hypertension) diet, which has been shown to lower high blood pressure, a risk factor for Alzheimer's disease. More information at: ExitMarketing.de.   Aerobic exercise that improve heart health is also good for the mind.  [employer] on Aging have short videos for exercises that you can do at home: BlindWorkshop.com.pt Simple relaxation techniques may help relieve symptoms of anxiety Try to get back to completing activities or hobbies you enjoyed doing in the past. Learn to pace yourself through activities to decrease fatigue. Find out about some local support groups where you can share experiences with others. Try and achieve 7-8 hours of sleep at night.   Tasks to improve attention/working memory 1. Good sleep hygiene (7-8 hrs of sleep) 2. Learning a new skill (Painting, Carpentry, Pottery, new language, Knitting). 3.Cognitive exercises (keep a daily journal, Puzzles) 4. Physical exercise and training  (30 min/day X 4 days week) 5. Being on Antidepressant if needed 6.Yoga, Meditation, Tai Chi 7. Decrease alcohol intake 8.Have a clear schedule and structure in daily routine   MIND Diet: The Mediterranean-DASH Diet Intervention for Neurodegenerative Delay, or MIND diet, targets the health of the aging brain. Research participants with the highest MIND diet scores had a significantly slower rate of cognitive decline compared with those with the lowest scores. The effects of the MIND diet on cognition showed greater effects than either the Mediterranean or the DASH diet alone.   The healthy items the MIND diet guidelines suggest include:   3+ servings a day of whole grains 1+ servings a day of vegetables (other than green leafy) 6+ servings a week of green leafy vegetables 5+ servings a week of nuts 4+ meals a week of beans 2+ servings a week of berries 2+ meals a week of poultry 1+ meals a week of fish Mainly olive oil if added fat is used   The unhealthy items, which are higher in saturated and trans fat, include: Less than 5 servings a week of pastries and sweets Less than 4 servings a week of red meat (including  beef, pork, lamb, and products made from these meats) Less than one serving a week of cheese and fried foods Less than 1 tablespoon a day of butter/stick margarine

## 2023-07-16 NOTE — Progress Notes (Signed)
 Daniel Tucker

## 2023-07-20 ENCOUNTER — Encounter: Payer: Self-pay | Admitting: Family Medicine

## 2023-07-20 ENCOUNTER — Ambulatory Visit (INDEPENDENT_AMBULATORY_CARE_PROVIDER_SITE_OTHER): Payer: PRIVATE HEALTH INSURANCE | Admitting: Family Medicine

## 2023-07-20 VITALS — BP 152/76 | HR 69 | Ht 67.0 in | Wt 195.5 lb

## 2023-07-20 DIAGNOSIS — G4739 Other sleep apnea: Secondary | ICD-10-CM | POA: Diagnosis not present

## 2023-07-20 DIAGNOSIS — G4752 REM sleep behavior disorder: Secondary | ICD-10-CM

## 2023-07-20 DIAGNOSIS — G4761 Periodic limb movement disorder: Secondary | ICD-10-CM | POA: Diagnosis not present

## 2023-07-20 DIAGNOSIS — G4730 Sleep apnea, unspecified: Secondary | ICD-10-CM | POA: Diagnosis not present

## 2023-09-30 DIAGNOSIS — H2513 Age-related nuclear cataract, bilateral: Secondary | ICD-10-CM | POA: Diagnosis not present

## 2023-10-14 ENCOUNTER — Other Ambulatory Visit: Payer: Self-pay | Admitting: Internal Medicine

## 2023-10-29 ENCOUNTER — Ambulatory Visit (HOSPITAL_COMMUNITY)
Admission: RE | Admit: 2023-10-29 | Discharge: 2023-10-29 | Disposition: A | Discharge status: Home / Self Care | Payer: Medicare Other | Source: Ambulatory Visit | Attending: Internal Medicine | Admitting: Internal Medicine

## 2023-10-29 VITALS — BP 142/70 | HR 51 | Ht 67.0 in | Wt 192.6 lb

## 2023-10-29 DIAGNOSIS — Z79899 Other long term (current) drug therapy: Secondary | ICD-10-CM | POA: Insufficient documentation

## 2023-10-29 DIAGNOSIS — I4891 Unspecified atrial fibrillation: Secondary | ICD-10-CM | POA: Diagnosis not present

## 2023-10-29 DIAGNOSIS — I480 Paroxysmal atrial fibrillation: Secondary | ICD-10-CM | POA: Diagnosis not present

## 2023-10-29 DIAGNOSIS — Z5181 Encounter for therapeutic drug level monitoring: Secondary | ICD-10-CM | POA: Insufficient documentation

## 2023-10-29 DIAGNOSIS — D6869 Other thrombophilia: Secondary | ICD-10-CM | POA: Insufficient documentation

## 2023-10-29 NOTE — Progress Notes (Signed)
 Patient ID: Daniel Tucker, Tucker   DOB: Apr 12, 1941, 83 y.o.   MRN: [id number]     Primary Care Physician: Geofm Glade PARAS, MD Referring Physician: Dr. Kelsie Carlin LELON Daniel Tucker with a h/o paroxysmal afib maintaining SR on tikosyn . He is in the afib clinic for f/u surveillance of Tikosyn . He did go thru a hip replacement in February 2020 without any issues. Short episode  afib lst week that responded well to extra dose of BB. Pending 2nd covid shot 3/3. Qtc is stable.   F/u in the afib clinic, 8/26, for surveillance ofTikosyn. He forgot his Tikosyn  a couple of weeks ago and did have breakthrough afib.He did convert the next day. No further  afib.   F/u in afib clinic 10/03/20. He states that he has been staying in SR. Rare episode of afib that resolves with extra metoprolol . He has lost some weight as he got a new puppy and is walking outside with it frequently. No issues with anticoagulation.   F/u in afib clinic, 12/19, no afib to report. His BP's at home are typically running 140-150 systolic. BP is  150/82 bpm . Discussed increasing amlodipine  to 10 mg daily to better control BP. He is compliant with meds.   F/u afib clinic, 10/09/21 for tikosyn  surveillance. He is in SR, one episode of  afib to report. Took extra BB and was back in SR in 1-2 hours. Otherwise, no concerns. Qt stable with dofetilide .  F/u in afib clinic, 04/23/22 for Tikosyn  surveillance. He reports that he is doing well. No afib noted. No change in health. No issue with anticoagulation.  F/u in Afib clinic, 10/28/22 for Tikosyn  surveillance. He is currently in NSR. No episodes of Afib to report. Patient takes Tikosyn  500 mcg BID. No missed doses of Tikosyn  or Eliquis .   F/u in Afib clinic, 04/30/23 for Tikosyn  surveillance. He is currently in NSR. No episodes of Afib since last office visit. He has completed his move to [facility] and is almost completely settled. He is taking Tikosyn  500 mcg BID. No missed  doses of Tikosyn  or Eliquis .  Follow up in Afib clinic for Tikosyn  surveillance, 10/29/23. He is currently in NSR. He has had no episodes of Afib since last office visit. He is doing well overall. His wife is a patient here being admitted for Tikosyn  next week. No missed doses of Tikosyn  or Eliquis .   Today, he denies symptoms of palpitations, chest pain, shortness of breath, orthopnea, PND, lower extremity edema, dizziness, presyncope, syncope, or neurologic sequela. The patient is tolerating medications without difficulties and is otherwise without complaint today.   Past Medical History:  Diagnosis Date   Arthritis    hips (11/20/2014)   Benign essential HTN    Fasting hyperglycemia 2006   FBS 111; normal A1c   Hepatitis ~ 1955   yellow juandice   Hyperlipidemia    Internal hemorrhoids    Persistent atrial fibrillation (HCC) 08/24/2014   CHASD2VASC score is 3 - on Apixaban    Sleep apnea    intolerant to CPAP and now using oral device followed by Dr. Melba   Tubular adenoma of colon 2006   Dr Aneita   Past Surgical History:  Procedure Laterality Date   CARDIOVERSION N/A 10/12/2014   Procedure: CARDIOVERSION;  Surgeon: Debby Gallop, MD;  Location: [facility] ENDOSCOPY;  Service: Cardiovascular;  Laterality: N/A;   COLONOSCOPY  2011   no polyp   COLONOSCOPY W/ POLYPECTOMY  2006   [facility]   NASAL SINUS SURGERY  1997   PROSTATE BIOPSY  2010   Negative,Dr.Ottelin   TONSILLECTOMY AND ADENOIDECTOMY  1997   TOTAL HIP ARTHROPLASTY Left 08/2010   Dr Hiram   TOTAL HIP ARTHROPLASTY Right 05/17/2018   Procedure: TOTAL HIP ARTHROPLASTY ANTERIOR APPROACH;  Surgeon: Melodi Lerner, MD;  Location: [facility] ORS;  Service: Orthopedics;  Laterality: Right;   UVULOPALATOPHARYNGOPLASTY  1997   Dr.Woliki    Current Outpatient Medications  Medication Sig Dispense Refill   acetaminophen  (TYLENOL ) 500 MG tablet Take 500 mg by mouth as needed for moderate pain (pain score 4-6) or headache.     acyclovir   (ZOVIRAX ) 200 MG capsule TAKE 2 CAPSULES EVERY DAY 60 capsule 0   amLODipine  (NORVASC ) 10 MG tablet Take 1 tablet (10 mg total) by mouth daily. 90 tablet 3   Ascorbic Acid (VITAMIN C) 1000 MG tablet Take 1,000 mg by mouth daily.     docusate sodium  (COLACE) 100 MG capsule Take 100 mg by mouth 2 (two) times daily.      dofetilide  (TIKOSYN ) 500 MCG capsule TAKE 1 CAPSULE TWICE DAILY 180 capsule 2   ELIQUIS  5 MG TABS tablet TAKE 1 TABLET TWICE DAILY 180 tablet 3   GLUCOSAMINE SULFATE PO Take 2 tablets by mouth daily.      Melatonin 3 MG CAPS Take 1 capsule by mouth at bedtime.     metoprolol  tartrate (LOPRESSOR ) 25 MG tablet TAKE 1/2 TABLET TWICE DAILY 90 tablet 3   Multiple Vitamin (MULTIVITAMIN) tablet Take 1 tablet by mouth daily.     Multiple Vitamins-Minerals (PRESERVISION AREDS 2 PO) Take 1 capsule by mouth 2 (two) times daily.      potassium chloride  SA (KLOR-CON  M) 20 MEQ tablet TAKE 2 TABLETS TWICE DAILY 360 tablet 3   pravastatin  (PRAVACHOL ) 20 MG tablet TAKE 1 TABLET EVERY DAY 30 tablet 0   sildenafil (VIAGRA) 100 MG tablet Take 100 mg by mouth daily as needed for erectile dysfunction. (Patient taking differently: Take 100 mg by mouth as needed for erectile dysfunction.)     No current facility-administered medications for this encounter.    No Known Allergies  ROS- All systems are reviewed and negative except as per the HPI above  Physical Exam: Vitals:   10/29/23 1138  BP: (!) 142/70  Pulse: (!) 51  Weight: 87.4 kg  Height: 5' 7 (1.702 m)    GEN- The patient is well appearing, alert and oriented x 3 today.   Neck - no JVD or carotid bruit noted Lungs- Clear to ausculation bilaterally, normal work of breathing Heart- Regular bradycardic rate and rhythm, no murmurs, rubs or gallops, PMI not laterally displaced Extremities- no clubbing, cyanosis, or edema Skin - no rash or ecchymosis noted   EKG- Vent. rate 51 BPM PR interval 162 ms QRS duration 136 ms QT/QTcB  476/438 ms P-R-T axes 50 -44 -31 Sinus bradycardia with sinus arrhythmia Left axis deviation Right bundle branch block Minimal voltage criteria for LVH, may be normal variant ( R in aVL ) Abnormal ECG When compared with ECG of 30-Apr-2023 11:14, No significant change was found  ECHO 09/11/14: Study Conclusions   - Left ventricle: The cavity size was mildly dilated. Wall    thickness was normal. Systolic function was normal. The estimated    ejection fraction was in the range of 55% to 60%. Wall motion was    normal; there were no regional wall motion abnormalities.  - Mitral valve: There  was mild regurgitation.  - Left atrium: The atrium was moderately to severely dilated.  - Right atrium: The atrium was moderately dilated.  - Pulmonary arteries: Systolic pressure was mildly increased. PA    peak pressure: 42 mm Hg (S).   Assessment and Plan: 1. Afib  He is currently in NSR.  High risk medication monitoring (ICD10: [id number]) Patient requires ongoing monitoring for anti-arrhythmic medication which has the potential to cause life threatening arrhythmias or AV block. Qtc stable. Continue Tikosyn  500 mcg BID. Bmet and mag drawn today.  2.OSA He is compliant with CPAP therapy.  3. HTN  Stable no changes at this time.    Follow up in 6 months for Tikosyn  surveillance.    [name], PA-C Afib Clinic [facility] [address] [place], [place] [address] [phone]

## 2023-10-30 ENCOUNTER — Ambulatory Visit (HOSPITAL_COMMUNITY): Payer: Self-pay | Admitting: Internal Medicine

## 2023-10-30 LAB — BASIC METABOLIC PANEL WITH GFR
BUN/Creatinine Ratio: 14 (ref 10–24)
BUN: 16 mg/dL (ref 8–27)
CO2: 21 mmol/L (ref 20–29)
Calcium: 9.2 mg/dL (ref 8.6–10.2)
Chloride: 101 mmol/L (ref 96–106)
Creatinine, Ser: 1.11 mg/dL (ref 0.76–1.27)
Glucose: 125 mg/dL — ABNORMAL HIGH (ref 70–99)
Potassium: 4.6 mmol/L (ref 3.5–5.2)
Sodium: 139 mmol/L (ref 134–144)
eGFR: 66 mL/min/1.73 (ref >59)

## 2023-10-30 LAB — MAGNESIUM: Magnesium: 2.2 mg/dL (ref 1.6–2.3)

## 2023-11-13 ENCOUNTER — Other Ambulatory Visit: Payer: Self-pay | Admitting: Internal Medicine

## 2023-11-17 ENCOUNTER — Encounter: Payer: Self-pay | Admitting: Internal Medicine

## 2023-11-17 NOTE — Patient Instructions (Addendum)
      Blood work was ordered.       Medications changes include :   None    A referral was ordered and someone will call you to schedule an appointment.     Return in about 1 year (around 11/17/2024) for follow up.

## 2023-11-17 NOTE — Progress Notes (Unsigned)
 Subjective:    Patient ID: Daniel Tucker, male    DOB: 1940-07-28, 83 y.o.   MRN: [id number]     HPI Caston is here for follow up of his chronic medical problems.  Overall he is doing well.  This morning got nauseous - had to sit down and it went away.  He did not eat.  His symptoms resolved-he has not had this in the past that was different.  He denies any GERD.  He is trying to stay active - not exercising regularly.    Medications and allergies reviewed with patient and updated if appropriate.  Current Outpatient Medications on File Prior to Visit  Medication Sig Dispense Refill   acetaminophen  (TYLENOL ) 500 MG tablet Take 500 mg by mouth as needed for moderate pain (pain score 4-6) or headache.     acyclovir  (ZOVIRAX ) 200 MG capsule TAKE 2 CAPSULES EVERY DAY 60 capsule 0   amLODipine  (NORVASC ) 10 MG tablet Take 1 tablet (10 mg total) by mouth daily. 90 tablet 3   Ascorbic Acid (VITAMIN C) 1000 MG tablet Take 1,000 mg by mouth daily.     docusate sodium  (COLACE) 100 MG capsule Take 100 mg by mouth 2 (two) times daily.      dofetilide  (TIKOSYN ) 500 MCG capsule TAKE 1 CAPSULE TWICE DAILY 180 capsule 2   ELIQUIS  5 MG TABS tablet TAKE 1 TABLET TWICE DAILY 180 tablet 3   GLUCOSAMINE SULFATE PO Take 2 tablets by mouth daily.      Melatonin 3 MG CAPS Take 1 capsule by mouth at bedtime.     metoprolol  tartrate (LOPRESSOR ) 25 MG tablet TAKE 1/2 TABLET TWICE DAILY 90 tablet 3   Multiple Vitamin (MULTIVITAMIN) tablet Take 1 tablet by mouth daily.     Multiple Vitamins-Minerals (PRESERVISION AREDS 2 PO) Take 1 capsule by mouth 2 (two) times daily.      potassium chloride  SA (KLOR-CON  M) 20 MEQ tablet TAKE 2 TABLETS TWICE DAILY 360 tablet 3   pravastatin  (PRAVACHOL ) 20 MG tablet TAKE 1 TABLET EVERY DAY 30 tablet 0   sildenafil (VIAGRA) 100 MG tablet Take 100 mg by mouth daily as needed for erectile dysfunction. (Patient taking differently: Take 100 mg by mouth as needed for  erectile dysfunction.)     No current facility-administered medications on file prior to visit.     Review of Systems  Constitutional:  Negative for fever.  Respiratory:  Negative for cough, shortness of breath and wheezing.   Cardiovascular:  Negative for chest pain, palpitations and leg swelling.  Gastrointestinal:  Negative for abdominal pain, blood in stool, constipation and diarrhea.       No gerd  Neurological:  Negative for light-headedness and headaches.  Psychiatric/Behavioral:  Negative for dysphoric mood. The patient is not nervous/anxious.        Objective:   Vitals:   11/18/23 1006  BP: 126/80  Pulse: 60  Temp: 98 F (36.7 C)  SpO2: 98%   BP Readings from Last 3 Encounters:  11/18/23 126/80  10/29/23 (!) 142/70  07/20/23 (!) 152/76   Wt Readings from Last 3 Encounters:  11/18/23 191 lb (86.6 kg)  10/29/23 192 lb 9.6 oz (87.4 kg)  07/20/23 195 lb 8 oz (88.7 kg)   Body mass index is 29.91 kg/m.    Physical Exam Constitutional:      General: He is not in acute distress.    Appearance: Normal appearance. He is not ill-appearing.  HENT:  Head: Normocephalic and atraumatic.  Eyes:     Conjunctiva/sclera: Conjunctivae normal.  Cardiovascular:     Rate and Rhythm: Normal rate and regular rhythm.     Heart sounds: Normal heart sounds.  Pulmonary:     Effort: Pulmonary effort is normal. No respiratory distress.     Breath sounds: Normal breath sounds. No wheezing or rales.  Abdominal:     General: There is no distension.     Palpations: Abdomen is soft.     Tenderness: There is no abdominal tenderness.  Musculoskeletal:     Right lower leg: No edema.     Left lower leg: No edema.  Skin:    General: Skin is warm and dry.     Findings: No rash.  Neurological:     Mental Status: He is alert. Mental status is at baseline.  Psychiatric:        Mood and Affect: Mood normal.        Lab Results  Component Value Date   WBC 11.6 (H) 08/24/2022    HGB 13.9 08/24/2022   HCT 40.7 08/24/2022   PLT 216 08/24/2022   GLUCOSE 125 (H) 10/29/2023   CHOL 154 01/11/2020   TRIG 130 01/11/2020   HDL 45 01/11/2020   LDLCALC 86 01/11/2020   ALT 16 01/11/2020   AST 20 01/11/2020   NA 139 10/29/2023   K 4.6 10/29/2023   CL 101 10/29/2023   CREATININE 1.11 10/29/2023   BUN 16 10/29/2023   CO2 21 10/29/2023   TSH 1.31 01/11/2020   PSA 0.43 09/05/2015   INR 1.13 05/12/2018   HGBA1C 5.8 (H) 01/11/2020     Assessment & Plan:    See Problem List for Assessment and Plan of chronic medical problems.

## 2023-11-18 ENCOUNTER — Ambulatory Visit (INDEPENDENT_AMBULATORY_CARE_PROVIDER_SITE_OTHER): Admitting: Internal Medicine

## 2023-11-18 VITALS — BP 126/80 | HR 60 | Temp 98.0°F | Ht 67.0 in | Wt 191.0 lb

## 2023-11-18 DIAGNOSIS — I4891 Unspecified atrial fibrillation: Secondary | ICD-10-CM

## 2023-11-18 DIAGNOSIS — R7303 Prediabetes: Secondary | ICD-10-CM

## 2023-11-18 DIAGNOSIS — E782 Mixed hyperlipidemia: Secondary | ICD-10-CM | POA: Diagnosis not present

## 2023-11-18 DIAGNOSIS — R110 Nausea: Secondary | ICD-10-CM | POA: Diagnosis not present

## 2023-11-18 DIAGNOSIS — G4730 Sleep apnea, unspecified: Secondary | ICD-10-CM | POA: Diagnosis not present

## 2023-11-18 DIAGNOSIS — I10 Essential (primary) hypertension: Secondary | ICD-10-CM | POA: Diagnosis not present

## 2023-11-18 LAB — LIPID PANEL
Cholesterol: 148 mg/dL (ref 0–200)
HDL: 49.3 mg/dL (ref >39.00)
LDL Cholesterol: 78 mg/dL (ref 0–99)
NonHDL: 98.78
Total CHOL/HDL Ratio: 3
Triglycerides: 102 mg/dL (ref 0.0–149.0)
VLDL: 20.4 mg/dL (ref 0.0–40.0)

## 2023-11-18 LAB — COMPREHENSIVE METABOLIC PANEL WITH GFR
ALT: 15 U/L (ref 0–53)
AST: 18 U/L (ref 0–37)
Albumin: 4.5 g/dL (ref 3.5–5.2)
Alkaline Phosphatase: 62 U/L (ref 39–117)
BUN: 15 mg/dL (ref 6–23)
CO2: 29 meq/L (ref 19–32)
Calcium: 9.4 mg/dL (ref 8.4–10.5)
Chloride: 103 meq/L (ref 96–112)
Creatinine, Ser: 1.07 mg/dL (ref 0.40–1.50)
GFR: 64.66 mL/min (ref >60.00)
Glucose, Bld: 111 mg/dL — ABNORMAL HIGH (ref 70–99)
Potassium: 4.1 meq/L (ref 3.5–5.1)
Sodium: 138 meq/L (ref 135–145)
Total Bilirubin: 0.8 mg/dL (ref 0.2–1.2)
Total Protein: 7.3 g/dL (ref 6.0–8.3)

## 2023-11-18 LAB — CBC WITH DIFFERENTIAL/PLATELET
Basophils Absolute: 0 K/uL (ref 0.0–0.1)
Basophils Relative: 0 % (ref 0.0–3.0)
Eosinophils Absolute: 0.2 K/uL (ref 0.0–0.7)
Eosinophils Relative: 2.6 % (ref 0.0–5.0)
HCT: 42.3 % (ref 39.0–52.0)
Hemoglobin: 14.2 g/dL (ref 13.0–17.0)
Lymphocytes Relative: 27.9 % (ref 12.0–46.0)
Lymphs Abs: 1.7 K/uL (ref 0.7–4.0)
MCHC: 33.7 g/dL (ref 30.0–36.0)
MCV: 97.1 fl (ref 78.0–100.0)
Monocytes Absolute: 0.5 K/uL (ref 0.1–1.0)
Monocytes Relative: 8.6 % (ref 3.0–12.0)
Neutro Abs: 3.8 K/uL (ref 1.4–7.7)
Neutrophils Relative %: 60.9 % (ref 43.0–77.0)
Platelets: 192 K/uL (ref 150.0–400.0)
RBC: 4.36 Mil/uL (ref 4.22–5.81)
RDW: 13.2 % (ref 11.5–15.5)
WBC: 6.2 K/uL (ref 4.0–10.5)

## 2023-11-18 LAB — TSH: TSH: 1.44 u[IU]/mL (ref 0.35–5.50)

## 2023-11-18 LAB — HEMOGLOBIN A1C: Hgb A1c MFr Bld: 6.3 % (ref 4.6–6.5)

## 2023-11-18 NOTE — Assessment & Plan Note (Signed)
 Acute Transient episode of nausea this morning before eating No associated symptoms It did seem to passed after little time No concerning symptoms Monitor and if this recurs or continues to happen we will need to evaluate further

## 2023-11-18 NOTE — Assessment & Plan Note (Signed)
 Chronic Using his BIPAP nightly

## 2023-11-18 NOTE — Assessment & Plan Note (Signed)
 Chronic Lab Results  Component Value Date   HGBA1C 5.8 (H) 01/11/2020   Check a1c Low sugar / carb diet Stressed regular exercise

## 2023-11-18 NOTE — Assessment & Plan Note (Addendum)
 Chronic Lab Results  Component Value Date   LDLCALC 86 01/11/2020   Check lipid panel, cmp Continue pravastatin  20 mg daily Depending on LDL will consider increasing the pravastatin  Regular exercise and healthy diet encouraged

## 2023-11-18 NOTE — Assessment & Plan Note (Signed)
 Chronic Following with cardiology On Tikosyn  500 mg twice daily, Eliquis  5 mg twice daily, metoprolol  12.5 mg twice daily CBC, CMP, TSH

## 2023-11-18 NOTE — Assessment & Plan Note (Signed)
 Chronic BP adequately controlled Current regimen effective and well tolerated CMP, CBC Continue amlodipine  10 mg daily, metoprolol  12.5 mg bid

## 2023-11-19 ENCOUNTER — Ambulatory Visit: Payer: Self-pay | Admitting: Internal Medicine

## 2023-12-11 ENCOUNTER — Other Ambulatory Visit: Payer: Self-pay | Admitting: Internal Medicine

## 2023-12-17 ENCOUNTER — Telehealth: Payer: Self-pay

## 2023-12-17 DIAGNOSIS — H2511 Age-related nuclear cataract, right eye: Secondary | ICD-10-CM | POA: Diagnosis not present

## 2023-12-17 DIAGNOSIS — H18413 Arcus senilis, bilateral: Secondary | ICD-10-CM | POA: Diagnosis not present

## 2023-12-17 DIAGNOSIS — H25013 Cortical age-related cataract, bilateral: Secondary | ICD-10-CM | POA: Diagnosis not present

## 2023-12-17 DIAGNOSIS — H25043 Posterior subcapsular polar age-related cataract, bilateral: Secondary | ICD-10-CM | POA: Diagnosis not present

## 2023-12-17 DIAGNOSIS — H2513 Age-related nuclear cataract, bilateral: Secondary | ICD-10-CM | POA: Diagnosis not present

## 2023-12-17 NOTE — Telephone Encounter (Signed)
   Pre-operative Risk Assessment    Patient Name: Daniel Tucker  DOB: 1940-06-12 MRN: [id number]   Date of last office visit: 10/29/23 Daniel HEINRICH, PA-C (AFIB), 02/25/17 Daniel BIHARI, MD Date of next office visit: 03/30/24 Daniel HEINRICH, PA-C (AFIB)   Request for Surgical Clearance    Procedure:  CATARACT EXTRACTION W/ INTRAOCULAR LENS IMPLANTATION OF THE RIGHT EYE FOLLOWED BY THE LEFT EYE  Date of Surgery:  Clearance 03/02/24         &      03/30/24                       Surgeon:  DR EVALENE BEVIS Surgeon's Group or Practice Name:  [facility] Phone number:  [phone] Fax number:  [phone]   Type of Clearance Requested:   - Medical  - Pharmacy:  Hold Apixaban  (Eliquis ) (PER REQUEST PATIENT DOES NOT NEED TO STOP ANY MEDICATION)   Type of Anesthesia:  TOPICAL ANESTHESIA W/ IV MEDICATION   Additional requests/questions:    Signed, Lucie DELENA Ku   12/17/2023, 4:29 PM

## 2023-12-17 NOTE — Telephone Encounter (Signed)
   Patient Name: Daniel Tucker  DOB: 04/20/1941 MRN: [id number]  Primary Cardiologist: None  Chart reviewed as part of pre-operative protocol coverage. Cataract extractions are recognized in guidelines as low risk surgeries that do not typically require specific preoperative testing or holding of blood thinner therapy. Therefore, given past medical history and time since last visit, based on ACC/AHA guidelines, Daniel Tucker would be at acceptable risk for the planned procedure without further cardiovascular testing.   I will route this recommendation to the requesting party via Epic fax function and remove from pre-op  pool.  Please call with questions.  Wyn Raddle, Jackee Shove, NP 12/17/2023, 4:49 PM

## 2023-12-19 DIAGNOSIS — H2511 Age-related nuclear cataract, right eye: Secondary | ICD-10-CM | POA: Diagnosis not present

## 2023-12-19 DIAGNOSIS — H25043 Posterior subcapsular polar age-related cataract, bilateral: Secondary | ICD-10-CM | POA: Diagnosis not present

## 2023-12-19 DIAGNOSIS — H18413 Arcus senilis, bilateral: Secondary | ICD-10-CM | POA: Diagnosis not present

## 2023-12-19 DIAGNOSIS — H2513 Age-related nuclear cataract, bilateral: Secondary | ICD-10-CM | POA: Diagnosis not present

## 2023-12-19 DIAGNOSIS — H25013 Cortical age-related cataract, bilateral: Secondary | ICD-10-CM | POA: Diagnosis not present

## 2023-12-23 ENCOUNTER — Ambulatory Visit (INDEPENDENT_AMBULATORY_CARE_PROVIDER_SITE_OTHER)

## 2023-12-23 VITALS — Ht 67.0 in | Wt 191.0 lb

## 2023-12-23 DIAGNOSIS — Z0000 Encounter for general adult medical examination without abnormal findings: Secondary | ICD-10-CM | POA: Diagnosis not present

## 2023-12-23 NOTE — Patient Instructions (Signed)
 [name] , Thank you for taking time out of your busy schedule to complete your Annual Wellness Visit with me. I enjoyed our conversation and look forward to speaking with you again next year. I, as well as your care team,  appreciate your ongoing commitment to your health goals. Please review the following plan we discussed and let me know if I can assist you in the future. Your Game plan/ To Do List    Follow up Visits: We will see or speak with you next year for your Next Medicare AWV with our clinical staff Have you seen your provider in the last 6 months (3 months if uncontrolled diabetes)? Yes.  Last office visit on 11/17/2023  Clinician Recommendations:  Aim for 30 minutes of exercise or brisk walking, 6-8 glasses of water , and 5 servings of fruits and vegetables each day. Keep up the good work.      This is a list of the screenings recommended for you:  Health Maintenance  Topic Date Due   Medicare Annual Wellness Visit  11/05/2023   Flu Shot  11/20/2023   COVID-19 Vaccine (5 - 2025-26 season) 12/21/2023   DTaP/Tdap/Td vaccine (3 - Td or Tdap) 02/24/2029   Pneumococcal Vaccine for age over 78  Completed   Zoster (Shingles) Vaccine  Completed   HPV Vaccine  Aged Out   Meningitis B Vaccine  Aged Out   Colon Cancer Screening  Discontinued   Hepatitis C Screening  Discontinued    Advanced directives: (Copy Requested) Please bring a copy of your health care power of attorney and living will to the office to be added to your chart at your convenience. You can mail to [facility] [address] [place], [place] [address] or email to [email or web address] Advance Care Planning is important because it:  [x]  Makes sure you receive the medical care that is consistent with your values, goals, and preferences  [x]  It provides guidance to your family and loved ones and reduces their decisional burden about whether or not they are making the right decisions based on  your wishes.  Follow the link provided in your after visit summary or read over the paperwork we have mailed to you to help you started getting your Advance Directives in place. If you need assistance in completing these, please reach out to us  so that we can help you!  See attachments for Preventive Care and Fall Prevention Tips.

## 2023-12-23 NOTE — Progress Notes (Signed)
 Subjective:   Daniel Tucker is a 83 y.o. who presents for a Medicare Wellness preventive visit.  As a reminder, Annual Wellness Visits don't include a physical exam, and some assessments may be limited, especially if this visit is performed virtually. We may recommend an in-person follow-up visit with your provider if needed.  Visit Complete: Virtual I connected with  Carlin [name] on 12/23/23 by a audio enabled telemedicine application and verified that I am speaking with the correct person using two identifiers.  Patient Location: Home  Provider Location: Home Office  I discussed the limitations of evaluation and management by telemedicine. The patient expressed understanding and agreed to proceed.  Vital Signs: Because this visit was a virtual/telehealth visit, some criteria may be missing or patient reported. Any vitals not documented were not able to be obtained and vitals that have been documented are patient reported.  VideoDeclined- This patient declined Librarian, academic. Therefore the visit was completed with audio only.  Persons Participating in Visit: Patient.  AWV Questionnaire: No: Patient Medicare AWV questionnaire was not completed prior to this visit.  Cardiac Risk Factors include: advanced age (>36men, >2 women);hypertension;male gender;Other (see comment)     Objective:    Today's Vitals   12/23/23 1500  Weight: 191 lb (86.6 kg)  Height: 5' 7 (1.702 m)   Body mass index is 29.91 kg/m.     12/23/2023    3:08 PM 11/05/2022    4:29 PM 08/24/2022    9:04 PM 03/25/2021    1:57 PM 01/11/2020   11:43 AM 05/17/2018    1:00 PM 05/12/2018    1:51 PM  Advanced Directives  Does Patient Have a Medical Advance Directive? Yes Yes No No No No  No   Type of Estate agent of [place];Living will Healthcare Power of [place];Living will       Does patient want to make changes to medical advance directive?  No -  Patient declined   No - Patient declined    Copy of Healthcare Power of Attorney in Chart? No - copy requested No - copy requested       Would patient like information on creating a medical advance directive?   No - Patient declined No - Patient declined No - Patient declined No - Patient declined  No - Patient declined      Data saved with a previous flowsheet row definition    Current Medications (verified) Outpatient Encounter Medications as of 12/23/2023  Medication Sig   acetaminophen  (TYLENOL ) 500 MG tablet Take 500 mg by mouth as needed for moderate pain (pain score 4-6) or headache.   acyclovir  (ZOVIRAX ) 200 MG capsule TAKE 2 CAPSULES EVERY DAY   amLODipine  (NORVASC ) 10 MG tablet Take 1 tablet (10 mg total) by mouth daily.   Ascorbic Acid (VITAMIN C) 1000 MG tablet Take 1,000 mg by mouth daily.   docusate sodium  (COLACE) 100 MG capsule Take 100 mg by mouth 2 (two) times daily.    dofetilide  (TIKOSYN ) 500 MCG capsule TAKE 1 CAPSULE TWICE DAILY   ELIQUIS  5 MG TABS tablet TAKE 1 TABLET TWICE DAILY   GLUCOSAMINE SULFATE PO Take 2 tablets by mouth daily.    Melatonin 3 MG CAPS Take 1 capsule by mouth at bedtime.   metoprolol  tartrate (LOPRESSOR ) 25 MG tablet TAKE 1/2 TABLET TWICE DAILY   Multiple Vitamin (MULTIVITAMIN) tablet Take 1 tablet by mouth daily.   Multiple Vitamins-Minerals (PRESERVISION AREDS 2 PO) Take 1  capsule by mouth 2 (two) times daily.    potassium chloride  SA (KLOR-CON  M) 20 MEQ tablet TAKE 2 TABLETS TWICE DAILY   pravastatin  (PRAVACHOL ) 20 MG tablet TAKE 1 TABLET EVERY DAY   sildenafil (VIAGRA) 100 MG tablet Take 100 mg by mouth daily as needed for erectile dysfunction. (Patient taking differently: Take 100 mg by mouth as needed for erectile dysfunction.)   No facility-administered encounter medications on file as of 12/23/2023.    Allergies (verified) Patient has no known allergies.   History: Past Medical History:  Diagnosis Date   Arthritis    hips  (11/20/2014)   Benign essential HTN    Fasting hyperglycemia 2006   FBS 111; normal A1c   Hepatitis ~ 1955   yellow juandice   Hyperlipidemia    Internal hemorrhoids    Persistent atrial fibrillation (HCC) 08/24/2014   CHASD2VASC score is 3 - on Apixaban    Sleep apnea    intolerant to CPAP and now using oral device followed by Dr. 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   Dr Hiram   TOTAL HIP ARTHROPLASTY Right 05/17/2018   Procedure: TOTAL HIP ARTHROPLASTY ANTERIOR APPROACH;  Surgeon: Melodi Lerner, MD;  Location: [facility] ORS;  Service: Orthopedics;  Laterality: Right;   UVULOPALATOPHARYNGOPLASTY  1997   Dr.Woliki   Family History  Problem Relation Age of Onset   Hypertension Mother    Congestive Heart Failure Mother    Prostate cancer Father    Hypertension Father    Melanoma Sister    Lupus Sister    Prostate cancer Brother        mets to liver   Liver cancer Brother    Atrial fibrillation Brother    Melanoma Brother    Diabetes Neg Hx    Heart disease Neg Hx    Stroke Neg Hx    Colon cancer Neg Hx    Stomach cancer Neg Hx    Social History   Socioeconomic History   Marital status: Married    Spouse name: Iris   Number of children: 3   Years of education: Not on file   Highest education level: Not on file  Occupational History   Occupation: retired   Tobacco Use   Smoking status: Former    Current packs/day: 0.00    Average packs/day: 1 pack/day for 7.0 years (7.0 ttl pk-yrs)    Types: Cigarettes    Start  date: 49    Quit date: 1968    Years since quitting: 57.7   Smokeless tobacco: Never   Tobacco comments:    smoked cigarettes age [age], up to 1ppd  Vaping Use   Vaping status: Never Used  Substance and Sexual Activity   Alcohol use: Yes    Alcohol/week: 2.0 standard drinks of alcohol    Types: 1 Glasses of wine, 1 Cans of beer per week    Comment: less than 1 drink per week, sometimes none   Drug use: No   Sexual  activity: Yes  Other Topics Concern   Not on file  Social History Narrative   Right Handed   1-2 Cups of Coffee per Day   1 Soda per Day      Lives [facility]/2025   Social Drivers of Health   Financial Resource Strain: Low Risk  (12/23/2023)   Overall Financial Resource Strain (CARDIA)    Difficulty of Paying Living Expenses: Not hard at all  Food Insecurity: No Food Insecurity (12/23/2023)   Hunger Vital Sign    Worried About Running Out of Food in the Last Year: Never true    Ran Out of Food in the Last Year: Never true  Transportation Needs: No Transportation Needs (12/23/2023)   PRAPARE - Administrator, Civil Service (Medical): No    Lack of Transportation (Non-Medical): No  Physical Activity: Insufficiently Active (12/23/2023)   Exercise Vital Sign    Days of Exercise per Week: 2 days    Minutes of Exercise per Session: 20 min  Stress: No Stress Concern Present (12/23/2023)   [employer] of Occupational Health - Occupational Stress Questionnaire    Feeling of Stress: Not at all  Social Connections: Socially Integrated (12/23/2023)   Social Connection and Isolation Panel    Frequency of Communication with Friends and Family: More than three times a week    Frequency of Social Gatherings with Friends and Family: Once a week    Attends Religious Services: More than 4 times per year    Active Member of [employer] or Organizations: Yes    Attends Banker Meetings: Never    Marital Status: Married    Tobacco Counseling Counseling  given: Not Answered Tobacco comments: smoked cigarettes age 50-25, up to 1ppd    Clinical Intake:  Pre-visit preparation completed: Yes  Pain : No/denies pain     BMI - recorded: 29.91 Nutritional Status: BMI 25 -29 Overweight Nutritional Risks: None Diabetes: No  Lab Results  Component Value Date   HGBA1C 6.3 11/18/2023   HGBA1C 5.8 (H) 01/11/2020   HGBA1C 5.9 07/11/2019     How often do you need to have someone help you when you read instructions, pamphlets, or other written materials from your doctor or pharmacy?: 1 - Never  Interpreter Needed?: No  Information entered by :: Venida Tsukamoto, RMA   Activities of Daily Living     12/23/2023    3:01 PM  In your present state of health, do you have any difficulty performing the following activities:  Hearing? 0  Vision? 0  Difficulty concentrating or making decisions? 0  Walking or climbing stairs? 0  Dressing or bathing? 0  Doing errands, shopping? 0  Preparing Food and eating ? N  Using the Toilet? N  In the past six months, have you accidently leaked urine? N  Do you have problems with loss of bowel control? N  Managing your Medications? N  Managing your Finances? N  Housekeeping or managing your Housekeeping? N    Patient Care Team: Geofm Glade PARAS, MD as PCP - General (Internal Medicine) Melodi Lerner, MD as Consulting Physician (Orthopedic Surgery) Joshua Dalton MATSU, CMA as Technician Carlie Clark, MD as Consulting Physician (Otolaryngology) Aneita Gwendlyn DASEN, MD (Inactive) as Consulting Physician (Gastroenterology) Kelsie Agent, MD (Inactive) as Consulting Physician (Cardiology) Fate Morna SAILOR, [facility] (Inactive) as Pharmacist (Pharmacist) Raelyn Betters as Consulting Physician (Optometry)  I have updated your Care Teams any recent Medical Services you may have received from other providers  in the past year.     Assessment:   This is a routine wellness examination for [place].  Hearing/Vision  screen Hearing Screening - Comments:: Denies hearing difficulties   Vision Screening - Comments:: Wears eyeglasses/[facility]/Dr. Raelyn   Goals Addressed             This Visit's Progress    Continue with my home projects, enjoy life, family, and church   On track      Depression Screen     12/23/2023    3:18 PM 11/18/2023   10:12 AM 11/05/2022    4:38 PM 08/29/2022    1:16 PM 12/24/2021    3:32 PM 03/25/2021    1:50 PM 01/11/2020   11:38 AM  PHQ 2/9 Scores  PHQ - 2 Score 0 0 0 0 0 0 0  PHQ- 9 Score 2 2  0 0      Fall Risk     12/23/2023    3:08 PM 11/18/2023   10:12 AM 11/05/2022    4:29 PM 08/29/2022    1:16 PM 12/24/2021    4:39 PM  Fall Risk   Falls in the past year? 1 1 0 0 0  Number falls in past yr: 0 1 0 0 0  Injury with Fall? -- 1 0 0 0  Comment muscle strain      Risk for fall due to : Impaired balance/gait No Fall Risks No Fall Risks No Fall Risks No Fall Risks  Follow up Falls evaluation completed;Falls prevention discussed Falls evaluation completed Falls evaluation completed Falls evaluation completed Falls evaluation completed      Data saved with a previous flowsheet row definition    MEDICARE RISK AT HOME:  Medicare Risk at Home Any stairs in or around the home?: No If so, are there any without handrails?: No Home free of loose throw rugs in walkways, pet beds, electrical cords, etc?: Yes Adequate lighting in your home to reduce risk of falls?: Yes Life alert?: No Use of a cane, walker or w/c?: No Grab bars in the bathroom?: Yes Shower chair or bench in shower?: Yes Elevated toilet seat or a handicapped toilet?: Yes  TIMED UP AND GO:  Was the test performed?  No  Cognitive Function: Declined/Normal: No cognitive concerns noted by patient or family. Patient alert, oriented, able to answer questions appropriately and recall recent events. No signs of memory loss or confusion.        11/05/2022    4:38 PM 01/11/2020   11:45 AM  6CIT Screen  What  Year? 0 points 0 points  What month? 0 points 0 points  What time? 0 points 0 points  Count back from 20 0 points 0 points  Months in reverse 0 points 0 points  Repeat phrase 0 points 0 points  Total Score 0 points 0 points    Immunizations Immunization History  Administered Date(s) Administered   Fluad Quad(high Dose 65+) 01/22/2021   INFLUENZA, HIGH DOSE SEASONAL PF 02/15/2013, 01/26/2014, 02/16/2015, 01/16/2016, 01/01/2017, 01/03/2019, 12/30/2019   Influenza Split 03/18/2011   Influenza Whole 01/14/2010   Influenza,inj,quad, With Preservative 01/13/2018   PFIZER(Purple Top)SARS-COV-2 Vaccination 05/27/2019, 06/22/2019, 01/25/2020   Pfizer Covid-19 Vaccine Bivalent Booster 55yrs & up 01/22/2021   Pneumococcal Conjugate-13 08/24/2014   Pneumococcal Polysaccharide-23 02/04/2010   Td 11/08/2009   Tdap 02/25/2019   Zoster Recombinant(Shingrix) 01/13/2018, 03/25/2018    Screening Tests Health Maintenance  Topic Date Due   INFLUENZA VACCINE  11/20/2023  COVID-19 Vaccine (5 - 2025-26 season) 12/21/2023   Medicare Annual Wellness (AWV)  12/22/2024   DTaP/Tdap/Td (3 - Td or Tdap) 02/24/2029   Pneumococcal Vaccine: 50+ Years  Completed   Zoster Vaccines- Shingrix  Completed   HPV VACCINES  Aged Out   Meningococcal B Vaccine  Aged Out   Colonoscopy  Discontinued   Hepatitis C Screening  Discontinued    Health Maintenance  Health Maintenance Due  Topic Date Due   INFLUENZA VACCINE  11/20/2023   COVID-19 Vaccine (5 - 2025-26 season) 12/21/2023   Health Maintenance Items Addressed: See Nurse Notes at the end of this note  Additional Screening:  Vision Screening: Recommended annual ophthalmology exams for early detection of glaucoma and other disorders of the eye. Would you like a referral to an eye doctor? No    Dental Screening: Recommended annual dental exams for proper oral hygiene  Community Resource Referral / Chronic Care Management: CRR required this visit?  No    CCM required this visit?  No   Plan:    I have personally reviewed and noted the following in the patient's chart:   Medical and social history Use of alcohol, tobacco or illicit drugs  Current medications and supplements including opioid prescriptions. Patient is not currently taking opioid prescriptions. Functional ability and status Nutritional status Physical activity Advanced directives List of other physicians Hospitalizations, surgeries, and ER visits in previous 12 months Vitals Screenings to include cognitive, depression, and falls Referrals and appointments  In addition, I have reviewed and discussed with patient certain preventive protocols, quality metrics, and best practice recommendations. A written personalized care plan for preventive services as well as general preventive health recommendations were provided to patient.   Kyser Wandel L Sereen Schaff, CMA   12/23/2023   After Visit Summary: (MyChart) Due to this being a telephonic visit, the after visit summary with patients personalized plan was offered to patient via MyChart   Notes: Nothing significant to report at this time.

## 2024-01-01 ENCOUNTER — Other Ambulatory Visit (HOSPITAL_COMMUNITY): Payer: Self-pay | Admitting: Internal Medicine

## 2024-01-01 DIAGNOSIS — I480 Paroxysmal atrial fibrillation: Secondary | ICD-10-CM

## 2024-01-26 DIAGNOSIS — Z23 Encounter for immunization: Secondary | ICD-10-CM | POA: Diagnosis not present

## 2024-02-08 ENCOUNTER — Other Ambulatory Visit (HOSPITAL_COMMUNITY): Payer: Self-pay | Admitting: Internal Medicine

## 2024-03-02 DIAGNOSIS — H2511 Age-related nuclear cataract, right eye: Secondary | ICD-10-CM | POA: Diagnosis not present

## 2024-03-02 DIAGNOSIS — H5371 Glare sensitivity: Secondary | ICD-10-CM | POA: Diagnosis not present

## 2024-03-03 DIAGNOSIS — H2512 Age-related nuclear cataract, left eye: Secondary | ICD-10-CM | POA: Diagnosis not present

## 2024-03-04 ENCOUNTER — Telehealth: Payer: Self-pay | Admitting: Neurology

## 2024-03-04 NOTE — Telephone Encounter (Signed)
 Pt wife called  requesting to speak to MD pt is having some tuff nights and Melatonin 3 MG CAPS  is not working . Pt wife states she would like to discuss  medication option or treatment for PT
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
## 2024-03-07 NOTE — Telephone Encounter (Signed)
 Called wife at [phone]. LVM

## 2024-03-07 NOTE — Telephone Encounter (Signed)
 Pt called returning call , Informed Nurse will call back

## 2024-03-07 NOTE — Telephone Encounter (Signed)
 Pt last saw Amy L,NP 07/20/23.  On BIPAP (recent report below).  Plan below from last visit.   I found note from Dr. Chalice 02/03/22:

## 2024-03-07 NOTE — Telephone Encounter (Signed)
 Patient's wife calling regarding message left on Friday 03/04/24.

## 2024-03-08 MED ORDER — MELATONIN 5 MG/15ML PO LIQD: 5.0000 mg | Freq: Every day | ORAL | 0 refills | Status: AC

## 2024-03-08 NOTE — Telephone Encounter (Signed)
 REM BD in advanced age : There is no need to prescribe 5 mg melatonin, it is available over the counter. I will put it on his med list though, and it will be liquid ( a teaspoon full) .  It is a good idea to 5 mg before going to prescription drugs .    Plan B )  SSRI ( zoloft/ effexor/ lexapro)   Plan C : klonopin.

## 2024-03-09 NOTE — Telephone Encounter (Signed)
 Call to wife and reviewed Dr. Chalice advice to increase to 5mg  melatonin at bedtime and it was available over the counter. Wife verbalized understanding.

## 2024-03-09 NOTE — Telephone Encounter (Signed)
 Pt's wife has called to f/u re: why she hasn't heard from RN re: if there should be an increase of pt's Melatonin 5 MG/15ML LIQD .  Phone rep informed wife that Dr Chalice may be waiting on a response from pt's PCP.  Pt's wife accepted this as a response, she would like to be called once Dr Chalice has made a decision.

## 2024-03-30 ENCOUNTER — Ambulatory Visit (HOSPITAL_COMMUNITY): Admitting: Internal Medicine

## 2024-03-30 DIAGNOSIS — H2512 Age-related nuclear cataract, left eye: Secondary | ICD-10-CM | POA: Diagnosis not present

## 2024-03-30 DIAGNOSIS — H5371 Glare sensitivity: Secondary | ICD-10-CM | POA: Diagnosis not present

## 2024-04-05 ENCOUNTER — Ambulatory Visit (HOSPITAL_COMMUNITY)
Admission: RE | Admit: 2024-04-05 | Discharge: 2024-04-05 | Disposition: A | Source: Ambulatory Visit | Attending: Internal Medicine | Admitting: Internal Medicine

## 2024-04-05 ENCOUNTER — Encounter (HOSPITAL_COMMUNITY): Payer: Self-pay | Admitting: Internal Medicine

## 2024-04-05 VITALS — BP 122/80 | HR 58 | Ht 67.0 in | Wt 196.8 lb

## 2024-04-05 DIAGNOSIS — Z79899 Other long term (current) drug therapy: Secondary | ICD-10-CM | POA: Diagnosis present

## 2024-04-05 DIAGNOSIS — Z5181 Encounter for therapeutic drug level monitoring: Secondary | ICD-10-CM | POA: Diagnosis present

## 2024-04-05 DIAGNOSIS — D6869 Other thrombophilia: Secondary | ICD-10-CM | POA: Diagnosis present

## 2024-04-05 DIAGNOSIS — I480 Paroxysmal atrial fibrillation: Secondary | ICD-10-CM | POA: Insufficient documentation

## 2024-04-05 NOTE — Progress Notes (Addendum)
 Patient ID: Daniel Tucker, male   DOB: Aug 15, 1940, 83 y.o.   MRN: [id number]     Primary Care Physician: Geofm Glade PARAS, MD Referring Physician: Dr. [name] is a 83 y.o. male with a h/o paroxysmal afib maintaining SR on tikosyn . He is in the afib clinic for f/u surveillance of Tikosyn . He did go thru a hip replacement in February 2020 without any issues. Short episode  afib lst week that responded well to extra dose of BB. Pending 2nd covid shot 3/3. Qtc is stable.   F/u in the afib clinic, 8/26, for surveillance ofTikosyn. He forgot his Tikosyn  a couple of weeks ago and did have breakthrough afib.He did convert the next day. No further  afib.   F/u in afib clinic 10/03/20. He states that he has been staying in SR. Rare episode of afib that resolves with extra metoprolol . He has lost some weight as he got a new puppy and is walking outside with it frequently. No issues with anticoagulation.   F/u in afib clinic, 12/19, no afib to report. His BP's at home are typically running 140-150 systolic. BP is  150/82 bpm . Discussed increasing amlodipine  to 10 mg daily to better control BP. He is compliant with meds.   F/u afib clinic, 10/09/21 for tikosyn  surveillance. He is in SR, one episode of  afib to report. Took extra BB and was back in SR in 1-2 hours. Otherwise, no concerns. Qt stable with dofetilide .  F/u in afib clinic, 04/23/22 for Tikosyn  surveillance. He reports that he is doing well. No afib noted. No change in health. No issue with anticoagulation.  F/u in Afib clinic, 10/28/22 for Tikosyn  surveillance. He is currently in NSR. No episodes of Afib to report. Patient takes Tikosyn  500 mcg BID. No missed doses of Tikosyn  or Eliquis .   F/u in Afib clinic, 04/30/23 for Tikosyn  surveillance. He is currently in NSR. No episodes of Afib since last office visit. He has completed his move to [facility] and is almost completely settled. He is taking Tikosyn  500 mcg BID. No missed  doses of Tikosyn  or Eliquis .  Follow up in Afib clinic for Tikosyn  surveillance, 10/29/23. He is currently in NSR. He has had no episodes of Afib since last office visit. He is doing well overall. His wife is a patient here being admitted for Tikosyn  next week. No missed doses of Tikosyn  or Eliquis .   Follow-up 04/05/2024 for Tikosyn  surveillance.  He appears to be maintaining SR.  No missed doses of Tikosyn .  His wife, who is also a patient here, began physical therapy last week secondary to ground level fall. They have acclimated to [facility] for the most part and have gotten used to the routine there. No bleeding issues on Eliquis .  Today, he denies symptoms of palpitations, chest pain, shortness of breath, orthopnea, PND, lower extremity edema, dizziness, presyncope, syncope, or neurologic sequela. The patient is tolerating medications without difficulties and is otherwise without complaint today.   Past Medical History:  Diagnosis Date   Arthritis    hips (11/20/2014)   Benign essential HTN    Fasting hyperglycemia 2006   FBS 111; normal A1c   Hepatitis ~ 1955   yellow juandice   Hyperlipidemia    Internal hemorrhoids    Persistent atrial fibrillation (HCC) 08/24/2014   CHASD2VASC score is 3 - on Apixaban    Sleep apnea    intolerant to CPAP and now using oral device followed by  Dr. Melba   Tubular adenoma of colon 2006   Dr Aneita   Past Surgical History:  Procedure Laterality Date   CARDIOVERSION N/A 10/12/2014   Procedure: CARDIOVERSION;  Surgeon: Debby Gallop, MD;  Location: [facility] ENDOSCOPY;  Service: Cardiovascular;  Laterality: N/A;   COLONOSCOPY  2011   no polyp   COLONOSCOPY W/ POLYPECTOMY  2006   [place]   NASAL SINUS SURGERY  1997   PROSTATE BIOPSY  2010   Negative,Dr.Ottelin   TONSILLECTOMY AND ADENOIDECTOMY  1997   TOTAL HIP ARTHROPLASTY Left 08/2010   Dr Hiram   TOTAL HIP ARTHROPLASTY Right 05/17/2018   Procedure: TOTAL HIP ARTHROPLASTY ANTERIOR  APPROACH;  Surgeon: Melodi Lerner, MD;  Location: [facility] ORS;  Service: Orthopedics;  Laterality: Right;   UVULOPALATOPHARYNGOPLASTY  1997   Dr.Woliki    Current Outpatient Medications  Medication Sig Dispense Refill   acetaminophen  (TYLENOL ) 500 MG tablet Take 500 mg by mouth as needed for moderate pain (pain score 4-6) or headache.     acyclovir  (ZOVIRAX ) 200 MG capsule TAKE 2 CAPSULES EVERY DAY 60 capsule 11   amLODipine  (NORVASC ) 10 MG tablet TAKE 1 TABLET EVERY DAY 90 tablet 3   Ascorbic Acid (VITAMIN C) 1000 MG tablet Take 1,000 mg by mouth daily.     docusate sodium  (COLACE) 100 MG capsule Take 100 mg by mouth 2 (two) times daily.      dofetilide  (TIKOSYN ) 500 MCG capsule TAKE 1 CAPSULE TWICE DAILY 180 capsule 1   ELIQUIS  5 MG TABS tablet TAKE 1 TABLET TWICE DAILY 180 tablet 3   GLUCOSAMINE SULFATE PO Take 2 tablets by mouth daily.      Melatonin 5 MG/15ML LIQD Take 15 mLs (5 mg total) by mouth at bedtime. 450 mL 0   metoprolol  tartrate (LOPRESSOR ) 25 MG tablet TAKE 1/2 TABLET TWICE DAILY 90 tablet 1   Multiple Vitamin (MULTIVITAMIN) tablet Take 1 tablet by mouth daily.     Multiple Vitamins-Minerals (PRESERVISION AREDS 2 PO) Take 1 capsule by mouth 2 (two) times daily.      potassium chloride  SA (KLOR-CON  M) 20 MEQ tablet TAKE 2 TABLETS TWICE DAILY 360 tablet 1   pravastatin  (PRAVACHOL ) 20 MG tablet TAKE 1 TABLET EVERY DAY 30 tablet 11   sildenafil (VIAGRA) 100 MG tablet Take 100 mg by mouth daily as needed for erectile dysfunction.     No current facility-administered medications for this encounter.    No Known Allergies  ROS- All systems are reviewed and negative except as per the HPI above  Physical Exam: Vitals:   04/05/24 1348  BP: 122/80  Pulse: (!) 58  Weight: 89.3 kg  Height: 5' 7 (1.702 m)    GEN- The patient is well appearing, alert and oriented x 3 today.   Neck - no JVD or carotid bruit noted Lungs- Clear to ausculation bilaterally, normal work of  breathing Heart- Regular rate and rhythm, no murmurs, rubs or gallops, PMI not laterally displaced Extremities- no clubbing, cyanosis, or edema Skin - no rash or ecchymosis noted   EKG- EKG Interpretation Date/Time:  Tuesday April 05 2024 13:50:44 EST Ventricular Rate:  58 PR Interval:  166 QRS Duration:  134 QT Interval:  454 QTC Calculation: 445 R Axis:   -39  Text Interpretation: Sinus bradycardia Left axis deviation Right bundle branch block Minimal voltage criteria for LVH, may be normal variant ( R in aVL ) Abnormal ECG When compared with ECG of 29-Oct-2023 11:42, No significant change was found Confirmed  by Terra Pac [phone]) on 04/05/2024 2:03:20 PM    ECHO 09/11/14: Study Conclusions   - Left ventricle: The cavity size was mildly dilated. Wall    thickness was normal. Systolic function was normal. The estimated    ejection fraction was in the range of 55% to 60%. Wall motion was    normal; there were no regional wall motion abnormalities.  - Mitral valve: There was mild regurgitation.  - Left atrium: The atrium was moderately to severely dilated.  - Right atrium: The atrium was moderately dilated.  - Pulmonary arteries: Systolic pressure was mildly increased. PA    peak pressure: 42 mm Hg (S).   Assessment and Plan: 1. Paroxysmal Afib  Patient appears to be maintaining SR.  He is happy with current management overall.  We will not make any changes at this time.  High risk medication monitoring (ICD10: [id number]) Patient requires ongoing monitoring for anti-arrhythmic medication which has the potential to cause life threatening arrhythmias or AV block. Qtc stable. Continue Tikosyn  500 mcg twice daily. Basic metabolic panel and magnesium level drawn today.   2.OSA He is compliant with CPAP therapy.  3. HTN  Stable today.   Follow up in 6 months for Tikosyn  surveillance.    Pac Terra, PA-C Af[facility] [address] [place], [place] [address] [phone]

## 2024-04-06 ENCOUNTER — Ambulatory Visit (HOSPITAL_COMMUNITY): Payer: Self-pay | Admitting: Internal Medicine

## 2024-04-06 LAB — BASIC METABOLIC PANEL WITH GFR
BUN/Creatinine Ratio: 15 (ref 10–24)
BUN: 17 mg/dL (ref 8–27)
CO2: 26 mmol/L (ref 20–29)
Calcium: 9.6 mg/dL (ref 8.6–10.2)
Chloride: 102 mmol/L (ref 96–106)
Creatinine, Ser: 1.17 mg/dL (ref 0.76–1.27)
Glucose: 80 mg/dL (ref 70–99)
Potassium: 4.4 mmol/L (ref 3.5–5.2)
Sodium: 141 mmol/L (ref 134–144)
eGFR: 62 mL/min/1.73 (ref >59)

## 2024-04-06 LAB — MAGNESIUM: Magnesium: 2.1 mg/dL (ref 1.6–2.3)

## 2024-04-25 ENCOUNTER — Other Ambulatory Visit (HOSPITAL_COMMUNITY): Payer: Self-pay | Admitting: Internal Medicine

## 2024-07-19 ENCOUNTER — Ambulatory Visit: Admitting: Family Medicine

## 2024-10-04 ENCOUNTER — Ambulatory Visit (HOSPITAL_COMMUNITY): Admitting: Internal Medicine
